# Patient Record
Sex: Female | Born: 1970 | Race: White | Hispanic: No | Marital: Married | Smoking: Former smoker
Health system: Southern US, Community
[De-identification: ages and names within clinical notes are randomized; demographics above are authoritative.]

## PROBLEM LIST (undated history)

## (undated) DIAGNOSIS — Z9889 Other specified postprocedural states: Secondary | ICD-10-CM

## (undated) DIAGNOSIS — J45909 Unspecified asthma, uncomplicated: Secondary | ICD-10-CM

## (undated) DIAGNOSIS — F419 Anxiety disorder, unspecified: Secondary | ICD-10-CM

## (undated) DIAGNOSIS — D0512 Intraductal carcinoma in situ of left breast: Secondary | ICD-10-CM

## (undated) DIAGNOSIS — Z923 Personal history of irradiation: Secondary | ICD-10-CM

## (undated) DIAGNOSIS — Z973 Presence of spectacles and contact lenses: Secondary | ICD-10-CM

## (undated) DIAGNOSIS — R112 Nausea with vomiting, unspecified: Secondary | ICD-10-CM

## (undated) DIAGNOSIS — G2581 Restless legs syndrome: Secondary | ICD-10-CM

## (undated) DIAGNOSIS — F411 Generalized anxiety disorder: Secondary | ICD-10-CM

## (undated) DIAGNOSIS — G43909 Migraine, unspecified, not intractable, without status migrainosus: Secondary | ICD-10-CM

## (undated) DIAGNOSIS — IMO0002 Reserved for concepts with insufficient information to code with codable children: Secondary | ICD-10-CM

## (undated) DIAGNOSIS — R55 Syncope and collapse: Secondary | ICD-10-CM

## (undated) DIAGNOSIS — M2550 Pain in unspecified joint: Secondary | ICD-10-CM

## (undated) DIAGNOSIS — T7840XA Allergy, unspecified, initial encounter: Secondary | ICD-10-CM

## (undated) HISTORY — DX: Restless legs syndrome: G25.81

## (undated) HISTORY — DX: Allergy, unspecified, initial encounter: T78.40XA

## (undated) HISTORY — PX: BREAST EXCISIONAL BIOPSY: SUR124

## (undated) HISTORY — PX: KNEE ARTHROSCOPY: SUR90

## (undated) HISTORY — PX: WISDOM TOOTH EXTRACTION: SHX21

## (undated) HISTORY — PX: BREAST LUMPECTOMY: SHX2

## (undated) HISTORY — PX: MM BREAST STEREO BIOPSY LEFT (ARMC HX): HXRAD1824

## (undated) HISTORY — DX: Anxiety disorder, unspecified: F41.9

## (undated) HISTORY — PX: KNEE SURGERY: SHX244

## (undated) HISTORY — DX: Reserved for concepts with insufficient information to code with codable children: IMO0002

---

## 1988-07-09 HISTORY — PX: ANTERIOR CRUCIATE LIGAMENT REPAIR: SHX115

## 1997-12-14 ENCOUNTER — Other Ambulatory Visit: Admission: RE | Admit: 1997-12-14 | Discharge: 1997-12-14 | Payer: Self-pay | Admitting: Obstetrics and Gynecology

## 1998-08-30 ENCOUNTER — Other Ambulatory Visit: Admission: RE | Admit: 1998-08-30 | Discharge: 1998-08-30 | Payer: Self-pay | Admitting: Obstetrics and Gynecology

## 1999-09-01 ENCOUNTER — Other Ambulatory Visit: Admission: RE | Admit: 1999-09-01 | Discharge: 1999-09-01 | Payer: Self-pay | Admitting: Gynecology

## 2000-09-12 ENCOUNTER — Other Ambulatory Visit: Admission: RE | Admit: 2000-09-12 | Discharge: 2000-09-12 | Payer: Self-pay | Admitting: Gynecology

## 2001-09-12 ENCOUNTER — Other Ambulatory Visit: Admission: RE | Admit: 2001-09-12 | Discharge: 2001-09-12 | Payer: Self-pay | Admitting: Gynecology

## 2002-02-02 ENCOUNTER — Other Ambulatory Visit: Admission: RE | Admit: 2002-02-02 | Discharge: 2002-02-23 | Payer: Self-pay | Admitting: Obstetrics and Gynecology

## 2002-03-26 ENCOUNTER — Encounter: Payer: Self-pay | Admitting: Emergency Medicine

## 2002-03-26 ENCOUNTER — Emergency Department (HOSPITAL_COMMUNITY): Admission: AC | Admit: 2002-03-26 | Discharge: 2002-03-27 | Payer: Self-pay

## 2002-06-24 ENCOUNTER — Ambulatory Visit (HOSPITAL_COMMUNITY): Admission: RE | Admit: 2002-06-24 | Discharge: 2002-06-24 | Payer: Self-pay | Admitting: Gynecology

## 2002-08-13 ENCOUNTER — Encounter (INDEPENDENT_AMBULATORY_CARE_PROVIDER_SITE_OTHER): Payer: Self-pay

## 2002-08-13 ENCOUNTER — Inpatient Hospital Stay (HOSPITAL_COMMUNITY): Admission: AD | Admit: 2002-08-13 | Discharge: 2002-08-15 | Payer: Self-pay | Admitting: Gynecology

## 2002-09-24 ENCOUNTER — Other Ambulatory Visit: Admission: RE | Admit: 2002-09-24 | Discharge: 2002-09-24 | Payer: Self-pay | Admitting: *Deleted

## 2003-10-08 ENCOUNTER — Other Ambulatory Visit: Admission: RE | Admit: 2003-10-08 | Discharge: 2003-10-08 | Payer: Self-pay | Admitting: Gynecology

## 2004-10-09 ENCOUNTER — Other Ambulatory Visit: Admission: RE | Admit: 2004-10-09 | Discharge: 2004-10-09 | Payer: Self-pay | Admitting: Gynecology

## 2005-08-14 ENCOUNTER — Other Ambulatory Visit: Admission: RE | Admit: 2005-08-14 | Discharge: 2005-08-14 | Payer: Self-pay | Admitting: Gynecology

## 2006-03-04 ENCOUNTER — Encounter (INDEPENDENT_AMBULATORY_CARE_PROVIDER_SITE_OTHER): Payer: Self-pay | Admitting: Specialist

## 2006-03-04 ENCOUNTER — Inpatient Hospital Stay (HOSPITAL_COMMUNITY): Admission: AD | Admit: 2006-03-04 | Discharge: 2006-03-06 | Payer: Self-pay | Admitting: Gynecology

## 2006-04-15 ENCOUNTER — Other Ambulatory Visit: Admission: RE | Admit: 2006-04-15 | Discharge: 2006-04-15 | Payer: Self-pay | Admitting: Gynecology

## 2007-04-21 ENCOUNTER — Other Ambulatory Visit: Admission: RE | Admit: 2007-04-21 | Discharge: 2007-04-21 | Payer: Self-pay | Admitting: Gynecology

## 2008-04-26 ENCOUNTER — Ambulatory Visit: Payer: Self-pay | Admitting: Women's Health

## 2008-04-26 ENCOUNTER — Other Ambulatory Visit: Admission: RE | Admit: 2008-04-26 | Discharge: 2008-04-26 | Payer: Self-pay | Admitting: Gynecology

## 2008-04-26 ENCOUNTER — Encounter: Payer: Self-pay | Admitting: Women's Health

## 2009-05-27 ENCOUNTER — Other Ambulatory Visit: Admission: RE | Admit: 2009-05-27 | Discharge: 2009-05-27 | Payer: Self-pay | Admitting: Gynecology

## 2009-05-27 ENCOUNTER — Ambulatory Visit: Payer: Self-pay | Admitting: Women's Health

## 2009-11-16 ENCOUNTER — Encounter: Admission: RE | Admit: 2009-11-16 | Discharge: 2009-11-16 | Payer: Self-pay | Admitting: Gynecology

## 2010-06-16 ENCOUNTER — Ambulatory Visit: Payer: Self-pay | Admitting: Women's Health

## 2010-06-16 ENCOUNTER — Other Ambulatory Visit
Admission: RE | Admit: 2010-06-16 | Discharge: 2010-06-16 | Payer: Self-pay | Source: Home / Self Care | Admitting: Gynecology

## 2010-10-23 ENCOUNTER — Other Ambulatory Visit: Payer: Self-pay | Admitting: Obstetrics and Gynecology

## 2010-10-23 DIAGNOSIS — Z1231 Encounter for screening mammogram for malignant neoplasm of breast: Secondary | ICD-10-CM

## 2010-10-26 ENCOUNTER — Other Ambulatory Visit: Payer: Self-pay | Admitting: Gynecology

## 2010-10-26 DIAGNOSIS — Z139 Encounter for screening, unspecified: Secondary | ICD-10-CM

## 2010-11-21 ENCOUNTER — Ambulatory Visit (HOSPITAL_COMMUNITY)
Admission: RE | Admit: 2010-11-21 | Discharge: 2010-11-21 | Disposition: A | Discharge status: Home / Self Care | Payer: BC Managed Care – PPO | Source: Ambulatory Visit | Attending: Gynecology | Admitting: Gynecology

## 2010-11-21 DIAGNOSIS — Z1231 Encounter for screening mammogram for malignant neoplasm of breast: Secondary | ICD-10-CM | POA: Insufficient documentation

## 2010-11-21 DIAGNOSIS — Z139 Encounter for screening, unspecified: Secondary | ICD-10-CM

## 2010-11-24 NOTE — H&P (Signed)
Pamela Martinez, Pamela Martinez              ACCOUNT NO.:  [id number]   MEDICAL RECORD NO.:  [id number]          PATIENT TYPE:  INP   LOCATION:  9164                          FACILITY:  [facility]   PHYSICIAN:  Ivor Costa. Farrel Gobble, M.D. DATE OF BIRTH:  06/12/1971   DATE OF ADMISSION:  03/04/2006  DATE OF DISCHARGE:                                HISTORY & PHYSICAL   CHIEF COMPLAINT:  Labor.   HISTORY OF PRESENT ILLNESS:  The patient is a 40 year old G2, P8 with  estimated date of confinement of March 07, 2006. Estimated gestational age  of 39-4/7 weeks, who presented to the office complaining of contractions for  the past hour and a half. The patient states that they are about every 2  minutes. Questionable leakage of fluid but no gush. Good fetal movement. No  vaginal bleeding. On speculum examination, a bulging bad was seen, however,  no pooling. She was Nitrazine negative but was slightly firm positive. She  was noted to be 2 to 3, 100%, minus 1, where her prior examination had been  1, 70, and minus 3. Based on that, the patient was admitted to labor and  delivery for management of active labor. Her pregnancy is complicated by  advanced maternal age. The patient elected not to have an amniocentesis. She  was also noted to be GBS positive. She is O positive, antibiotic negative,  RPR non-reactive, Rubella immune, hepatitis B surface antigen non-reactive.  HIV non-reactive. GBS positive. Refer to the [place].   PHYSICAL EXAMINATION:  GENERAL:  She is uncomfortable, gravida, in moderate  distress.  HEART:  Regular rate.  LUNGS:  Clear.  ABDOMEN:  Gravid with moderate contractions. Fetal heart tones were  auscultated.  PELVIC:  Vaginal examination, 2 to 3, 100%, minus 1, with a bag of water.  EXTREMITIES:  Negative.   ASSESSMENT:  Labor.   RECOMMENDATIONS:  The patient was directly admitted to labor and delivery  with orders for epidural p.r.n. Nubain p.r.n. and GBS prophylaxis as well as  routine orders were given.      Ivor Costa. Farrel Gobble, M.D.  Electronically Signed     THL/MEDQ  D:  03/04/2006  T:  03/04/2006  Job:  [id number]

## 2010-11-24 NOTE — H&P (Signed)
   NAME:  Pamela Martinez, Pamela Martinez                        ACCOUNT NO.:  [id number]   MEDICAL RECORD NO.:  [id number]                   PATIENT TYPE:   LOCATION:                                       FACILITY:   PHYSICIAN:  Katy Fitch, M.D.               DATE OF BIRTH:  [birth year]   DATE OF ADMISSION:  DATE OF DISCHARGE:                                HISTORY & PHYSICAL   CHIEF COMPLAINT:  Ruptured membranes.   HISTORY OF PRESENT ILLNESS:  The patient is a 40 year old G1 P0 at 37 weeks  and four days who presents to the office for a routine appointment  complaining of a gush of fluid.  The patient denies any vaginal bleeding,  fevers, nausea, vomiting, chills, change in bowel or bladder habits.  The  patient has good fetal movement.  Upon examination the patient has gross  amniotic fluid coming from the cervix which appeared to be closed and thick,  and good fetal heart tones were noted.   PAST MEDICAL HISTORY:  None.   PAST SURGICAL HISTORY:  None.   MEDICATIONS:  Prenatal vitamins.   ALLERGIES:  No known drug allergies.   FAMILY HISTORY:  Without mental retardation or epithelial cancers.   PHYSICAL EXAMINATION:  VITAL SIGNS:  Blood pressure 122/70.  HEENT:  Throat clear.  LUNGS:  Clear to auscultation bilaterally.  HEART:  Regular rate and rhythm.  ABDOMEN:  Gravid, nontender.  Estimated fetal weight 7 pounds 2 ounces.  PELVIC:  Cervix closed, thick, and high.  Positive GSB.   ASSESSMENT AND PLAN:  A 40 year old G1 P0 at 45 and four with ruptured  membranes.  Will send her over to the hospital for observation.  The patient  will be started on penicillin for GBS and then started on Pitocin if she  does not go into labor within 12-24 hours.                                               Katy Fitch, M.D.    DC/MEDQ  D:  08/13/2002  T:  08/13/2002  Job:  [id number]

## 2010-11-24 NOTE — Discharge Summary (Signed)
   NAMERYLYNNE, Pamela Martinez                        ACCOUNT NO.:  [id number]   MEDICAL RECORD NO.:  [id number]                   PATIENT TYPE:  OUT   LOCATION:  VASC                                 FACILITY:  [facility]   PHYSICIAN:  Ivor Costa. Farrel Gobble, M.D.              DATE OF BIRTH:  30-Dec-1970   DATE OF ADMISSION:  08/13/2002  DATE OF DISCHARGE:  08/15/2002                                 DISCHARGE SUMMARY   DISCHARGE DIAGNOSES:  1. Intrauterine pregnancy at 37 weeks, delivered.  Status post spontaneous     vaginal delivery.  2. Positive Group B Strep.   HISTORY OF PRESENT ILLNESS:  The patient is a 40 year old female, gravida 1,  para 0, with an EDC of 08/30/02.  Prenatal course had been complicated by the  patient being 32 at the time of delivery, but the father of the baby being  27.  The patient deferred amniocentesis.  Positive Group B Strep.  The  patient was seen in the office on the day of admission and diagnosed with  rupture of membranes at 37-4/7 weeks, and was sent to [facility].   HOSPITAL COURSE:  The patient was admitted on 08/13/02, for rupture of  membranes.  Put on IV penicillin for Group B Strep prophylaxis.  On 08/13/02,  at 2039, underwent delivery of a female with Agpars of 9 and 9, weight of 7  pounds 4 ounces.  There were no complications.  Postpartum, the patient  remained afebrile, in stable condition.  She was discharged to home on  08/15/02, with ______ postpartum booklet.   LABORATORY DATA:  The patient is O positive, rubella immune, hemoglobin on  08/14/02, was 7.9.   DISPOSITION:  The patient is discharged to home.  Return in six weeks.  If  any problems prior to that time, to see me in the office.   DISCHARGE MEDICATIONS:  Percocet p.r.n. pain.      Susa Loffler, P.A.                    Ivor Costa. Farrel Gobble, M.D.    TSG/MEDQ  D:  09/11/2002  T:  09/12/2002  Job:  [id number]

## 2011-01-18 ENCOUNTER — Ambulatory Visit (INDEPENDENT_AMBULATORY_CARE_PROVIDER_SITE_OTHER): Payer: BC Managed Care – PPO | Admitting: Gynecology

## 2011-01-18 DIAGNOSIS — R5381 Other malaise: Secondary | ICD-10-CM

## 2011-01-18 DIAGNOSIS — L659 Nonscarring hair loss, unspecified: Secondary | ICD-10-CM

## 2011-01-18 DIAGNOSIS — N951 Menopausal and female climacteric states: Secondary | ICD-10-CM

## 2011-01-18 DIAGNOSIS — N925 Other specified irregular menstruation: Secondary | ICD-10-CM

## 2011-01-18 DIAGNOSIS — N949 Unspecified condition associated with female genital organs and menstrual cycle: Secondary | ICD-10-CM

## 2011-01-23 ENCOUNTER — Encounter: Payer: Self-pay | Admitting: Family Medicine

## 2011-02-01 ENCOUNTER — Encounter: Payer: Self-pay | Admitting: Gynecology

## 2011-02-01 ENCOUNTER — Ambulatory Visit (INDEPENDENT_AMBULATORY_CARE_PROVIDER_SITE_OTHER): Payer: BC Managed Care – PPO | Admitting: Gynecology

## 2011-02-01 VITALS — BP 110/64

## 2011-02-01 DIAGNOSIS — Z30431 Encounter for routine checking of intrauterine contraceptive device: Secondary | ICD-10-CM

## 2011-02-01 MED ORDER — LEVONORGESTREL 20 MCG/24HR IU IUD: INTRAUTERINE_SYSTEM | Freq: Once | INTRAUTERINE | Status: AC

## 2011-02-01 NOTE — Progress Notes (Signed)
Patient presents for IUD removal and replacement as she is at 5 years with her Mirena. I reviewed her labs from her last visit which showed normal hormonal studies of TSH, FSH, prolactin and a normal comprehensive metabolic panel. She has read through the Mirena booklet signed the consent form which is scanned into EPIC.  I reviewed the removal and insertion process with her and the risks to include infection both short-term and long-term, uterine perforation or migration of the IUD requiring surgery to remove and the risk of failure with pregnancy all discussed understood and accepted.   Exam:  External BUS vagina: Normal Cervix: Normal IUD string visualized Uterus: Anteverted normal size shape contour midline mobile nontender Adnexa: Without masses or tenderness  Procedure: Cervix was visualized with a speculum and the Mirena IUD string was grasped with a [place] forceps and removed shown to the patient and discarded. The cervix was cleansed with Betadine solution grasped with a single tooth tenaculum, the uterus was sounded and a new Mirena was placed according to manufacturer's recommendation. The strings were trimmed.  Patient did have a mild vasovagal response after placement which resolved after lying down for 5 minutes. Patient was instructed to follow up with [place] in one month for postinsertional check and then in December when she is due for her annual.

## 2011-03-06 ENCOUNTER — Encounter: Payer: Self-pay | Admitting: Gynecology

## 2011-03-06 ENCOUNTER — Ambulatory Visit (INDEPENDENT_AMBULATORY_CARE_PROVIDER_SITE_OTHER): Payer: BC Managed Care – PPO | Admitting: Gynecology

## 2011-03-06 DIAGNOSIS — Z30431 Encounter for routine checking of intrauterine contraceptive device: Secondary | ICD-10-CM

## 2011-03-06 NOTE — Progress Notes (Signed)
Patient presents for postinsertional IUD check. She is doing well without complaints.  Exam Pelvic external BUS vagina normal, cervix normal IUD string visualized, uterus anteverted normal size midline mobile nontender, adnexa without masses or tenderness  Assessment and plan: Post IUD insertion check doing well no complaints we'll followup in December which is due for her annual.

## 2011-06-18 ENCOUNTER — Ambulatory Visit (INDEPENDENT_AMBULATORY_CARE_PROVIDER_SITE_OTHER): Payer: BC Managed Care – PPO | Admitting: Women's Health

## 2011-06-18 ENCOUNTER — Encounter: Payer: Self-pay | Admitting: Women's Health

## 2011-06-18 ENCOUNTER — Other Ambulatory Visit (HOSPITAL_COMMUNITY)
Admission: RE | Admit: 2011-06-18 | Discharge: 2011-06-18 | Disposition: A | Discharge status: Home / Self Care | Payer: BC Managed Care – PPO | Source: Ambulatory Visit | Attending: Women's Health | Admitting: Women's Health

## 2011-06-18 VITALS — BP 120/78 | Ht 64.5 in | Wt 164.0 lb

## 2011-06-18 DIAGNOSIS — N951 Menopausal and female climacteric states: Secondary | ICD-10-CM

## 2011-06-18 DIAGNOSIS — Z01419 Encounter for gynecological examination (general) (routine) without abnormal findings: Secondary | ICD-10-CM

## 2011-06-18 DIAGNOSIS — R87810 Cervical high risk human papillomavirus (HPV) DNA test positive: Secondary | ICD-10-CM | POA: Insufficient documentation

## 2011-06-18 MED ORDER — ESTRADIOL 0.025 MG/24HR TD PTWK: 1.0000 | MEDICATED_PATCH | TRANSDERMAL | Status: DC

## 2011-06-18 NOTE — Progress Notes (Signed)
Pamela Martinez 1970/08/27 [id number]    History:    The patient presents for annual exam.  Daughter Maralyn Sago 5, [name] 8, both doing well. Working for [employer]   Past medical history, past surgical history, family history and social history were all reviewed and documented in the EPIC chart.   ROS:  A  ROS was performed and pertinent positives and negatives are included in the history.  Exam:  Filed Vitals:   06/18/11 0951  BP: 120/78    General appearance:  Normal Head/Neck:  Normal, without cervical or supraclavicular adenopathy. Thyroid:  Symmetrical, normal in size, without palpable masses or nodularity. Respiratory  Effort:  Normal  Auscultation:  Clear without wheezing or rhonchi Cardiovascular  Auscultation:  Regular rate, without rubs, murmurs or gallops  Edema/varicosities:  Not grossly evident Abdominal  Soft,nontender, without masses, guarding or rebound.  Liver/spleen:  No organomegaly noted  Hernia:  None appreciated  Skin  Inspection:  Grossly normal  Palpation:  Grossly normal Neurologic/psychiatric  Orientation:  Normal with appropriate conversation.  Mood/affect:  Normal  Genitourinary    Breasts: Examined lying and sitting.     Right: Without masses, retractions, discharge or axillary adenopathy.     Left: Without masses, retractions, discharge or axillary adenopathy.   Inguinal/mons:  Normal without inguinal adenopathy  External genitalia:  Normal  BUS/Urethra/Skene's glands:  Normal  Bladder:  Normal  Vagina:  Normal  Cervix:  Normal/IUD strings visible  Uterus:   normal in size, shape and contour.  Midline and mobile  Adnexa/parametria:     Rt: Without masses or tenderness.   Lt: Without masses or tenderness.  Anus and perineum: Normal  Digital rectal exam: Normal sphincter tone without palpated masses or tenderness  Assessment/Plan:  40 y.o. MWF G2 P2 for annual exam. States has had problems with increased hot flushes, mood swings and  generally not feeling as well as she used to. Had a normal TSH prolactin and FSH this past summer. Has irregular periods lasting 5 days/Mirena IUD placed July of 2012. History of ascus in 99 normal Paps after. Normal mammogram.  Mirena IUD/2012 Perimenopausal symptoms  Plan: No noted health risks for estrogen. Will try Climara 0.025 patch weekly, instructed to call if no relief of symptoms. Prescription, slight risk for blood clots and strokes, proper use reviewed. SBEs, annual mammogram, increase exercise and decrease calories for weight loss. Has gained about 10 pounds in the past year. Calcium rich diet, vitamin D 1000 daily encouraged. CBC, UA and Pap.    Harrington Challenger [facility], 1:39 PM 06/18/2011

## 2011-08-09 ENCOUNTER — Ambulatory Visit (INDEPENDENT_AMBULATORY_CARE_PROVIDER_SITE_OTHER): Payer: BC Managed Care – PPO | Admitting: Gynecology

## 2011-08-09 ENCOUNTER — Encounter: Payer: Self-pay | Admitting: Gynecology

## 2011-08-09 VITALS — BP 112/70

## 2011-08-09 DIAGNOSIS — R87610 Atypical squamous cells of undetermined significance on cytologic smear of cervix (ASC-US): Secondary | ICD-10-CM

## 2011-08-09 DIAGNOSIS — R87810 Cervical high risk human papillomavirus (HPV) DNA test positive: Secondary | ICD-10-CM

## 2011-08-09 NOTE — Progress Notes (Signed)
Patient ID: Pamela Martinez, female   DOB: 19-Apr-1971, 41 y.o.   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 She does have a history of ASCUS in 1999 and reports an "abnormal" Pap smear before starting in our practice although has never had any treatment.  Exam with Selena Batten chaperone present Pelvic external BUS vagina normal. Cervix grossly normal with IUD string visualized. Uterus normal size midline mobile nontender. Adnexa without masses or tenderness.  Colposcopy adequate after acetic acid cleanse clear transformation zone visualized with no abnormalities. ECC performed.  Physical Exam  Genitourinary:      Assessment and plan: ASCUS Pap smear with positive high-risk HPV screen. Colposcopy adequate normal. ECC performed. Patient will call for results. If negative then planned six-month repeat Pap smear. The issues of dysplasia, high grade low-grade, progression regression, association with high-risk HPV all discussed. She knows the importance of follow up.

## 2011-08-09 NOTE — Patient Instructions (Signed)
Office will call with biopsy results.  If negative, follow up for Pap smear in 6 months.  If otherwise will discuss on the phone.

## 2011-12-06 ENCOUNTER — Other Ambulatory Visit: Payer: Self-pay | Admitting: Women's Health

## 2011-12-06 DIAGNOSIS — Z139 Encounter for screening, unspecified: Secondary | ICD-10-CM

## 2011-12-10 ENCOUNTER — Ambulatory Visit (HOSPITAL_COMMUNITY)
Admission: RE | Admit: 2011-12-10 | Discharge: 2011-12-10 | Disposition: A | Discharge status: Home / Self Care | Payer: BC Managed Care – PPO | Source: Ambulatory Visit | Attending: Women's Health | Admitting: Women's Health

## 2011-12-10 DIAGNOSIS — Z1231 Encounter for screening mammogram for malignant neoplasm of breast: Secondary | ICD-10-CM | POA: Insufficient documentation

## 2011-12-10 DIAGNOSIS — Z139 Encounter for screening, unspecified: Secondary | ICD-10-CM

## 2012-03-17 ENCOUNTER — Encounter: Payer: Self-pay | Admitting: Women's Health

## 2012-03-17 ENCOUNTER — Ambulatory Visit (INDEPENDENT_AMBULATORY_CARE_PROVIDER_SITE_OTHER): Payer: BC Managed Care – PPO | Admitting: Women's Health

## 2012-03-17 DIAGNOSIS — N870 Mild cervical dysplasia: Secondary | ICD-10-CM

## 2012-03-17 DIAGNOSIS — F419 Anxiety disorder, unspecified: Secondary | ICD-10-CM

## 2012-03-17 DIAGNOSIS — N951 Menopausal and female climacteric states: Secondary | ICD-10-CM

## 2012-03-17 DIAGNOSIS — F411 Generalized anxiety disorder: Secondary | ICD-10-CM

## 2012-03-17 MED ORDER — ESTRADIOL 0.05 MG/24HR TD PTWK: 1.0000 | MEDICATED_PATCH | TRANSDERMAL | Status: DC

## 2012-03-17 MED ORDER — ALPRAZOLAM 0.25 MG PO TABS: 0.2500 mg | ORAL_TABLET | Freq: Every evening | ORAL | Status: AC | PRN

## 2012-03-17 NOTE — Progress Notes (Signed)
Patient ID: EDLA PARA, female   DOB: Nov 29, 1970, 41 y.o.   MRN: [id number] Presents for a repeat Pap. History of ascus with positive HR HPV 06/2011, negative colposcopy and ECC 1/ 2012 . Also having problems with increased hot flushes, using Climara patch 0.025 weekly, had excellent relief from hot flushes for several months and is now having poor sleep related to increased hot flushes and restlessness. Has lost approximately 15 pounds with diet and exercise. Weight today 155. Mirena IUD placed 01/2011.  Exam: External genitalia within normal limits, speculum exam cervix pink, IUD strings visible, repeat Pap taken. Bimanual no CMT or adnexal fullness or tenderness.  Perimenopausal hot flushes Mild dysplasia  Plan: Will try Climara patch 0.05 weekly, prescription, proper use, instructed to call if no relief. Xanax 0.25 at bedtime when necessary for restlessness. Continue healthy diet and exercise. Triage based on Pap results, if normal resume annual.

## 2012-03-18 ENCOUNTER — Other Ambulatory Visit (HOSPITAL_COMMUNITY)
Admission: RE | Admit: 2012-03-18 | Discharge: 2012-03-18 | Disposition: A | Discharge status: Home / Self Care | Payer: BC Managed Care – PPO | Source: Ambulatory Visit | Attending: Obstetrics and Gynecology | Admitting: Obstetrics and Gynecology

## 2012-03-18 DIAGNOSIS — Z01419 Encounter for gynecological examination (general) (routine) without abnormal findings: Secondary | ICD-10-CM | POA: Insufficient documentation

## 2012-03-18 NOTE — Addendum Note (Signed)
Addended by: Dayna Barker on: 03/18/2012 09:09 AM   Modules accepted: Orders

## 2012-04-03 ENCOUNTER — Telehealth: Payer: Self-pay | Admitting: *Deleted

## 2012-04-03 NOTE — Telephone Encounter (Signed)
Pt informed of pap results on 03/18/12.

## 2012-12-17 ENCOUNTER — Telehealth: Payer: Self-pay | Admitting: Family Medicine

## 2012-12-17 MED ORDER — LEVOCETIRIZINE DIHYDROCHLORIDE 5 MG PO TABS: 5.0000 mg | ORAL_TABLET | Freq: Every evening | ORAL | Status: DC

## 2012-12-17 NOTE — Telephone Encounter (Signed)
Rx Refilled  

## 2012-12-25 ENCOUNTER — Other Ambulatory Visit: Payer: Self-pay | Admitting: Women's Health

## 2012-12-25 ENCOUNTER — Encounter: Payer: Self-pay | Admitting: Women's Health

## 2012-12-25 DIAGNOSIS — Z139 Encounter for screening, unspecified: Secondary | ICD-10-CM

## 2012-12-26 ENCOUNTER — Ambulatory Visit (HOSPITAL_COMMUNITY)
Admission: RE | Admit: 2012-12-26 | Discharge: 2012-12-26 | Disposition: A | Discharge status: Home / Self Care | Payer: BC Managed Care – PPO | Source: Ambulatory Visit | Attending: Women's Health | Admitting: Women's Health

## 2012-12-26 DIAGNOSIS — Z139 Encounter for screening, unspecified: Secondary | ICD-10-CM

## 2012-12-26 DIAGNOSIS — Z1231 Encounter for screening mammogram for malignant neoplasm of breast: Secondary | ICD-10-CM | POA: Insufficient documentation

## 2012-12-30 ENCOUNTER — Other Ambulatory Visit: Payer: Self-pay | Admitting: Women's Health

## 2012-12-30 DIAGNOSIS — R928 Other abnormal and inconclusive findings on diagnostic imaging of breast: Secondary | ICD-10-CM

## 2013-01-07 ENCOUNTER — Ambulatory Visit (HOSPITAL_COMMUNITY)
Admission: RE | Admit: 2013-01-07 | Discharge: 2013-01-07 | Disposition: A | Discharge status: Home / Self Care | Payer: BC Managed Care – PPO | Source: Ambulatory Visit | Attending: Women's Health | Admitting: Women's Health

## 2013-01-07 DIAGNOSIS — R928 Other abnormal and inconclusive findings on diagnostic imaging of breast: Secondary | ICD-10-CM

## 2013-01-16 ENCOUNTER — Other Ambulatory Visit (HOSPITAL_COMMUNITY)
Admission: RE | Admit: 2013-01-16 | Discharge: 2013-01-16 | Disposition: A | Discharge status: Home / Self Care | Payer: BC Managed Care – PPO | Source: Ambulatory Visit | Attending: Gynecology | Admitting: Gynecology

## 2013-01-16 ENCOUNTER — Encounter: Payer: Self-pay | Admitting: Women's Health

## 2013-01-16 ENCOUNTER — Ambulatory Visit (INDEPENDENT_AMBULATORY_CARE_PROVIDER_SITE_OTHER): Payer: BC Managed Care – PPO | Admitting: Women's Health

## 2013-01-16 VITALS — BP 104/64 | Ht 65.0 in | Wt 160.0 lb

## 2013-01-16 DIAGNOSIS — N870 Mild cervical dysplasia: Secondary | ICD-10-CM

## 2013-01-16 DIAGNOSIS — Z13220 Encounter for screening for lipoid disorders: Secondary | ICD-10-CM

## 2013-01-16 DIAGNOSIS — Z01419 Encounter for gynecological examination (general) (routine) without abnormal findings: Secondary | ICD-10-CM | POA: Insufficient documentation

## 2013-01-16 DIAGNOSIS — Z833 Family history of diabetes mellitus: Secondary | ICD-10-CM

## 2013-01-16 LAB — URINALYSIS W MICROSCOPIC + REFLEX CULTURE
Bacteria, UA: NONE SEEN
Bilirubin Urine: NEGATIVE
Casts: NONE SEEN
Crystals: NONE SEEN
Glucose, UA: NEGATIVE mg/dL
Hgb urine dipstick: NEGATIVE
Ketones, ur: NEGATIVE mg/dL
Nitrite: NEGATIVE
Specific Gravity, Urine: 1.011 (ref 1.005–1.030)
pH: 7.5 (ref 5.0–8.0)

## 2013-01-16 LAB — CBC WITH DIFFERENTIAL/PLATELET
Basophils Absolute: 0.1 10*3/uL (ref 0.0–0.1)
HCT: 40 % (ref 36.0–46.0)
Lymphocytes Relative: 31 % (ref 12–46)
Neutro Abs: 3.6 10*3/uL (ref 1.7–7.7)
Platelets: 234 10*3/uL (ref 150–400)
RDW: 13.5 % (ref 11.5–15.5)
WBC: 5.9 10*3/uL (ref 4.0–10.5)

## 2013-01-16 LAB — LIPID PANEL
Cholesterol: 139 mg/dL (ref 0–200)
Total CHOL/HDL Ratio: 3.4 Ratio

## 2013-01-16 NOTE — Patient Instructions (Signed)

## 2013-01-16 NOTE — Addendum Note (Signed)
Addended by: WILKINSON, KARI S on: 01/16/2013 11:56 AM   Modules accepted: Orders  

## 2013-01-16 NOTE — Progress Notes (Signed)
Pamela Martinez Mar 17, 1971 [id number]    History:    The patient presents for annual exam.  Mirena IUD placed 05/2011 monthly spotting or 3-4 day cycle. Ascus 1999, ascus with a negative biopsy 2013. With normal Pap 03/2012. Mammogram normal after diagnostic 7/2/20014 breasts noted to be dense.   Past medical history, past surgical history, family history and social history were all reviewed and documented in the EPIC chart. Works for [employer]. Pamela Martinez 7, Pamela Martinez 4 both doing well. History of 3 left knee surgery, 1 right knee surgery.  ROS:  A  ROS was performed and pertinent positives and negatives are included in the history.  Exam:  Filed Vitals:   01/16/13 0803  BP: 104/64    General appearance:  Normal Head/Neck:  Normal, without cervical or supraclavicular adenopathy. Thyroid:  Symmetrical, normal in size, without palpable masses or nodularity. Respiratory  Effort:  Normal  Auscultation:  Clear without wheezing or rhonchi Cardiovascular  Auscultation:  Regular rate, without rubs, murmurs or gallops  Edema/varicosities:  Not grossly evident Abdominal  Soft,nontender, without masses, guarding or rebound.  Liver/spleen:  No organomegaly noted  Hernia:  None appreciated  Skin  Inspection:  Grossly normal  Palpation:  Grossly normal Neurologic/psychiatric  Orientation:  Normal with appropriate conversation.  Mood/affect:  Normal  Genitourinary    Breasts: Examined lying and sitting.     Right: Without masses, retractions, discharge or axillary adenopathy.     Left: Without masses, retractions, discharge or axillary adenopathy.   Inguinal/mons:  Normal without inguinal adenopathy  External genitalia:  Normal  BUS/Urethra/Skene's glands:  Normal  Bladder:  Normal  Vagina:  Normal  Cervix:  Normal IUD strings visible  Uterus:  normal in size, shape and contour.  Midline and mobile  Adnexa/parametria:     Rt: Without masses or tenderness.   Lt: Without masses or  tenderness.  Anus and perineum: Normal  Digital rectal exam: Normal sphincter tone without palpated masses or tenderness  Assessment/Plan:  42 y.o. M. WF G2 P2  for annual exam with no complaints.  Ascus with HR HPV 06/2011 with benign biopsy 07/2011 Mirena IUD placed 01/2011  Plan: SBE's, continue annual mammogram, 3D tomography reviewed and encouraged due to density of breast. Continue regular exercise, healthy diet. Vitamin D 1000 daily encouraged. CBC, glucose, lipid panel, UA, Pap.     Harrington Challenger [facility], 9:07 AM 01/16/2013

## 2013-05-10 ENCOUNTER — Ambulatory Visit (INDEPENDENT_AMBULATORY_CARE_PROVIDER_SITE_OTHER): Payer: BC Managed Care – PPO | Admitting: Emergency Medicine

## 2013-05-10 VITALS — BP 128/82 | HR 62 | Temp 98.0°F | Resp 17 | Ht 65.5 in | Wt 166.0 lb

## 2013-05-10 DIAGNOSIS — J018 Other acute sinusitis: Secondary | ICD-10-CM

## 2013-05-10 MED ORDER — AMOXICILLIN-POT CLAVULANATE 875-125 MG PO TABS: 1.0000 | ORAL_TABLET | Freq: Two times a day (BID) | ORAL | Status: DC

## 2013-05-10 MED ORDER — PSEUDOEPHEDRINE-GUAIFENESIN ER 60-600 MG PO TB12: 1.0000 | ORAL_TABLET | Freq: Two times a day (BID) | ORAL | Status: DC

## 2013-05-10 NOTE — Progress Notes (Signed)
[facility] [address], [place] [address] [phone]  Date:  05/10/2013   Name:  Pamela Martinez   DOB:  Jul 24, 1970   MRN:  [id number]  PCP:  Leo Grosser, MD    Chief Complaint: Cough and Sore Throat   History of Present Illness:  Pamela Martinez is a 42 y.o. very pleasant female patient who presents with the following:  Has a sore throat, post nasal drainage and a productive cough.  No wheezing or shortness of breath.  No nausea or vomiting.  No rash or stool change.  No headache.  Ill 5 days.  No improvement with over the counter medications or other home remedies. Denies other complaint or health concern today.   Patient Active Problem List   Diagnosis Date Noted  . 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 Allergy   . ASCUS with positive high risk HPV 07/2011    colposcopy with ECC    Past Surgical History  Procedure Laterality Date  . Knee surgery      X 4 = 1X RIGHT 3X LEFT/3 ARTHROSCOPIES AND 1 ACL  . Knee surgery    . Mirena  02/03/2011    History  Substance Use Topics  . Smoking status: Former Smoker    Types: Cigarettes    Quit date: 07/09/1988  . Smokeless tobacco: Never Used  . Alcohol Use: No    Family History  Problem Relation Age of Onset  . Cancer Father     THROAT  . Hyperlipidemia Father   . Cancer Paternal Aunt     PRIMARY PERITONEAL CANCER  . Hypertension Paternal Grandfather   . Cancer Paternal Grandfather     COLON  . Hyperlipidemia Brother   . Hypertension Brother     Allergies  Allergen Reactions  . Cholestatin     Medication list has been reviewed and updated.  Current Outpatient Prescriptions on File Prior to Visit  Medication Sig Dispense Refill  . levocetirizine (XYZAL) 5 MG tablet Take 1 tablet (5 mg total) by mouth every evening.  30 tablet  11  . mometasone (NASONEX) 50 MCG/ACT nasal spray Place 2 sprays into the nose daily.        .  [DISCONTINUED] levonorgestrel (MIRENA) 20 MCG/24HR IUD 1 each by Intrauterine route once. INSERTED 05-01-06.        Current Facility-Administered Medications on File Prior to Visit  Medication Dose Route Frequency Provider Last Rate Last Dose  . levonorgestrel (MIRENA) 20 MCG/24HR IUD   Intrauterine Once Dara Lords, MD        Review of Systems:  As per HPI, otherwise negative.    Physical Examination: Filed Vitals:   05/10/13 1121  BP: 128/82  Pulse: 62  Temp: 98 F (36.7 C)  Resp: 17   Filed Vitals:   05/10/13 1121  Height: 5' 5.5" (1.664 m)  Weight: 166 lb (75.297 kg)   Body mass index is 27.19 kg/(m^2). Ideal Body Weight: Weight in (lb) to have BMI = 25: 152.2  GEN: WDWN, NAD, Non-toxic, A & O x 3 HEENT: Atraumatic, Normocephalic. Neck supple. No masses, No LAD. Ears and Nose: No external deformity. CV: RRR, No M/G/R. No JVD. No thrill. No extra heart sounds. PULM: CTA B, no wheezes, crackles, rhonchi. No retractions. No resp. distress. No accessory muscle use. ABD: S, NT, ND, +BS. No rebound. No HSM. EXTR: No c/c/e NEURO Normal gait.  PSYCH: Normally interactive. Conversant. Not depressed or anxious appearing.  Calm demeanor.    Assessment and Plan: Sinusitis augmentin mucinex d  Signed,  Phillips Odor, MD

## 2013-05-10 NOTE — Patient Instructions (Signed)
brBronchitis Bronchitis is the body's way of reacting to injury and/or infection (inflammation) of the bronchi. Bronchi are the air tubes that extend from the windpipe into the lungs. If the inflammation becomes severe, it may cause shortness of breath. CAUSES  Inflammation may be caused by:  A virus.  Germs (bacteria).  Dust.  Allergens.  Pollutants and many other irritants. The cells lining the bronchial tree are covered with tiny hairs (cilia). These constantly beat upward, away from the lungs, toward the mouth. This keeps the lungs free of pollutants. When these cells become too irritated and are unable to do their job, mucus begins to develop. This causes the characteristic cough of bronchitis. The cough clears the lungs when the cilia are unable to do their job. Without either of these protective mechanisms, the mucus would settle in the lungs. Then you would develop pneumonia. Smoking is a common cause of bronchitis and can contribute to pneumonia. Stopping this habit is the single most important thing you can do to help yourself. TREATMENT   Your caregiver may prescribe an antibiotic if the cough is caused by bacteria. Also, medicines that open up your airways make it easier to breathe. Your caregiver may also recommend or prescribe an expectorant. It will loosen the mucus to be coughed up. Only take over-the-counter or prescription medicines for pain, discomfort, or fever as directed by your caregiver.  Removing whatever causes the problem (smoking, for example) is critical to preventing the problem from getting worse.  Cough suppressants may be prescribed for relief of cough symptoms.  Inhaled medicines may be prescribed to help with symptoms now and to help prevent problems from returning.  For those with recurrent (chronic) bronchitis, there may be a need for steroid medicines. SEEK IMMEDIATE MEDICAL CARE IF:   During treatment, you develop more pus-like mucus (purulent  sputum).  You have a fever.  Your baby is older than 3 months with a rectal temperature of 102 F (38.9 C) or higher.  Your baby is 61 months old or younger with a rectal temperature of 100.4 F (38 C) or higher.  You become progressively more ill.  You have increased difficulty breathing, wheezing, or shortness of breath. It is necessary to seek immediate medical care if you are elderly or sick from any other disease. MAKE SURE YOU:   Understand these instructions.  Will watch your condition.  Will get help right away if you are not doing well or get worse. Document Released: 06/25/2005 Document Revised: 09/17/2011 Document Reviewed: 05/04/2008 [facility] Patient Information 2014 [place], [place]. Sinusitis Sinusitis is redness, soreness, and swelling (inflammation) of the paranasal sinuses. Paranasal sinuses are air pockets within the bones of your face (beneath the eyes, the middle of the forehead, or above the eyes). In healthy paranasal sinuses, mucus is able to drain out, and air is able to circulate through them by way of your nose. However, when your paranasal sinuses are inflamed, mucus and air can become trapped. This can allow bacteria and other germs to grow and cause infection. Sinusitis can develop quickly and last only a short time (acute) or continue over a long period (chronic). Sinusitis that lasts for more than 12 weeks is considered chronic.  CAUSES  Causes of sinusitis include:  Allergies.  Structural abnormalities, such as displacement of the cartilage that separates your nostrils (deviated septum), which can decrease the air flow through your nose and sinuses and affect sinus drainage.  Functional abnormalities, such as when the small hairs (cilia)  that line your sinuses and help remove mucus do not work properly or are not present. SYMPTOMS  Symptoms of acute and chronic sinusitis are the same. The primary symptoms are pain and pressure around the affected  sinuses. Other symptoms include:  Upper toothache.  Earache.  Headache.  Bad breath.  Decreased sense of smell and taste.  A cough, which worsens when you are lying flat.  Fatigue.  Fever.  Thick drainage from your nose, which often is green and may contain pus (purulent).  Swelling and warmth over the affected sinuses. DIAGNOSIS  Your caregiver will perform a physical exam. During the exam, your caregiver may:  Look in your nose for signs of abnormal growths in your nostrils (nasal polyps).  Tap over the affected sinus to check for signs of infection.  View the inside of your sinuses (endoscopy) with a special imaging device with a light attached (endoscope), which is inserted into your sinuses. If your caregiver suspects that you have chronic sinusitis, one or more of the following tests may be recommended:  Allergy tests.  Nasal culture A sample of mucus is taken from your nose and sent to a lab and screened for bacteria.  Nasal cytology A sample of mucus is taken from your nose and examined by your caregiver to determine if your sinusitis is related to an allergy. TREATMENT  Most cases of acute sinusitis are related to a viral infection and will resolve on their own within 10 days. Sometimes medicines are prescribed to help relieve symptoms (pain medicine, decongestants, nasal steroid sprays, or saline sprays).  However, for sinusitis related to a bacterial infection, your caregiver will prescribe antibiotic medicines. These are medicines that will help kill the bacteria causing the infection.  Rarely, sinusitis is caused by a fungal infection. In theses cases, your caregiver will prescribe antifungal medicine. For some cases of chronic sinusitis, surgery is needed. Generally, these are cases in which sinusitis recurs more than 3 times per year, despite other treatments. HOME CARE INSTRUCTIONS   Drink plenty of water. Water helps thin the mucus so your sinuses can drain  more easily.  Use a humidifier.  Inhale steam 3 to 4 times a day (for example, sit in the bathroom with the shower running).  Apply a warm, moist washcloth to your face 3 to 4 times a day, or as directed by your caregiver.  Use saline nasal sprays to help moisten and clean your sinuses.  Take over-the-counter or prescription medicines for pain, discomfort, or fever only as directed by your caregiver. SEEK IMMEDIATE MEDICAL CARE IF:  You have increasing pain or severe headaches.  You have nausea, vomiting, or drowsiness.  You have swelling around your face.  You have vision problems.  You have a stiff neck.  You have difficulty breathing. MAKE SURE YOU:   Understand these instructions.  Will watch your condition.  Will get help right away if you are not doing well or get worse. Document Released: 06/25/2005 Document Revised: 09/17/2011 Document Reviewed: 07/10/2011 [employer] Patient Information 2014 [employer].  

## 2013-05-14 ENCOUNTER — Other Ambulatory Visit: Payer: Self-pay

## 2013-08-16 ENCOUNTER — Ambulatory Visit (INDEPENDENT_AMBULATORY_CARE_PROVIDER_SITE_OTHER): Payer: BC Managed Care – PPO | Admitting: Family Medicine

## 2013-08-16 VITALS — BP 110/66 | HR 69 | Temp 97.8°F | Ht 64.75 in | Wt 162.2 lb

## 2013-08-16 DIAGNOSIS — N390 Urinary tract infection, site not specified: Secondary | ICD-10-CM

## 2013-08-16 DIAGNOSIS — R300 Dysuria: Secondary | ICD-10-CM

## 2013-08-16 LAB — POCT UA - MICROSCOPIC ONLY
Amorphous: POSITIVE
Casts, Ur, LPF, POC: NEGATIVE
Crystals, Ur, HPF, POC: NEGATIVE
Mucus, UA: NEGATIVE
Yeast, UA: NEGATIVE

## 2013-08-16 LAB — POCT URINALYSIS DIPSTICK
Bilirubin, UA: NEGATIVE
Glucose, UA: NEGATIVE
Ketones, UA: NEGATIVE
Nitrite, UA: NEGATIVE
Protein, UA: NEGATIVE
Spec Grav, UA: 1.02
Urobilinogen, UA: 0.2
pH, UA: 7

## 2013-08-16 MED ORDER — FLUCONAZOLE 150 MG PO TABS: 150.0000 mg | ORAL_TABLET | Freq: Once | ORAL | Status: DC

## 2013-08-16 MED ORDER — CIPROFLOXACIN HCL 500 MG PO TABS: 500.0000 mg | ORAL_TABLET | Freq: Two times a day (BID) | ORAL | Status: DC

## 2013-08-16 NOTE — Patient Instructions (Signed)
Urinary Tract Infection  Urinary tract infections (UTIs) can develop anywhere along your urinary tract. Your urinary tract is your body's drainage system for removing wastes and extra water. Your urinary tract includes two kidneys, two ureters, a bladder, and a urethra. Your kidneys are a pair of bean-shaped organs. Each kidney is about the size of your fist. They are located below your ribs, one on each side of your spine.  CAUSES  Infections are caused by microbes, which are microscopic organisms, including fungi, viruses, and bacteria. These organisms are so small that they can only be seen through a microscope. Bacteria are the microbes that most commonly cause UTIs.  SYMPTOMS   Symptoms of UTIs may vary by age and gender of the patient and by the location of the infection. Symptoms in young women typically include a frequent and intense urge to urinate and a painful, burning feeling in the bladder or urethra during urination. Older women and men are more likely to be tired, shaky, and weak and have muscle aches and abdominal pain. A fever may mean the infection is in your kidneys. Other symptoms of a kidney infection include pain in your back or sides below the ribs, nausea, and vomiting.  DIAGNOSIS  To diagnose a UTI, your caregiver will ask you about your symptoms. Your caregiver also will ask to provide a urine sample. The urine sample will be tested for bacteria and white blood cells. White blood cells are made by your body to help fight infection.  TREATMENT   Typically, UTIs can be treated with medication. Because most UTIs are caused by a bacterial infection, they usually can be treated with the use of antibiotics. The choice of antibiotic and length of treatment depend on your symptoms and the type of bacteria causing your infection.  HOME CARE INSTRUCTIONS   If you were prescribed antibiotics, take them exactly as your caregiver instructs you. Finish the medication even if you feel better after you  have only taken some of the medication.   Drink enough water and fluids to keep your urine clear or pale yellow.   Avoid caffeine, tea, and carbonated beverages. They tend to irritate your bladder.   Empty your bladder often. Avoid holding urine for long periods of time.   Empty your bladder before and after sexual intercourse.   After a bowel movement, women should cleanse from front to back. Use each tissue only once.  SEEK MEDICAL CARE IF:    You have back pain.   You develop a fever.   Your symptoms do not begin to resolve within 3 days.  SEEK IMMEDIATE MEDICAL CARE IF:    You have severe back pain or lower abdominal pain.   You develop chills.   You have nausea or vomiting.   You have continued burning or discomfort with urination.  MAKE SURE YOU:    Understand these instructions.   Will watch your condition.   Will get help right away if you are not doing well or get worse.  Document Released: 04/04/2005 Document Revised: 12/25/2011 Document Reviewed: 08/03/2011  [employer] Patient Information 2014 [employer].

## 2013-08-16 NOTE — Progress Notes (Signed)
Chief Complaint:  Chief Complaint  Patient presents with  . Dysuria    HPI: Pamela Martinez is a 43 y.o. female who is here for  UTI sxs, Has burning sensation when she urinates, like a "hot fire picker". Started this morning. She has had UTI before , last one was in Sept. Does not know her triggers.  She denies any n/v abd pain/back pain. Has not tried anything for this. Azo does not help. Pushing water.   Past Medical History  Diagnosis Date  . Allergy   . 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 Knee surgery      X 4 = 1X RIGHT 3X LEFT/3 ARTHROSCOPIES AND 1 ACL  . Knee surgery    . Mirena  02/03/2011   History   Social History  . Marital Status: Married    Spouse Name: N/A    Number of Children: N/A  . Years of Education: N/A   Social History Main Topics  . Smoking status: Former Smoker    Types: Cigarettes    Quit date: 07/09/1988  . Smokeless tobacco: Never Used  . Alcohol Use: No  . Drug Use: No  . Sexual Activity: Yes    Birth Control/ Protection: IUD   Other Topics Concern  . None   Social History Narrative  . None   Family History  Problem Relation Age of Onset  . Cancer Father     THROAT  . Hyperlipidemia Father   . Cancer Paternal Aunt     PRIMARY PERITONEAL CANCER  . Hypertension Paternal Grandfather   . Cancer Paternal Grandfather     COLON  . Hyperlipidemia Brother   . Hypertension Brother    Allergies  Allergen Reactions  . Cholestatin    Prior to Admission medications   Medication Sig Start Date End Date Taking? Authorizing Provider  fluticasone (FLONASE) 50 MCG/ACT nasal spray Place 2 sprays into both nostrils daily.   Yes Historical Provider, MD  levocetirizine (XYZAL) 5 MG tablet Take 1 tablet (5 mg total) by mouth every evening. 12/17/12  Yes Susy Frizzle, MD     ROS: The patient denies fevers, chills, night sweats, unintentional weight  loss, chest pain, palpitations, wheezing, dyspnea on exertion, nausea, vomiting, abdominal pain, hematuria, melena, numbness, weakness, or tingling.  All other systems have been reviewed and were otherwise negative with the exception of those mentioned in the HPI and as above.    PHYSICAL EXAM: Filed Vitals:   08/16/13 1035  BP: 110/66  Pulse: 69  Temp: 97.8 F (36.6 C)   Filed Vitals:   08/16/13 1035  Height: 5' 4.75" (1.645 m)  Weight: 162 lb 4 oz (73.596 kg)   Body mass index is 27.2 kg/(m^2).  General: Alert, no acute distress HEENT:  Normocephalic, atraumatic, oropharynx patent. EOMI, PERRLA Cardiovascular:  Regular rate and rhythm, no rubs murmurs or gallops.  No Carotid bruits, radial pulse intact. No pedal edema.  Respiratory: Clear to auscultation bilaterally.  No wheezes, rales, or rhonchi.  No cyanosis, no use of accessory musculature GI: No organomegaly, abdomen is soft and non-tender, positive bowel sounds.  No masses. Skin: No rashes. Neurologic: Facial musculature symmetric. Psychiatric: Patient is appropriate throughout our interaction. Lymphatic: No cervical lymphadenopathy Musculoskeletal: Gait intact. No CVA tenderness   LABS: Results for orders placed in visit on 08/16/13  POCT UA - MICROSCOPIC  ONLY      Result Value Range   WBC, Ur, HPF, POC 6-18     RBC, urine, microscopic 1-6     Bacteria, U Microscopic 1+     Mucus, UA neg     Epithelial cells, urine per micros 0-3     Crystals, Ur, HPF, POC neg     Casts, Ur, LPF, POC neg     Yeast, UA neg     Amorphous pos    POCT URINALYSIS DIPSTICK      Result Value Range   Color, UA yellow     Clarity, UA clear     Glucose, UA neg     Bilirubin, UA neg     Ketones, UA neg     Spec Grav, UA 1.020     Blood, UA trace-intact     pH, UA 7.0     Protein, UA neg     Urobilinogen, UA 0.2     Nitrite, UA neg     Leukocytes, UA small (1+)       EKG/XRAY:   Primary read interpreted by Dr. Marin Comment at  [facility].   ASSESSMENT/PLAN: Encounter Diagnoses  Name Primary?  . Dysuria Yes  . UTI (urinary tract infection)    Cipro 500 mg BID x 10 Days Dilfucan 150 mg for yeast infection prn Urine cx prn F/u prn  Gross sideeffects, risk and benefits, and alternatives of medications d/w patient. Patient is aware that all medications have potential sideeffects and we are unable to predict every sideeffect or drug-drug interaction that may occur.  LE, Grant, DO 08/16/2013 1:22 PM

## 2013-08-18 LAB — URINE CULTURE: Colony Count: 60000

## 2013-09-23 ENCOUNTER — Encounter: Payer: Self-pay | Admitting: General Practice

## 2013-09-23 ENCOUNTER — Ambulatory Visit (INDEPENDENT_AMBULATORY_CARE_PROVIDER_SITE_OTHER): Payer: BC Managed Care – PPO | Admitting: General Practice

## 2013-09-23 VITALS — BP 119/78 | HR 83 | Temp 97.8°F | Wt 162.2 lb

## 2013-09-23 DIAGNOSIS — L299 Pruritus, unspecified: Secondary | ICD-10-CM

## 2013-09-23 DIAGNOSIS — K648 Other hemorrhoids: Secondary | ICD-10-CM

## 2013-09-23 DIAGNOSIS — B3731 Acute candidiasis of vulva and vagina: Secondary | ICD-10-CM

## 2013-09-23 DIAGNOSIS — B373 Candidiasis of vulva and vagina: Secondary | ICD-10-CM

## 2013-09-23 DIAGNOSIS — K625 Hemorrhage of anus and rectum: Secondary | ICD-10-CM

## 2013-09-23 LAB — POCT CBC
GRANULOCYTE PERCENT: 69.4 % (ref 37–80)
HCT, POC: 38.1 % (ref 37.7–47.9)
Hemoglobin: 12.3 g/dL (ref 12.2–16.2)
LYMPH, POC: 1.5 (ref 0.6–3.4)
MCH, POC: 29.8 pg (ref 27–31.2)
MCHC: 32.2 g/dL (ref 31.8–35.4)
MCV: 92.5 fL (ref 80–97)
MPV: 7.9 fL (ref 0–99.8)
PLATELET COUNT, POC: 237 10*3/uL (ref 142–424)
POC GRANULOCYTE: 4.2 (ref 2–6.9)
POC LYMPH %: 23.9 % (ref 10–50)
RBC: 4.1 M/uL (ref 4.04–5.48)
RDW, POC: 13.3 %
WBC: 6.1 10*3/uL (ref 4.6–10.2)

## 2013-09-23 LAB — POCT WET PREP WITH KOH
CLUE CELLS WET PREP PER HPF POC: NEGATIVE
KOH Prep POC: POSITIVE
TRICHOMONAS UA: NEGATIVE

## 2013-09-23 MED ORDER — HYDROCORTISONE ACETATE 25 MG RE SUPP: 25.0000 mg | Freq: Two times a day (BID) | RECTAL | Status: DC

## 2013-09-23 MED ORDER — CLOTRIMAZOLE 1 % VA CREA: 1.0000 | TOPICAL_CREAM | Freq: Every day | VAGINAL | Status: AC

## 2013-09-23 NOTE — Patient Instructions (Addendum)
Hemorrhoids Hemorrhoids are swollen veins around the rectum or anus. There are two types of hemorrhoids:   Internal hemorrhoids. These occur in the veins just inside the rectum. They may poke through to the outside and become irritated and painful.  External hemorrhoids. These occur in the veins outside the anus and can be felt as a painful swelling or hard lump near the anus. CAUSES  Pregnancy.   Obesity.   Constipation or diarrhea.   Straining to have a bowel movement.   Sitting for long periods on the toilet.  Heavy lifting or other activity that caused you to strain.  Anal intercourse. SYMPTOMS   Pain.   Anal itching or irritation.   Rectal bleeding.   Fecal leakage.   Anal swelling.   One or more lumps around the anus.  DIAGNOSIS  Your caregiver may be able to diagnose hemorrhoids by visual examination. Other examinations or tests that may be performed include:   Examination of the rectal area with a gloved hand (digital rectal exam).   Examination of anal canal using a small tube (scope).   A blood test if you have lost a significant amount of blood.  A test to look inside the colon (sigmoidoscopy or colonoscopy). TREATMENT Most hemorrhoids can be treated at home. However, if symptoms do not seem to be getting better or if you have a lot of rectal bleeding, your caregiver may perform a procedure to help make the hemorrhoids get smaller or remove them completely. Possible treatments include:   Placing a rubber band at the base of the hemorrhoid to cut off the circulation (rubber band ligation).   Injecting a chemical to shrink the hemorrhoid (sclerotherapy).   Using a tool to burn the hemorrhoid (infrared light therapy).   Surgically removing the hemorrhoid (hemorrhoidectomy).   Stapling the hemorrhoid to block blood flow to the tissue (hemorrhoid stapling).  HOME CARE INSTRUCTIONS   Eat foods with fiber, such as whole grains, beans,  nuts, fruits, and vegetables. Ask your doctor about taking products with added fiber in them (fibersupplements).  Increase fluid intake. Drink enough water and fluids to keep your urine clear or pale yellow.   Exercise regularly.   Go to the bathroom when you have the urge to have a bowel movement. Do not wait.   Avoid straining to have bowel movements.   Keep the anal area dry and clean. Use wet toilet paper or moist towelettes after a bowel movement.   Medicated creams and suppositories may be used or applied as directed.   Only take over-the-counter or prescription medicines as directed by your caregiver.   Take warm sitz baths for 15 20 minutes, 3 4 times a day to ease pain and discomfort.   Place ice packs on the hemorrhoids if they are tender and swollen. Using ice packs between sitz baths may be helpful.   Put ice in a plastic bag.   Place a towel between your skin and the bag.   Leave the ice on for 15 20 minutes, 3 4 times a day.   Do not use a donut-shaped pillow or sit on the toilet for long periods. This increases blood pooling and pain.  SEEK MEDICAL CARE IF:  You have increasing pain and swelling that is not controlled by treatment or medicine.  You have uncontrolled bleeding.  You have difficulty or you are unable to have a bowel movement.  You have pain or inflammation outside the area of the hemorrhoids. MAKE SURE YOU:    Understand these instructions.  Will watch your condition.  Will get help right away if you are not doing well or get worse. Document Released: 06/22/2000 Document Revised: 06/11/2012 Document Reviewed: 04/29/2012 [facility] Patient Information 2014 [place].  Candidal Vulvovaginitis Candidal vulvovaginitis is an infection of the vagina and vulva. The vulva is the skin around the opening of the vagina. This may cause itching and discomfort in and around the vagina.  HOME CARE  Only take medicine as told by your  doctor.  Do not have sex (intercourse) until the infection is healed or as told by your doctor.  Practice safe sex.  Tell your sex partner about your infection.  Do not douche or use tampons.  Wear cotton underwear. Do not wear tight pants or panty hose.  Eat yogurt. This may help treat and prevent yeast infections. GET HELP RIGHT AWAY IF:   You have a fever.  Your problems get worse during treatment or do not get better in 3 days.  You have discomfort, irritation, or itching in your vagina or vulva area.  You have pain after sex.  You start to get belly (abdominal) pain. MAKE SURE YOU:  Understand these instructions.  Will watch your condition.  Will get help right away if you are not doing well or get worse. Document Released: 09/21/2008 Document Revised: 09/17/2011 Document Reviewed: 09/21/2008 [facility] Patient Information 2014 [place], [place].

## 2013-09-23 NOTE — Progress Notes (Signed)
   Subjective:    Patient ID: Pamela Martinez, female    DOB: 1971/06/07, 43 y.o.   MRN: [id number]  HPI Patient presents today with complaint or rectal pain and bleeding. Onset Sunday and gradually worsening. History of internal hemorrhoids.  Reports having vaginal itching, since Monday. Reports taking a dose of diflucan and denies improvement.    Review of Systems  Constitutional: Negative for fever and chills.  Respiratory: Negative for chest tightness and shortness of breath.   Cardiovascular: Negative for chest pain and palpitations.  Gastrointestinal: Positive for rectal pain. Negative for nausea, vomiting, abdominal pain, diarrhea, constipation and blood in stool.  Neurological: Negative for dizziness, weakness and headaches.       Objective:   Physical Exam  Constitutional: She is oriented to person, place, and time. She appears well-developed and well-nourished.  Cardiovascular: Normal rate, regular rhythm and normal heart sounds.   Pulmonary/Chest: Effort normal and breath sounds normal. No respiratory distress. She exhibits no tenderness.  Abdominal: Soft. Bowel sounds are normal. She exhibits no distension. There is no tenderness.  Genitourinary: Rectal exam shows internal hemorrhoid. Guaiac negative stool.  Neurological: She is alert and oriented to person, place, and time.  Skin: Skin is warm and dry.  Psychiatric: She has a normal mood and affect.     Results for orders placed in visit on 09/23/13  POCT CBC      Result Value Ref Range   WBC 6.1  4.6 - 10.2 K/uL   Lymph, poc 1.5  0.6 - 3.4   POC LYMPH PERCENT 23.9  10 - 50 %L   POC Granulocyte 4.2  2 - 6.9   Granulocyte percent 69.4  37 - 80 %G   RBC 4.1  4.04 - 5.48 M/uL   Hemoglobin 12.3  12.2 - 16.2 g/dL   HCT, POC 38.1  37.7 - 47.9 %   MCV 92.5  80 - 97 fL   MCH, POC 29.8  27 - 31.2 pg   MCHC 32.2  31.8 - 35.4 g/dL   RDW, POC 13.3     Platelet Count, POC 237.0  142 - 424 K/uL   MPV 7.9  0 - 99.8 fL    POCT WET PREP WITH KOH      Result Value Ref Range   Trichomonas, UA Negative     Clue Cells Wet Prep HPF POC neg     Epithelial Wet Prep HPF POC occ     Yeast Wet Prep HPF POC mod     Bacteria Wet Prep HPF POC occ     RBC Wet Prep HPF POC 1-3     WBC Wet Prep HPF POC 5-10     KOH Prep POC Positive          Assessment & Plan:  1. Rectal bleeding  - POCT CBC  2. Itching  - POCT Wet Prep with KOH  3. Vulvovaginal candidiasis  - clotrimazole (GYNE-LOTRIMIN) 1 % vaginal cream; Place 1 Applicatorful vaginally at bedtime.  Dispense: 45 g; Refill: 1  4. Internal hemorrhoid  - hydrocortisone (ANUSOL-HC) 25 MG suppository; Place 1 suppository (25 mg total) rectally 2 (two) times daily.  Dispense: 12 suppository; Refill: 0 -increase fluids -RTO if symptoms worsen or unresolved -Patient verbalized understanding Erby Pian, FNP-C

## 2013-09-24 ENCOUNTER — Encounter: Payer: Self-pay | Admitting: Nurse Practitioner

## 2013-09-24 ENCOUNTER — Ambulatory Visit (INDEPENDENT_AMBULATORY_CARE_PROVIDER_SITE_OTHER): Payer: BC Managed Care – PPO | Admitting: Nurse Practitioner

## 2013-09-24 VITALS — BP 126/81 | HR 97 | Temp 97.0°F | Ht 64.75 in | Wt 164.0 lb

## 2013-09-24 DIAGNOSIS — IMO0002 Reserved for concepts with insufficient information to code with codable children: Secondary | ICD-10-CM

## 2013-09-24 DIAGNOSIS — N898 Other specified noninflammatory disorders of vagina: Secondary | ICD-10-CM

## 2013-09-24 DIAGNOSIS — S2092XA Blister (nonthermal) of unspecified parts of thorax, initial encounter: Secondary | ICD-10-CM

## 2013-09-24 LAB — POCT WET PREP WITH KOH
CLUE CELLS WET PREP PER HPF POC: NEGATIVE
Trichomonas, UA: NEGATIVE
YEAST WET PREP PER HPF POC: NEGATIVE

## 2013-09-24 MED ORDER — VALACYCLOVIR HCL 1 G PO TABS: 1000.0000 mg | ORAL_TABLET | Freq: Two times a day (BID) | ORAL | Status: DC

## 2013-09-24 NOTE — Progress Notes (Signed)
   Subjective:    Patient ID: Pamela Martinez, female    DOB: 1971-01-05, 43 y.o.   MRN: [id number]  HPI  Patient comes in today c/o bumps in vaginal area- Saw [facility] yesterday and was diagnosed with internal hemorrhoids and yeast infection- Was given a cream for yeast and since she used cream she has developed pumps. Burn and hurt. Vaginal discharge today. Took a diflucan several years ago and had a reacion to it and wonders if that is what is going on.    Review of Systems  Constitutional: Negative.   HENT: Negative.   Respiratory: Negative.   Cardiovascular: Negative.   Genitourinary: Negative.   Musculoskeletal: Negative.   Psychiatric/Behavioral: Negative.   All other systems reviewed and are negative.       Objective:   Physical Exam  Constitutional: She is oriented to person, place, and time. She appears well-developed and well-nourished.  Cardiovascular: Normal rate, regular rhythm and normal heart sounds.   Pulmonary/Chest: Effort normal and breath sounds normal.  Genitourinary:  Tiny vesicular lesion covering perineal area- sore to touch Milky white vaginal discharge trickling from vaginal orifice  Neurological: She is alert and oriented to person, place, and time.   BP 126/81  Pulse 97  Temp(Src) 97 F (36.1 C) (Oral)  Ht 5' 4.75" (1.645 m)  Wt 164 lb (74.39 kg)  BMI 27.49 kg/m2        Assessment & Plan:   1. Vaginal discharge   2. Blister of perineum without infection    Meds ordered this encounter  Medications  . valACYclovir (VALTREX) 1000 MG tablet    Sig: Take 1 tablet (1,000 mg total) by mouth 2 (two) times daily.    Dispense:  20 tablet    Refill:  0    Order Specific Question:  Supervising Provider    Answer:  Chipper Herb [[id number]]   STD panel and G/C pending Will call with result Safe sex encouraged  Mary-Margaret Hassell Done, FNP

## 2013-09-24 NOTE — Patient Instructions (Addendum)
Herpes Simplex Herpes simplex is generally classified as Type 1 or Type 2. Type 1 is generally the type that is responsible for cold sores. Type 2 is generally associated with sexually transmitted diseases. We now know that most of the thoughts on these viruses are inaccurate. We find that HSV1 is also present genitally and HSV2 can be present orally, but this will vary in different locations of the world. Herpes simplex is usually detected by doing a culture. Blood tests are also available for this virus; however, the accuracy is often not as good.  PREPARATION FOR TEST No preparation or fasting is necessary. NORMAL FINDINGS  No virus present  No HSV antigens or antibodies present Ranges for normal findings may vary among different laboratories and hospitals. You should always check with your doctor after having lab work or other tests done to discuss the meaning of your test results and whether your values are considered within normal limits. MEANING OF TEST  Your caregiver will go over the test results with you and discuss the importance and meaning of your results, as well as treatment options and the need for additional tests if necessary. OBTAINING THE TEST RESULTS  It is your responsibility to obtain your test results. Ask the lab or department performing the test when and how you will get your results. Document Released: 07/28/2004 Document Revised: 09/17/2011 Document Reviewed: 06/05/2008 [employer] Patient Information 2014 [employer].  

## 2013-09-25 ENCOUNTER — Telehealth: Payer: Self-pay | Admitting: Nurse Practitioner

## 2013-09-25 LAB — STD SCREENING PANEL/HIGH RISK
HAV 1 IGG,TYPE SPECIFIC AB: <0.91 index (ref 0.00–0.90)
HIV 1/HIV 2 AB: NONREACTIVE
Hep A IgM: NEGATIVE
Hep B C IgM: NEGATIVE
Hep C Virus Ab: <0.1 s/co ratio (ref 0.0–0.9)
Hepatitis B Surface Ag: NEGATIVE
SYPHILIS RPR SCR: NONREACTIVE

## 2013-09-25 NOTE — Telephone Encounter (Signed)
Pharmacy changed in epic and patient aware lab work has not come back yet

## 2013-09-28 ENCOUNTER — Telehealth: Payer: Self-pay | Admitting: Nurse Practitioner

## 2013-09-28 NOTE — Telephone Encounter (Signed)
Please advise 

## 2013-09-28 NOTE — Telephone Encounter (Signed)
Haven't got all of them back yet

## 2013-09-28 NOTE — Telephone Encounter (Signed)
Discussed current results with patient

## 2013-09-28 NOTE — Telephone Encounter (Signed)
Patient is very upset and stressed will you go over what you have back please she is worry about this

## 2013-09-29 LAB — GC/CHLAMYDIA PROBE AMP
CHLAMYDIA, DNA PROBE: NEGATIVE
Neisseria gonorrhoeae by PCR: NEGATIVE

## 2013-10-01 ENCOUNTER — Ambulatory Visit (INDEPENDENT_AMBULATORY_CARE_PROVIDER_SITE_OTHER): Payer: BC Managed Care – PPO | Admitting: Family Medicine

## 2013-10-01 VITALS — BP 110/88 | HR 88 | Temp 98.0°F | Resp 18 | Ht 64.5 in | Wt 161.6 lb

## 2013-10-01 DIAGNOSIS — K5900 Constipation, unspecified: Secondary | ICD-10-CM

## 2013-10-01 DIAGNOSIS — N390 Urinary tract infection, site not specified: Secondary | ICD-10-CM

## 2013-10-01 DIAGNOSIS — R3911 Hesitancy of micturition: Secondary | ICD-10-CM

## 2013-10-01 DIAGNOSIS — N898 Other specified noninflammatory disorders of vagina: Secondary | ICD-10-CM

## 2013-10-01 DIAGNOSIS — R319 Hematuria, unspecified: Secondary | ICD-10-CM

## 2013-10-01 LAB — POCT URINALYSIS DIPSTICK
Glucose, UA: NEGATIVE
KETONES UA: NEGATIVE
Nitrite, UA: NEGATIVE
PH UA: 7
SPEC GRAV UA: 1.02
Urobilinogen, UA: 1

## 2013-10-01 LAB — POCT WET PREP WITH KOH
KOH PREP POC: NEGATIVE
TRICHOMONAS UA: NEGATIVE
Yeast Wet Prep HPF POC: NEGATIVE

## 2013-10-01 LAB — POCT UA - MICROSCOPIC ONLY
Casts, Ur, LPF, POC: NEGATIVE
Crystals, Ur, HPF, POC: NEGATIVE
MUCUS UA: NEGATIVE
YEAST UA: NEGATIVE

## 2013-10-01 MED ORDER — CIPROFLOXACIN HCL 500 MG PO TABS: 500.0000 mg | ORAL_TABLET | Freq: Two times a day (BID) | ORAL | Status: DC

## 2013-10-01 MED ORDER — FLUCONAZOLE 150 MG PO TABS: 150.0000 mg | ORAL_TABLET | Freq: Once | ORAL | Status: DC

## 2013-10-01 MED ORDER — PHENAZOPYRIDINE HCL 100 MG PO TABS: 100.0000 mg | ORAL_TABLET | Freq: Three times a day (TID) | ORAL | Status: DC | PRN

## 2013-10-01 NOTE — Patient Instructions (Addendum)
1.  Follow-up with primary care provider or here for repeat urine in two weeks.    Urinary Tract Infection Urinary tract infections (UTIs) can develop anywhere along your urinary tract. Your urinary tract is your body's drainage system for removing wastes and extra water. Your urinary tract includes two kidneys, two ureters, a bladder, and a urethra. Your kidneys are a pair of bean-shaped organs. Each kidney is about the size of your fist. They are located below your ribs, one on each side of your spine. CAUSES Infections are caused by microbes, which are microscopic organisms, including fungi, viruses, and bacteria. These organisms are so small that they can only be seen through a microscope. Bacteria are the microbes that most commonly cause UTIs. SYMPTOMS  Symptoms of UTIs may vary by age and gender of the patient and by the location of the infection. Symptoms in young women typically include a frequent and intense urge to urinate and a painful, burning feeling in the bladder or urethra during urination. Older women and men are more likely to be tired, shaky, and weak and have muscle aches and abdominal pain. A fever may mean the infection is in your kidneys. Other symptoms of a kidney infection include pain in your back or sides below the ribs, nausea, and vomiting. DIAGNOSIS To diagnose a UTI, your caregiver will ask you about your symptoms. Your caregiver also will ask to provide a urine sample. The urine sample will be tested for bacteria and white blood cells. White blood cells are made by your body to help fight infection. TREATMENT  Typically, UTIs can be treated with medication. Because most UTIs are caused by a bacterial infection, they usually can be treated with the use of antibiotics. The choice of antibiotic and length of treatment depend on your symptoms and the type of bacteria causing your infection. HOME CARE INSTRUCTIONS  If you were prescribed antibiotics, take them exactly as  your caregiver instructs you. Finish the medication even if you feel better after you have only taken some of the medication.  Drink enough water and fluids to keep your urine clear or pale yellow.  Avoid caffeine, tea, and carbonated beverages. They tend to irritate your bladder.  Empty your bladder often. Avoid holding urine for long periods of time.  Empty your bladder before and after sexual intercourse.  After a bowel movement, women should cleanse from front to back. Use each tissue only once. SEEK MEDICAL CARE IF:   You have back pain.  You develop a fever.  Your symptoms do not begin to resolve within 3 days. SEEK IMMEDIATE MEDICAL CARE IF:   You have severe back pain or lower abdominal pain.  You develop chills.  You have nausea or vomiting.  You have continued burning or discomfort with urination. MAKE SURE YOU:   Understand these instructions.  Will watch your condition.  Will get help right away if you are not doing well or get worse. Document Released: 04/04/2005 Document Revised: 12/25/2011 Document Reviewed: 08/03/2011 [facility] Patient Information 2014 [employer].

## 2013-10-01 NOTE — Progress Notes (Addendum)
Subjective:    Patient ID: Pamela Martinez, female    DOB: [birth year], 43 y.o.   MRN: [id number] This chart was scribed for Reginia Forts, MD by Vernell Barrier, Medical Scribe. This patient's care was started at 9:24 PM.  10/01/2013  Dysuria, Hematuria and Urinary Frequency  Dysuria  Associated symptoms include frequency and hematuria. Pertinent negatives include no chills, nausea or vomiting.  Hematuria Irritative symptoms include frequency. Associated symptoms include dysuria. Pertinent negatives include no abdominal pain, chills, fever, nausea or vomiting.  Urinary Frequency  Associated symptoms include frequency and hematuria. Pertinent negatives include no chills, nausea or vomiting.   HPI Comments: Pamela Martinez is a 43 y.o. female who presents to the [facility] complaining of dysuria, hematuria, and frequency, onset 2.5 hours ago. States 4.5 hours ago she felt the urge to pee but stream was small. 3.5 hours later, pt had to use the bathroom again and felt some mild burning along with bleeding. Had to go again 2.5 hours ago and this time dysuria was severe. Feels like she has had increasing trouble emptying her bladder over the last week. States stream used to be steady in flow, now only trickles out. Went to doctor last week and was treated for internal hemorrhoids and a yeast infection. Pt believed she had hemorrhoids 7 days ago due to increased urgency to have a BM but nothing would come out. States constipation went away but pain was still present along with blood on the tissue when she wiped. States she treated yeast infection and went to work the next day. 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 Pt was treated for STDs even though she was sure she did not have any. STD tests came back negative. States she did have sores around the vaginal area and was suspected of herpes but test came back negative.  Sores have since resolved. Pt has an IUD. Denies vaginal itching, vaginal discharge, vaginal burning, back pain, fever, chills, sweats, nausea ,vomiting, or abdominal pain.  Review of Systems  Constitutional: Negative for fever, chills and diaphoresis.  Gastrointestinal: Negative for nausea, vomiting and abdominal pain.  Genitourinary: Positive for dysuria, frequency and hematuria. Negative for vaginal discharge.  Musculoskeletal: Negative for back pain.   Past Medical History  Diagnosis Date  . Allergy   . 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 Cholestatin    Current Outpatient Prescriptions  Medication Sig Dispense Refill  . fluticasone (FLONASE) 50 MCG/ACT nasal spray Place 2 sprays into both nostrils daily.      Marland Kitchen levocetirizine (XYZAL) 5 MG tablet Take 1 tablet (5 mg total) by mouth every evening.  30 tablet  11  . valACYclovir (VALTREX) 1000 MG tablet Take 1 tablet (1,000 mg total) by mouth 2 (two) times daily.  20 tablet  0  . ciprofloxacin (CIPRO) 500 MG tablet Take 1 tablet (500 mg total) by mouth 2 (two) times daily.  20 tablet  0  . hydrocortisone (ANUSOL-HC) 25 MG suppository Place 1 suppository (25 mg total) rectally 2 (two) times daily.  12 suppository  0  . phenazopyridine (PYRIDIUM) 100 MG tablet Take 1-2 tablets (100-200 mg total) by mouth 3 (three) times daily as needed for pain.  12 tablet  0  . [DISCONTINUED] levonorgestrel (MIRENA) 20 MCG/24HR IUD 1 each by Intrauterine route once.  INSERTED 05-01-06.        Current Facility-Administered Medications  Medication Dose Route Frequency Provider Last Rate Last Dose  . levonorgestrel (MIRENA) 20 MCG/24HR IUD   Intrauterine Once Anastasio Auerbach, MD           Objective:    BP 110/88  Pulse 88  Temp(Src) 98 F (36.7 C) (Oral)  Resp 18  Ht 5' 4.5" (1.638 m)  Wt 161 lb 9.6 oz (73.301 kg)  BMI 27.32 kg/m2  SpO2 100% Physical Exam  Constitutional: She  is oriented to person, place, and time. She appears well-developed and well-nourished. No distress.  HENT:  Head: Normocephalic and atraumatic.  Eyes: EOM are normal.  Neck: Neck supple.  Cardiovascular: Normal rate, regular rhythm and normal heart sounds.   No murmur heard. Pulmonary/Chest: Effort normal and breath sounds normal. No respiratory distress. She has no wheezes. She has no rales.  Abdominal: Soft. Bowel sounds are normal. She exhibits no distension and no mass. There is no tenderness. There is no rebound and no guarding.  Genitourinary: Rectum normal and uterus normal. There is no rash, tenderness or lesion on the right labia. There is no rash, tenderness or lesion on the left labia. Cervix exhibits discharge. Cervix exhibits no motion tenderness. Right adnexum displays no mass, no tenderness and no fullness. Left adnexum displays no mass, no tenderness and no fullness. Vaginal discharge found.  No palpable bladder. Scant discharge from cervix that was clear with some blood. No CVA tenderness. No adnexal tenderness. Rectal normal.  Musculoskeletal: Normal range of motion.  Lymphadenopathy:    She has no cervical adenopathy.  Neurological: She is alert and oriented to person, place, and time.  Skin: Skin is warm and dry.  Psychiatric: She has a normal mood and affect. Her behavior is normal.  Vitals reviewed.  Results for orders placed in visit on 10/01/13  POCT URINALYSIS DIPSTICK      Result Value Ref Range   Color, UA red     Clarity, UA cloudy     Glucose, UA neg     Bilirubin, UA small     Ketones, UA neg     Spec Grav, UA 1.020     Blood, UA large     pH, UA 7.0     Protein, UA 3+     Urobilinogen, UA 1.0     Nitrite, UA neg     Leukocytes, UA large (3+)    POCT UA - MICROSCOPIC ONLY      Result Value Ref Range   WBC, Ur, HPF, POC 25-35     RBC, urine, microscopic TNTC     Bacteria, U Microscopic trace     Mucus, UA neg     Epithelial cells, urine per micros  1-3     Crystals, Ur, HPF, POC neg     Casts, Ur, LPF, POC neg     Yeast, UA neg    POCT WET PREP WITH KOH      Result Value Ref Range   Trichomonas, UA Negative     Clue Cells Wet Prep HPF POC 0-2     Epithelial Wet Prep HPF POC 1-3     Yeast Wet Prep HPF POC neg     Bacteria Wet Prep HPF POC 1+     RBC Wet Prep HPF POC 0-2     WBC Wet Prep HPF POC 10-15     KOH Prep POC Negative  Assessment & Plan:  Hematuria - Plan: POCT urinalysis dipstick, POCT UA - Microscopic Only, Urine culture, GC/Chlamydia Probe Amp  Vaginal discharge - Plan: POCT Wet Prep with KOH, GC/Chlamydia Probe Amp  UTI (urinary tract infection)  Urinary hesitancy  Unspecified constipation   1. Urinary hesitancy with hematuria: New. Send urine culture; treat with Cipro and Pyridium.  RTC fever, flank pain, vomiting. Recommend returning in two weeks to confirm that blood has resolved.  2.  Vaginal discharge: Persistent/new.  Obtain GC/Chlam.   No renal colic symptoms to suggest acute nephrolithiasis.  3.  Constipation:  New.  Recommend Miralax or Colace daily; increase fiber intake and water intake; recommend regular exercise.   Meds ordered this encounter  Medications  . ciprofloxacin (CIPRO) 500 MG tablet    Sig: Take 1 tablet (500 mg total) by mouth 2 (two) times daily.    Dispense:  20 tablet    Refill:  0  . phenazopyridine (PYRIDIUM) 100 MG tablet    Sig: Take 1-2 tablets (100-200 mg total) by mouth 3 (three) times daily as needed for pain.    Dispense:  12 tablet    Refill:  0    No Follow-up on file.  Reginia Forts, M.D.  [facility] [address] [place], [place]  [address] [phone] phone [phone] fax

## 2013-10-03 LAB — GC/CHLAMYDIA PROBE AMP
CT Probe RNA: NEGATIVE
GC PROBE AMP APTIMA: NEGATIVE

## 2013-10-03 LAB — URINE CULTURE
COLONY COUNT: NO GROWTH
Organism ID, Bacteria: NO GROWTH

## 2013-10-08 ENCOUNTER — Encounter: Payer: Self-pay | Admitting: Family Medicine

## 2013-10-13 ENCOUNTER — Telehealth: Payer: Self-pay | Admitting: *Deleted

## 2013-10-13 NOTE — Telephone Encounter (Signed)
Pt called triage requesting appointment with provider, pt said she had a lot of medical issues. I called patient back and left on her voicemail if problem to call appointment desk.

## 2013-10-28 ENCOUNTER — Ambulatory Visit (INDEPENDENT_AMBULATORY_CARE_PROVIDER_SITE_OTHER): Payer: BC Managed Care – PPO | Admitting: Women's Health

## 2013-10-28 ENCOUNTER — Encounter: Payer: Self-pay | Admitting: Women's Health

## 2013-10-28 DIAGNOSIS — N898 Other specified noninflammatory disorders of vagina: Secondary | ICD-10-CM

## 2013-10-28 DIAGNOSIS — N899 Noninflammatory disorder of vagina, unspecified: Secondary | ICD-10-CM

## 2013-10-28 DIAGNOSIS — R319 Hematuria, unspecified: Secondary | ICD-10-CM

## 2013-10-28 LAB — WET PREP FOR TRICH, YEAST, CLUE
CLUE CELLS WET PREP: NONE SEEN
TRICH WET PREP: NONE SEEN
WBC WET PREP: NONE SEEN
Yeast Wet Prep HPF POC: NONE SEEN

## 2013-10-28 LAB — URINALYSIS W MICROSCOPIC + REFLEX CULTURE
Bilirubin Urine: NEGATIVE
GLUCOSE, UA: NEGATIVE mg/dL
HGB URINE DIPSTICK: NEGATIVE
KETONES UR: NEGATIVE mg/dL
Leukocytes, UA: NEGATIVE
NITRITE: NEGATIVE
Protein, ur: NEGATIVE mg/dL
SPECIFIC GRAVITY, URINE: 1.02 (ref 1.005–1.030)
Urobilinogen, UA: 0.2 mg/dL (ref 0.0–1.0)
pH: 6.5 (ref 5.0–8.0)

## 2013-10-28 NOTE — Progress Notes (Signed)
Patient ID: AMANDEEP HOGSTON, female   DOB: Oct 17, 1970, 43 y.o.   MRN: [id number] Presents for follow up of hematuria. Large amount of blood in urine with pain, burning and increased frequency 10/01/2013, treated at urgent care for UTI with Cipro 500 mg BID x 10 days, urine culture was negative. Urinary symptoms and hematuria have now resolved. Also having recent vaginal discharge, STD screen negative at urgent care, treated for yeast infection with vaginal gel and Diflucan. Not sexually active, recently separated/husband not unfaithful. Monthly cycle.  Exam: Appears well, slightly worried over hematuria with negative culture. External genitalia within normal limits, speculum exam no visible blood,  scant clear discharge, no erythema or odor noted. Wet prep negative. Bimanual no CMT or adnexal fullness or tenderness. UA: WNL  Resolution of UTI  Plan: UTI prevention discussed. Reviewed urine culture negative most likely related to immediate culture testing with symptoms.  Instructed to call if symptoms recur . Encouraged  counseling.

## 2014-01-07 ENCOUNTER — Other Ambulatory Visit: Payer: Self-pay | Admitting: Family Medicine

## 2014-01-15 ENCOUNTER — Other Ambulatory Visit: Payer: Self-pay

## 2014-01-15 DIAGNOSIS — Z1231 Encounter for screening mammogram for malignant neoplasm of breast: Secondary | ICD-10-CM

## 2014-01-19 ENCOUNTER — Encounter: Payer: BC Managed Care – PPO | Admitting: Women's Health

## 2014-01-26 ENCOUNTER — Encounter: Payer: Self-pay | Admitting: Women's Health

## 2014-01-26 ENCOUNTER — Ambulatory Visit (INDEPENDENT_AMBULATORY_CARE_PROVIDER_SITE_OTHER): Payer: BC Managed Care – PPO | Admitting: Women's Health

## 2014-01-26 ENCOUNTER — Other Ambulatory Visit (HOSPITAL_COMMUNITY)
Admission: RE | Admit: 2014-01-26 | Discharge: 2014-01-26 | Disposition: A | Discharge status: Home / Self Care | Payer: BC Managed Care – PPO | Source: Ambulatory Visit | Attending: Women's Health | Admitting: Women's Health

## 2014-01-26 VITALS — BP 117/75 | Ht 65.0 in | Wt 169.2 lb

## 2014-01-26 DIAGNOSIS — F411 Generalized anxiety disorder: Secondary | ICD-10-CM

## 2014-01-26 DIAGNOSIS — R87810 Cervical high risk human papillomavirus (HPV) DNA test positive: Secondary | ICD-10-CM | POA: Insufficient documentation

## 2014-01-26 DIAGNOSIS — Z01419 Encounter for gynecological examination (general) (routine) without abnormal findings: Secondary | ICD-10-CM

## 2014-01-26 DIAGNOSIS — Z124 Encounter for screening for malignant neoplasm of cervix: Secondary | ICD-10-CM | POA: Insufficient documentation

## 2014-01-26 DIAGNOSIS — Z1151 Encounter for screening for human papillomavirus (HPV): Secondary | ICD-10-CM | POA: Insufficient documentation

## 2014-01-26 LAB — CBC WITH DIFFERENTIAL/PLATELET
BASOS ABS: 0.1 10*3/uL (ref 0.0–0.1)
BASOS PCT: 1 % (ref 0–1)
Eosinophils Absolute: 0.1 10*3/uL (ref 0.0–0.7)
Eosinophils Relative: 1 % (ref 0–5)
HCT: 40.9 % (ref 36.0–46.0)
HEMOGLOBIN: 14.1 g/dL (ref 12.0–15.0)
Lymphocytes Relative: 38 % (ref 12–46)
Lymphs Abs: 2.2 10*3/uL (ref 0.7–4.0)
MCH: 30.3 pg (ref 26.0–34.0)
MCHC: 34.5 g/dL (ref 30.0–36.0)
MCV: 88 fL (ref 78.0–100.0)
Monocytes Absolute: 0.3 10*3/uL (ref 0.1–1.0)
Monocytes Relative: 6 % (ref 3–12)
NEUTROS ABS: 3.1 10*3/uL (ref 1.7–7.7)
Neutrophils Relative %: 54 % (ref 43–77)
Platelets: 247 10*3/uL (ref 150–400)
RBC: 4.65 MIL/uL (ref 3.87–5.11)
RDW: 14.1 % (ref 11.5–15.5)
WBC: 5.8 10*3/uL (ref 4.0–10.5)

## 2014-01-26 LAB — GLUCOSE, RANDOM: Glucose, Bld: 78 mg/dL (ref 70–99)

## 2014-01-26 MED ORDER — ALPRAZOLAM 0.25 MG PO TABS: 0.2500 mg | ORAL_TABLET | Freq: Every evening | ORAL | Status: DC | PRN

## 2014-01-26 NOTE — Progress Notes (Signed)
Pamela Martinez 45-Jan-1972 [id number]    History:    Presents for annual exam.  Rare bleeding with Mirena IUD placed 05/2011. 2014 Mammogram normal after diagnostic mammogram, breast tissue dense. 2013 positive HR HPV with negative biopsy. Separated from husband, new partner with negative cultures. 2014 lipid screen excellent.  Past medical history, past surgical history, family history and social history were all reviewed and documented in the EPIC chart. 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 Truman Hayward 8, Clarise Cruz 7 both doing well.  ROS:  A  12 point ROS was performed and pertinent positives and negatives are included.  Exam:  Filed Vitals:   01/26/14 1109  BP: 117/75    General appearance:  Normal Thyroid:  Symmetrical, normal in size, without palpable masses or nodularity. Respiratory  Auscultation:  Clear without wheezing or rhonchi Cardiovascular  Auscultation:  Regular rate, without rubs, murmurs or gallops  Edema/varicosities:  Not grossly evident Abdominal  Soft,nontender, without masses, guarding or rebound.  Liver/spleen:  No organomegaly noted  Hernia:  None appreciated  Skin  Inspection:  Grossly normal   Breasts: Examined lying and sitting.     Right: Without masses, retractions, discharge or axillary adenopathy.     Left: Without masses, retractions, discharge or axillary adenopathy. Gentitourinary   Inguinal/mons:  Normal without inguinal adenopathy  External genitalia:  Normal  BUS/Urethra/Skene's glands:  Normal  Vagina:  Normal  Cervix:  Normal IUD strings  Uterus:   normal in size, shape and contour.  Midline and mobile  Adnexa/parametria:     Rt: Without masses or tenderness.   Lt: Without masses or tenderness.  Anus and perineum: Normal  Digital rectal exam: Normal sphincter tone without palpated masses or tenderness  Assessment/Plan:  43 y.o. SeparatedWF G2P2 for annual exam with no complaints.  Rare bleeding Mirena IUD 05/2011 Positive HR HPV 06/2011/negative  biopsy 07/2011  Plan: SBE's, continue annual mammogram 3D tomography reviewed and encouraged history of dense breast. Increase regular exercise, decrease calories for weight loss/ maintenance. Counseling as needed for impending divorce. CBC, glucose, UA, Pap with HR HPV typing, new screening guidelines reviewed.  Note: This dictation was prepared with Dragon/digital dictation.  Any transcriptional errors that result are unintentional. Huel Cote [facility], 1:06 PM 01/26/2014

## 2014-01-26 NOTE — Patient Instructions (Signed)

## 2014-01-27 LAB — URINALYSIS W MICROSCOPIC + REFLEX CULTURE
Bacteria, UA: NONE SEEN
Bilirubin Urine: NEGATIVE
Casts: NONE SEEN
Crystals: NONE SEEN
Glucose, UA: NEGATIVE mg/dL
HGB URINE DIPSTICK: NEGATIVE
Ketones, ur: NEGATIVE mg/dL
LEUKOCYTES UA: NEGATIVE
NITRITE: NEGATIVE
PROTEIN: NEGATIVE mg/dL
Specific Gravity, Urine: 1.01 (ref 1.005–1.030)
Squamous Epithelial / LPF: NONE SEEN
UROBILINOGEN UA: 0.2 mg/dL (ref 0.0–1.0)
pH: 5.5 (ref 5.0–8.0)

## 2014-01-27 LAB — CYTOLOGY - PAP

## 2014-02-03 ENCOUNTER — Ambulatory Visit
Admission: RE | Admit: 2014-02-03 | Discharge: 2014-02-03 | Disposition: A | Discharge status: Home / Self Care | Payer: BC Managed Care – PPO | Source: Ambulatory Visit

## 2014-02-03 DIAGNOSIS — Z1231 Encounter for screening mammogram for malignant neoplasm of breast: Secondary | ICD-10-CM

## 2014-02-04 ENCOUNTER — Other Ambulatory Visit: Payer: Self-pay | Admitting: Women's Health

## 2014-02-04 DIAGNOSIS — R928 Other abnormal and inconclusive findings on diagnostic imaging of breast: Secondary | ICD-10-CM

## 2014-02-15 ENCOUNTER — Ambulatory Visit
Admission: RE | Admit: 2014-02-15 | Discharge: 2014-02-15 | Disposition: A | Discharge status: Home / Self Care | Payer: BC Managed Care – PPO | Source: Ambulatory Visit | Attending: Women's Health | Admitting: Women's Health

## 2014-02-15 DIAGNOSIS — R928 Other abnormal and inconclusive findings on diagnostic imaging of breast: Secondary | ICD-10-CM

## 2014-03-02 ENCOUNTER — Encounter: Payer: Self-pay | Admitting: Gynecology

## 2014-03-02 ENCOUNTER — Ambulatory Visit (INDEPENDENT_AMBULATORY_CARE_PROVIDER_SITE_OTHER): Payer: BC Managed Care – PPO | Admitting: Gynecology

## 2014-03-02 DIAGNOSIS — IMO0002 Reserved for concepts with insufficient information to code with codable children: Secondary | ICD-10-CM

## 2014-03-02 DIAGNOSIS — R6889 Other general symptoms and signs: Secondary | ICD-10-CM

## 2014-03-02 NOTE — Progress Notes (Signed)
Patient ID: Pamela Martinez, female   DOB: [birth year], 43 y.o.   MRN: [id number] [name] [birth year] [id number]        43 y.o.  G2P2 presents for colposcopy with history of recent Pap smear showing ASCUS with positive high-risk HPV. The patient had similar episode 07/2011 with a normal colposcopy and negative ECC. Her intervening Pap smears without HPV screening were normal 2013 and 2014.  Past medical history,surgical history, problem list, medications, allergies, family history and social history were all reviewed and documented in the EPIC chart.  Directed ROS with pertinent positives and negatives documented in the history of present illness/assessment and plan.  Exam: Kim assistant General appearance:  Normal External BUS vagina normal. Cervix grossly normal. IUD string visualized. Uterus normal size midline mobile nontender. Adnexa without masses or tenderness.  Colposcopy after acetic acid cleanse adequate showing faint acetowhite change at 10 to 11:00 transformation zone. Otherwise normal. Biopsy at 11:00 taken. ECC performed. Physical Exam  Genitourinary:       Assessment/Plan:  43 y.o. 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 Colposcopy with faint acetowhite change. Biopsy and ECC taken. Patient will followup for the results. I again had a long discussion about dysplasia, high grade/low grade, progression/regression and the HPV association. If normal or low grade then plan followup Pap smear in one year. If otherwise then we'll triage results.   Note: This document was prepared with digital dictation and possible smart phrase technology. Any transcriptional errors that result from this process are unintentional.   Pamela Auerbach MD, 12:05 PM 03/02/2014

## 2014-03-02 NOTE — Patient Instructions (Signed)
Office will call you with the biopsy results 

## 2014-03-03 ENCOUNTER — Encounter: Payer: Self-pay | Admitting: Gynecology

## 2014-04-14 ENCOUNTER — Emergency Department (HOSPITAL_COMMUNITY): Payer: BC Managed Care – PPO

## 2014-04-14 ENCOUNTER — Encounter (HOSPITAL_COMMUNITY): Payer: Self-pay | Admitting: Emergency Medicine

## 2014-04-14 ENCOUNTER — Emergency Department (HOSPITAL_COMMUNITY)
Admission: EM | Admit: 2014-04-14 | Discharge: 2014-04-14 | Disposition: A | Discharge status: Home / Self Care | Payer: BC Managed Care – PPO | Attending: Emergency Medicine | Admitting: Emergency Medicine

## 2014-04-14 DIAGNOSIS — Z79899 Other long term (current) drug therapy: Secondary | ICD-10-CM | POA: Insufficient documentation

## 2014-04-14 DIAGNOSIS — Z87891 Personal history of nicotine dependence: Secondary | ICD-10-CM | POA: Diagnosis not present

## 2014-04-14 DIAGNOSIS — Z7952 Long term (current) use of systemic steroids: Secondary | ICD-10-CM | POA: Insufficient documentation

## 2014-04-14 DIAGNOSIS — R079 Chest pain, unspecified: Secondary | ICD-10-CM | POA: Insufficient documentation

## 2014-04-14 DIAGNOSIS — R0602 Shortness of breath: Secondary | ICD-10-CM | POA: Insufficient documentation

## 2014-04-14 DIAGNOSIS — Z7951 Long term (current) use of inhaled steroids: Secondary | ICD-10-CM | POA: Insufficient documentation

## 2014-04-14 LAB — CBC WITH DIFFERENTIAL/PLATELET
Basophils Absolute: 0.1 10*3/uL (ref 0.0–0.1)
Basophils Relative: 1 % (ref 0–1)
EOS ABS: 0.1 10*3/uL (ref 0.0–0.7)
EOS PCT: 1 % (ref 0–5)
HCT: 41.8 % (ref 36.0–46.0)
Hemoglobin: 14.4 g/dL (ref 12.0–15.0)
LYMPHS ABS: 2 10*3/uL (ref 0.7–4.0)
Lymphocytes Relative: 26 % (ref 12–46)
MCH: 32 pg (ref 26.0–34.0)
MCHC: 34.4 g/dL (ref 30.0–36.0)
MCV: 92.9 fL (ref 78.0–100.0)
MONO ABS: 0.7 10*3/uL (ref 0.1–1.0)
MONOS PCT: 9 % (ref 3–12)
Neutro Abs: 4.9 10*3/uL (ref 1.7–7.7)
Neutrophils Relative %: 63 % (ref 43–77)
Platelets: 227 10*3/uL (ref 150–400)
RBC: 4.5 MIL/uL (ref 3.87–5.11)
RDW: 13 % (ref 11.5–15.5)
WBC: 7.8 10*3/uL (ref 4.0–10.5)

## 2014-04-14 LAB — BASIC METABOLIC PANEL
Anion gap: 11 (ref 5–15)
BUN: 17 mg/dL (ref 6–23)
CALCIUM: 9 mg/dL (ref 8.4–10.5)
CO2: 22 mEq/L (ref 19–32)
CREATININE: 0.84 mg/dL (ref 0.50–1.10)
Chloride: 106 mEq/L (ref 96–112)
GFR calc Af Amer: >90 mL/min (ref >90)
GFR, EST NON AFRICAN AMERICAN: 83 mL/min — AB (ref >90)
GLUCOSE: 92 mg/dL (ref 70–99)
Potassium: 3.8 mEq/L (ref 3.7–5.3)
Sodium: 139 mEq/L (ref 137–147)

## 2014-04-14 LAB — TROPONIN I: Troponin I: <0.3 ng/mL (ref <0.30)

## 2014-04-14 MED ORDER — PREDNISONE 50 MG PO TABS: ORAL_TABLET | ORAL | Status: DC

## 2014-04-14 MED ORDER — AZITHROMYCIN 250 MG PO TABS: ORAL_TABLET | ORAL | Status: DC

## 2014-04-14 NOTE — ED Notes (Signed)
C/o chest pain that began today prior to arrival. C/o squeezing in chest that stopped in center of chest associated with sob as well. Denies any pain at this time. Took 1 81mg  ASA at 1134

## 2014-04-14 NOTE — ED Notes (Signed)
Dr Lacinda Axon In room to see pt

## 2014-04-14 NOTE — ED Provider Notes (Signed)
CSN: [id number]     Arrival date & time 04/14/14  1207 History  This chart was scribed for Pamela Christen, MD by Molli Posey, ED Scribe. This patient was seen in room APA11/APA11 and the patient's care was started 12:46 PM.    Chief Complaint  Patient presents with  . Chest Pain     The history is provided by the patient. No language interpreter was used.   HPI Comments: Pamela Martinez is a 43 y.o. female who presents to the Emergency Department complaining of an episode of chest pain that started at 11am this morning and lasted for 10 minutes. She describes the pain as a generalized tightnening around her chest, and a severe, sharp central pain as well. She states she had associated shortness of breath during the episode. Per nursing, patient took 1 81mg  ASA at 11:34AM. She states she feels normal currently and states she is symptom free. She denies nausea and diaphoresis as symptoms. No cardiac risk factors.  Patient is completely symptom free at this time.   Past Medical History  Diagnosis Date  . Allergy   . 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 Knee surgery      X 4 = 1X RIGHT 3X LEFT/3 ARTHROSCOPIES AND 1 ACL  . Knee surgery    . Mirena  02/03/2011   Family History  Problem Relation Age of Onset  . Cancer Father     THROAT  . Hyperlipidemia Father   . Cancer Paternal Aunt     PRIMARY PERITONEAL CANCER  . Hypertension Paternal Grandfather   . Cancer Paternal Grandfather     COLON  . Hyperlipidemia Brother   . Hypertension Brother    History  Substance Use Topics  . Smoking status: Former Smoker    Types: Cigarettes    Quit date: 07/09/1988  . Smokeless tobacco: Never Used  . Alcohol Use: Yes   OB History   Grav Para Term Preterm Abortions TAB SAB Ect Mult Living   2 2        2      Review of Systems   Constitutional: Negative for diaphoresis.  Respiratory: Positive for chest tightness and shortness of breath.   Cardiovascular: Positive for chest pain.  Gastrointestinal: Negative for nausea.  All other systems reviewed and are negative.    Allergies  Diflucan  Home Medications   Prior to Admission medications   Medication Sig Start Date End Date Taking? Authorizing Provider  ALPRAZolam (XANAX) 0.25 MG tablet Take 1 tablet (0.25 mg total) by mouth at bedtime as needed for anxiety. 01/26/14   Huel Cote, NP  azithromycin (ZITHROMAX Z-PAK) 250 MG tablet 2 po day one, then 1 daily x 4 days 04/14/14   Pamela Christen, MD  fluticasone [facility]) 50 MCG/ACT nasal spray Place 2 sprays into both nostrils daily.    Historical Provider, MD  hydrocortisone (ANUSOL-HC) 25 MG suppository Place 1 suppository (25 mg total) rectally 2 (two) times daily. 09/23/13   Erby Pian, FNP  levocetirizine (XYZAL) 5 MG tablet Take 1 tablet (5 mg total) by mouth every evening. 12/17/12   Susy Frizzle, MD  predniSONE (DELTASONE) 50 MG tablet One daily for 7 days 04/14/14   Pamela Christen, MD  Topiramate (TOPAMAX PO) Take 2 tablets by mouth 2 (two) times  daily.     Historical Provider, MD   BP 105/79  Pulse 72  Temp(Src) 98 F (36.7 C) (Oral)  Resp 17  SpO2 100%  LMP 04/04/2014 Physical Exam  Nursing note and vitals reviewed. Constitutional: She is oriented to person, place, and time. She appears well-developed and well-nourished.  HENT:  Head: Normocephalic and atraumatic.  Eyes: Conjunctivae and EOM are normal. Pupils are equal, round, and reactive to light.  Neck: Normal range of motion. Neck supple.  Cardiovascular: Normal rate, regular rhythm and normal heart sounds.   Pulmonary/Chest: Effort normal and breath sounds normal.  Abdominal: Soft. Bowel sounds are normal.  Musculoskeletal: Normal range of motion.  Neurological: She is alert and oriented to person, place, and time.  Skin: Skin is warm  and dry.  Psychiatric: She has a normal mood and affect. Her behavior is normal.    ED Course  Procedures  DIAGNOSTIC STUDIES: Oxygen Saturation is 100% on RA, normal by my interpretation.    COORDINATION OF CARE: 12:53 PM Discussed treatment plan with pt at bedside and pt agreed to plan. Discussed risk factor profile.    Labs Review Labs Reviewed  BASIC METABOLIC PANEL - Abnormal; Notable for the following:    GFR calc non Af Amer 83 (*)    All other components within normal limits  CBC WITH DIFFERENTIAL  TROPONIN I    Imaging Review No results found.   EKG Interpretation   Date/Time:  Wednesday April 14 2014 12:24:04 EDT Ventricular Rate:  57 PR Interval:  146 QRS Duration: 94 QT Interval:  443 QTC Calculation: 431 R Axis:   66 Text Interpretation:  Sinus rhythm Confirmed by Jamiah Homeyer  MD, Tameika Heckmann ([id number]) on  04/14/2014 12:26:57 PM      MDM   Final diagnoses:  Chest pain   Patient is extremely low risk for acute coronary syndrome or pulmonary embolism. Screening tests including labs, troponin, EKG, chest x-ray and all negative. Patient has no upper respiratory symptoms.   I personally performed the services described in this documentation, which was scribed in my presence. The recorded information has been reviewed and is accurate.      Pamela Christen, MD 04/17/14 [phone]

## 2014-04-14 NOTE — Discharge Instructions (Signed)
Chest xray showed some mild lung inflammation.  Rx for antibiotic and prednisone.  Follow up your primary care dr.  If symptoms persist, may need to see a cardiologist

## 2014-04-23 ENCOUNTER — Other Ambulatory Visit: Payer: Self-pay

## 2014-05-10 ENCOUNTER — Encounter (HOSPITAL_COMMUNITY): Payer: Self-pay | Admitting: Emergency Medicine

## 2015-01-25 ENCOUNTER — Other Ambulatory Visit: Payer: Self-pay | Admitting: Women's Health

## 2015-01-25 NOTE — Telephone Encounter (Signed)
Rx called in .  Sent note to Linus Salmons to try and get her scheduled for CE.

## 2015-01-25 NOTE — Telephone Encounter (Signed)
Annual exam is due after 01/27/15.  I will ask appt desk to call and schedule her.

## 2015-02-15 ENCOUNTER — Other Ambulatory Visit: Payer: Self-pay

## 2015-02-15 DIAGNOSIS — Z1231 Encounter for screening mammogram for malignant neoplasm of breast: Secondary | ICD-10-CM

## 2015-03-25 ENCOUNTER — Ambulatory Visit
Admission: RE | Admit: 2015-03-25 | Discharge: 2015-03-25 | Disposition: A | Discharge status: Home / Self Care | Payer: BC Managed Care – PPO | Source: Ambulatory Visit

## 2015-03-25 DIAGNOSIS — Z1231 Encounter for screening mammogram for malignant neoplasm of breast: Secondary | ICD-10-CM

## 2015-03-29 ENCOUNTER — Other Ambulatory Visit: Payer: Self-pay | Admitting: Women's Health

## 2015-03-29 DIAGNOSIS — R928 Other abnormal and inconclusive findings on diagnostic imaging of breast: Secondary | ICD-10-CM

## 2015-04-07 ENCOUNTER — Ambulatory Visit
Admission: RE | Admit: 2015-04-07 | Discharge: 2015-04-07 | Disposition: A | Discharge status: Home / Self Care | Payer: BC Managed Care – PPO | Source: Ambulatory Visit | Attending: Women's Health | Admitting: Women's Health

## 2015-04-07 ENCOUNTER — Encounter: Payer: Self-pay | Admitting: Women's Health

## 2015-04-07 DIAGNOSIS — R928 Other abnormal and inconclusive findings on diagnostic imaging of breast: Secondary | ICD-10-CM

## 2015-08-12 ENCOUNTER — Other Ambulatory Visit (HOSPITAL_COMMUNITY)
Admission: RE | Admit: 2015-08-12 | Discharge: 2015-08-12 | Disposition: A | Discharge status: Home / Self Care | Payer: BC Managed Care – PPO | Source: Ambulatory Visit | Attending: Women's Health | Admitting: Women's Health

## 2015-08-12 ENCOUNTER — Ambulatory Visit (INDEPENDENT_AMBULATORY_CARE_PROVIDER_SITE_OTHER): Payer: BC Managed Care – PPO | Admitting: Women's Health

## 2015-08-12 ENCOUNTER — Encounter: Payer: Self-pay | Admitting: Women's Health

## 2015-08-12 VITALS — BP 124/80 | Ht 65.0 in | Wt 173.0 lb

## 2015-08-12 DIAGNOSIS — Z01419 Encounter for gynecological examination (general) (routine) without abnormal findings: Secondary | ICD-10-CM

## 2015-08-12 DIAGNOSIS — Z1151 Encounter for screening for human papillomavirus (HPV): Secondary | ICD-10-CM | POA: Diagnosis present

## 2015-08-12 NOTE — Progress Notes (Signed)
TIERSA MILANI [birth year] [id number]    History:    Presents for annual exam.  02/2011 rare bleeding Mirena IUD. 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 New partner with negative STD screen. Normal mammogram history after diagnostic 9/ 2016.  Past medical history, past surgical history, family history and social history were all reviewed and documented in the EPIC chart. Works in the court system. Son 62, daughter 8 both doing well.  ROS:  A ROS was performed and pertinent positives and negatives are included.  Exam:  Filed Vitals:   08/12/15 1440  BP: 124/80    General appearance:  Normal Thyroid:  Symmetrical, normal in size, without palpable masses or nodularity. Respiratory  Auscultation:  Clear without wheezing or rhonchi Cardiovascular  Auscultation:  Regular rate, without rubs, murmurs or gallops  Edema/varicosities:  Not grossly evident Abdominal  Soft,nontender, without masses, guarding or rebound.  Liver/spleen:  No organomegaly noted  Hernia:  None appreciated  Skin  Inspection:  Grossly normal   Breasts: Examined lying and sitting.     Right: Without masses, retractions, discharge or axillary adenopathy.     Left: Without masses, retractions, discharge or axillary adenopathy. Gentitourinary   Inguinal/mons:  Normal without inguinal adenopathy  External genitalia:  Normal  BUS/Urethra/Skene's glands:  Normal  Vagina:  Normal  Cervix:  Normal IUD  strings visible  Uterus:   normal in size, shape and contour.  Midline and mobile  Adnexa/parametria:     Rt: Without masses or tenderness.   Lt: Without masses or tenderness.  Anus and perineum: Normal  Digital rectal exam: Normal sphincter tone without palpated masses or tenderness  Assessment/Plan:  45 y.o. DWF G2 P2  for annual exam with no complaints.  02/2011 Mirena IUD rare bleeding Positive HR HPV with negative biopsy Labs-primary care-normal  Plan: SBE's, continue annual  3-D screening mammogram history of dense breasts. Instructed to schedule Mirena IUD to be removed and replaced July or August 2017 with Dr. Phineas Real. Reviewed slight risk of infection, perforation or hemorrhage. Reviewed importance of regular exercise, decrease calories/carbs, vitamin D 1000 daily encouraged. UA, Pap with HR HPV typing.Marland Kitchen    Huel Cote WHNP, 4:02 PM 08/12/2015

## 2015-08-13 LAB — URINALYSIS W MICROSCOPIC + REFLEX CULTURE
BILIRUBIN URINE: NEGATIVE
Bacteria, UA: NONE SEEN [HPF]
Casts: NONE SEEN [LPF]
Crystals: NONE SEEN [HPF]
GLUCOSE, UA: NEGATIVE
Hgb urine dipstick: NEGATIVE
Ketones, ur: NEGATIVE
LEUKOCYTES UA: NEGATIVE
NITRITE: NEGATIVE
PH: 6 (ref 5.0–8.0)
PROTEIN: NEGATIVE
RBC / HPF: NONE SEEN RBC/HPF (ref <=2)
Specific Gravity, Urine: 1.01 (ref 1.001–1.035)
Squamous Epithelial / LPF: NONE SEEN [HPF] (ref <=5)
WBC UA: NONE SEEN WBC/HPF (ref <=5)
Yeast: NONE SEEN [HPF]

## 2015-08-16 LAB — CYTOLOGY - PAP

## 2015-08-17 ENCOUNTER — Telehealth: Payer: Self-pay | Admitting: Gynecology

## 2015-08-17 NOTE — Telephone Encounter (Signed)
02/08/178-Pt was advised today that her [facility] ins will cover the Mirena IUD for contraception at 100%, no copay, including the removal of existing IUD. She is due in July or August for this and her plan runs calendar year so these benefits will still be the same then. Per [id number]

## 2016-02-10 ENCOUNTER — Encounter: Payer: Self-pay | Admitting: Gynecology

## 2016-02-10 ENCOUNTER — Ambulatory Visit (INDEPENDENT_AMBULATORY_CARE_PROVIDER_SITE_OTHER): Payer: BC Managed Care – PPO | Admitting: Gynecology

## 2016-02-10 VITALS — BP 120/76

## 2016-02-10 DIAGNOSIS — Z30433 Encounter for removal and reinsertion of intrauterine contraceptive device: Secondary | ICD-10-CM | POA: Diagnosis not present

## 2016-02-10 HISTORY — PX: INTRAUTERINE DEVICE INSERTION: SHX323

## 2016-02-10 NOTE — Patient Instructions (Signed)
Intrauterine Device Insertion Most often, an intrauterine device (IUD) is inserted into the uterus to prevent pregnancy. There are 2 types of IUDs available:  Copper IUD--This type of IUD creates an environment that is not favorable to sperm survival. The mechanism of action of the copper IUD is not known for certain. It can stay in place for 10 years.  Hormone IUD--This type of IUD contains the hormone progestin (synthetic progesterone). The progestin thickens the cervical mucus and prevents sperm from entering the uterus, and it also thins the uterine lining. There is no evidence that the hormone IUD prevents implantation. One hormone IUD can stay in place for up to 5 years, and a different hormone IUD can stay in place for up to 3 years. An IUD is the most cost-effective birth control if left in place for the full duration. It may be removed at any time. LET YOUR HEALTH CARE PROVIDER KNOW ABOUT:  Any allergies you have.  All medicines you are taking, including vitamins, herbs, eye drops, creams, and over-the-counter medicines.  Previous problems you or members of your family have had with the use of anesthetics.  Any blood disorders you have.  Previous surgeries you have had.  Possibility of pregnancy.  Medical conditions you have. RISKS AND COMPLICATIONS  Generally, intrauterine device insertion is a safe procedure. However, as with any procedure, complications can occur. Possible complications include:  Accidental puncture (perforation) of the uterus.  Accidental placement of the IUD either in the muscle layer of the uterus (myometrium) or outside the uterus. If this happens, the IUD can be found essentially floating around the bowels and must be taken out surgically.  The IUD may fall out of the uterus (expulsion). This is more common in women who have recently had a child.   Pregnancy in the fallopian tube (ectopic).  Pelvic inflammatory disease (PID), which is infection of  the uterus and fallopian tubes. The risk of PID is slightly increased in the first 20 days after the IUD is placed, but the overall risk is still very low. BEFORE THE PROCEDURE  Schedule the IUD insertion for when you will have your menstrual period or right after, to make sure you are not pregnant. Placement of the IUD is better tolerated shortly after a menstrual cycle.  You may need to take tests or be examined to make sure you are not pregnant.  You may be required to take a pregnancy test.  You may be required to get checked for sexually transmitted infections (STIs) prior to placement. Placing an IUD in someone who has an infection can make the infection worse.  You may be given a pain reliever to take 1 or 2 hours before the procedure.  An exam will be performed to determine the size and position of your uterus.  Ask your health care provider about changing or stopping your regular medicines. PROCEDURE   A tool (speculum) is placed in the vagina. This allows your health care provider to see the lower part of the uterus (cervix).  The cervix is prepped with a medicine that lowers the risk of infection.  You may be given a medicine to numb each side of the cervix (intracervical or paracervical block). This is used to block and control any discomfort with insertion.  A tool (uterine sound) is inserted into the uterus to determine the length of the uterine cavity and the direction the uterus may be tilted.  A slim instrument (IUD inserter) is inserted through the cervical   canal and into your uterus.  The IUD is placed in the uterine cavity and the insertion device is removed.  The nylon string that is attached to the IUD and used for eventual IUD removal is trimmed. It is trimmed so that it lays high in the vagina, just outside the cervix. AFTER THE PROCEDURE  You may have bleeding after the procedure. This is normal. It varies from light spotting for a few days to menstrual-like  bleeding.  You may have mild cramping.   This information is not intended to replace advice given to you by your health care provider. Make sure you discuss any questions you have with your health care provider.   Document Released: 02/21/2011 Document Revised: 04/15/2013 Document Reviewed: 12/14/2012 Elsevier Interactive Patient Education 2016 [employer].  

## 2016-02-10 NOTE — Progress Notes (Signed)
    Pamela Martinez [birth year] [id number]        45 y.o.  G2P2  presents for Mirena IUD removal and replacement. She has read through the booklet, has no contraindications and signed the consent form. She currently is on a normal menses.  I reviewed the removal and insertional process with her as well as the risks to include infection, either immediate or long-term, uterine perforation or migration requiring surgery to remove, other complications such as pain and possibility of failure with subsequent pregnancy.   Exam with Caryn Bee assistant Vitals:   02/10/16 0838  BP: 120/76    Pelvic: External BUS vagina normal. Cervix normal With IUD string visualized. Light menses flow. Uterus anteverted normal size shape contour midline mobile nontender. Adnexa without masses or tenderness.  Procedure: The cervix was visualized and the old IUD string was grasped with the [facility] forcep, the IUD removed, shown to the patient and discarded. The cervix was then cleansed with Betadine, anterior lip grasped with a single-tooth tenaculum, the uterus was sounded and a Mirena IUD was placed according to manufacturer's recommendations without difficulty. The strings were trimmed. The patient tolerated well and will follow up in one month for a postinsertional check.  Lot number:  [id number]    Anastasio Auerbach MD, 8:54 AM 02/10/2016

## 2016-02-17 ENCOUNTER — Other Ambulatory Visit: Payer: Self-pay | Admitting: Women's Health

## 2016-02-20 NOTE — Telephone Encounter (Signed)
Called into pharmacy

## 2016-02-20 NOTE — Telephone Encounter (Signed)
[facility] for refill,

## 2016-02-23 DIAGNOSIS — Z975 Presence of (intrauterine) contraceptive device: Secondary | ICD-10-CM | POA: Insufficient documentation

## 2016-03-13 ENCOUNTER — Ambulatory Visit: Payer: BC Managed Care – PPO | Admitting: Gynecology

## 2016-03-14 ENCOUNTER — Ambulatory Visit (INDEPENDENT_AMBULATORY_CARE_PROVIDER_SITE_OTHER): Payer: BC Managed Care – PPO | Admitting: Gynecology

## 2016-03-14 VITALS — BP 118/76

## 2016-03-14 DIAGNOSIS — Z30431 Encounter for routine checking of intrauterine contraceptive device: Secondary | ICD-10-CM

## 2016-03-14 DIAGNOSIS — N938 Other specified abnormal uterine and vaginal bleeding: Secondary | ICD-10-CM | POA: Diagnosis not present

## 2016-03-14 MED ORDER — MEGESTROL ACETATE 20 MG PO TABS
20.0000 mg | ORAL_TABLET | Freq: Every day | ORAL | 0 refills | Status: DC
Start: 2016-03-14 — End: 2016-08-24

## 2016-03-14 NOTE — Patient Instructions (Signed)
Take the Megace progesterone pill daily for the next 10 days. Call if irregular bleeding continues. Otherwise follow up when you're due for your annual exam in February

## 2016-03-14 NOTE — Progress Notes (Signed)
    Pamela Martinez [birth year] [id number]        45 y.o.  G2P2 presents for follow up IUD check. Did well after initial placement 02/10/2016. Started spotting on and off starting 03/02/2016 until now. No cramping or discomfort. No history of same before with her prior IUDs. Was having light menses the last 6 months of her previous IUD.  Past medical history,surgical history, problem list, medications, allergies, family history and social history were all reviewed and documented in the EPIC chart.  Directed ROS with pertinent positives and negatives documented in the history of present illness/assessment and plan.  Exam: Caryn Bee assistant Vitals:   03/14/16 1529  BP: 118/76   General appearance:  Normal Abdomen soft nontender without masses guarding rebound Pelvic external BUS vagina normal with no evidence of bleeding. Cervix normal. IUD string visualized in appropriate length. Uterus normal size midline mobile nontender. Adnexa without masses or tenderness.  Assessment/Plan:  45 y.o. G2P2 with some dysfunctional bleeding first month into new IUD. Options reviewed to include expectant management versus hormonal suppression. Patient would prefer hormonal suppression and will go with Megace 20 mg daily 10 days. Follow up if bleeding persists, worsens or recurs. Follow up in February 2018 when due for annual exam.    Anastasio Auerbach MD, 3:40 PM 03/14/2016

## 2016-05-16 ENCOUNTER — Other Ambulatory Visit: Payer: Self-pay | Admitting: Women's Health

## 2016-05-16 DIAGNOSIS — Z1231 Encounter for screening mammogram for malignant neoplasm of breast: Secondary | ICD-10-CM

## 2016-06-15 ENCOUNTER — Ambulatory Visit: Payer: BC Managed Care – PPO

## 2016-06-20 ENCOUNTER — Ambulatory Visit
Admission: RE | Admit: 2016-06-20 | Discharge: 2016-06-20 | Disposition: A | Discharge status: Home / Self Care | Payer: BC Managed Care – PPO | Source: Ambulatory Visit | Attending: Women's Health | Admitting: Women's Health

## 2016-06-20 DIAGNOSIS — Z1231 Encounter for screening mammogram for malignant neoplasm of breast: Secondary | ICD-10-CM

## 2016-06-22 ENCOUNTER — Other Ambulatory Visit: Payer: Self-pay | Admitting: Women's Health

## 2016-06-22 DIAGNOSIS — R928 Other abnormal and inconclusive findings on diagnostic imaging of breast: Secondary | ICD-10-CM

## 2016-06-26 ENCOUNTER — Encounter: Payer: Self-pay | Admitting: Women's Health

## 2016-06-26 ENCOUNTER — Ambulatory Visit
Admission: RE | Admit: 2016-06-26 | Discharge: 2016-06-26 | Disposition: A | Discharge status: Home / Self Care | Payer: BC Managed Care – PPO | Source: Ambulatory Visit | Attending: Women's Health | Admitting: Women's Health

## 2016-06-26 DIAGNOSIS — R928 Other abnormal and inconclusive findings on diagnostic imaging of breast: Secondary | ICD-10-CM

## 2016-06-27 ENCOUNTER — Encounter: Payer: Self-pay | Admitting: Women's Health

## 2016-08-17 ENCOUNTER — Encounter: Payer: BC Managed Care – PPO | Admitting: Women's Health

## 2016-08-24 ENCOUNTER — Encounter: Payer: Self-pay | Admitting: Women's Health

## 2016-08-24 ENCOUNTER — Ambulatory Visit (INDEPENDENT_AMBULATORY_CARE_PROVIDER_SITE_OTHER): Payer: BC Managed Care – PPO | Admitting: Women's Health

## 2016-08-24 VITALS — BP 132/80 | Ht 64.5 in | Wt 169.0 lb

## 2016-08-24 DIAGNOSIS — Z01419 Encounter for gynecological examination (general) (routine) without abnormal findings: Secondary | ICD-10-CM

## 2016-08-24 DIAGNOSIS — F411 Generalized anxiety disorder: Secondary | ICD-10-CM | POA: Diagnosis not present

## 2016-08-24 DIAGNOSIS — Z13220 Encounter for screening for lipoid disorders: Secondary | ICD-10-CM

## 2016-08-24 LAB — COMPREHENSIVE METABOLIC PANEL
ALBUMIN: 4.2 g/dL (ref 3.6–5.1)
ALT: 26 U/L (ref 6–29)
AST: 19 U/L (ref 10–35)
Alkaline Phosphatase: 63 U/L (ref 33–115)
BUN: 15 mg/dL (ref 7–25)
CHLORIDE: 107 mmol/L (ref 98–110)
CO2: 20 mmol/L (ref 20–31)
CREATININE: 0.87 mg/dL (ref 0.50–1.10)
Calcium: 9.3 mg/dL (ref 8.6–10.2)
GLUCOSE: 79 mg/dL (ref 65–99)
Potassium: 3.5 mmol/L (ref 3.5–5.3)
SODIUM: 139 mmol/L (ref 135–146)
Total Bilirubin: 0.7 mg/dL (ref 0.2–1.2)
Total Protein: 6.6 g/dL (ref 6.1–8.1)

## 2016-08-24 LAB — CBC WITH DIFFERENTIAL/PLATELET
BASOS ABS: 61 {cells}/uL (ref 0–200)
Basophils Relative: 1 %
Eosinophils Absolute: 183 cells/uL (ref 15–500)
Eosinophils Relative: 3 %
HCT: 44.2 % (ref 35.0–45.0)
HEMOGLOBIN: 15 g/dL (ref 11.7–15.5)
LYMPHS PCT: 35 %
Lymphs Abs: 2135 cells/uL (ref 850–3900)
MCH: 32.6 pg (ref 27.0–33.0)
MCHC: 33.9 g/dL (ref 32.0–36.0)
MCV: 96.1 fL (ref 80.0–100.0)
MONOS PCT: 7 %
MPV: 10.6 fL (ref 7.5–12.5)
Monocytes Absolute: 427 cells/uL (ref 200–950)
NEUTROS PCT: 54 %
Neutro Abs: 3294 cells/uL (ref 1500–7800)
Platelets: 258 10*3/uL (ref 140–400)
RBC: 4.6 MIL/uL (ref 3.80–5.10)
RDW: 12.9 % (ref 11.0–15.0)
WBC: 6.1 10*3/uL (ref 3.8–10.8)

## 2016-08-24 LAB — LIPID PANEL
Cholesterol: 172 mg/dL (ref <200)
HDL: 48 mg/dL — AB (ref >50)
LDL CALC: 104 mg/dL — AB (ref <100)
Total CHOL/HDL Ratio: 3.6 Ratio (ref <5.0)
Triglycerides: 102 mg/dL (ref <150)
VLDL: 20 mg/dL (ref <30)

## 2016-08-24 MED ORDER — ALPRAZOLAM 0.5 MG PO TABS: 0.5000 mg | ORAL_TABLET | Freq: Every evening | ORAL | 1 refills | Status: DC | PRN

## 2016-08-24 NOTE — Patient Instructions (Signed)
Colonoscopy [phone]  Dr Carlean Purl  [facility]  Health Maintenance, Female Introduction Adopting a healthy lifestyle and getting preventive care can go a long way to promote health and wellness. Talk with your health care provider about what schedule of regular examinations is right for you. This is a good chance for you to check in with your provider about disease prevention and staying healthy. In between checkups, there are plenty of things you can do on your own. Experts have done a lot of research about which lifestyle changes and preventive measures are most likely to keep you healthy. Ask your health care provider for more information. Weight and diet Eat a healthy diet  Be sure to include plenty of vegetables, fruits, low-fat dairy products, and lean protein.  Do not eat a lot of foods high in solid fats, added sugars, or salt.  Get regular exercise. This is one of the most important things you can do for your health.  Most adults should exercise for at least 150 minutes each week. The exercise should increase your heart rate and make you sweat (moderate-intensity exercise).  Most adults should also do strengthening exercises at least twice a week. This is in addition to the moderate-intensity exercise. Maintain a healthy weight  Body mass index (BMI) is a measurement that can be used to identify possible weight problems. It estimates body fat based on height and weight. Your health care provider can help determine your BMI and help you achieve or maintain a healthy weight.  For females 48 years of age and older:  A BMI below 18.5 is considered underweight.  A BMI of 18.5 to 24.9 is normal.  A BMI of 25 to 29.9 is considered overweight.  A BMI of 30 and above is considered obese. Watch levels of cholesterol and blood lipids  You should start having your blood tested for lipids and cholesterol at 48 years of age, then have this test every 5 years.  You may need to have your  cholesterol levels checked more often if:  Your lipid or cholesterol levels are high.  You are older than 48 years of age.  You are at high risk for heart disease. Cancer screening Lung Cancer  Lung cancer screening is recommended for adults 6-30 years old who are at high risk for lung cancer because of a history of smoking.  A yearly low-dose CT scan of the lungs is recommended for people who:  Currently smoke.  Have quit within the past 15 years.  Have at least a 30-pack-year history of smoking. A pack year is smoking an average of one pack of cigarettes a day for 1 year.  Yearly screening should continue until it has been 15 years since you quit.  Yearly screening should stop if you develop a health problem that would prevent you from having lung cancer treatment. Breast Cancer  Practice breast self-awareness. This means understanding how your breasts normally appear and feel.  It also means doing regular breast self-exams. Let your health care provider know about any changes, no matter how small.  If you are in your 20s or 30s, you should have a clinical breast exam (CBE) by a health care provider every 1-3 years as part of a regular health exam.  If you are 68 or older, have a CBE every year. Also consider having a breast X-ray (mammogram) every year.  If you have a family history of breast cancer, talk to your health care provider about genetic screening.  screening.  If you are at high risk for breast cancer, talk to your health care provider about having an MRI and a mammogram every year.  Breast cancer gene (BRCA) assessment is recommended for women who have family members with BRCA-related cancers. BRCA-related cancers include:  Breast.  Ovarian.  Tubal.  Peritoneal cancers.  Results of the assessment will determine the need for genetic counseling and BRCA1 and BRCA2 testing. Cervical Cancer  Your health care provider may recommend that you be screened regularly for  cancer of the pelvic organs (ovaries, uterus, and vagina). This screening involves a pelvic examination, including checking for microscopic changes to the surface of your cervix (Pap test). You may be encouraged to have this screening done every 3 years, beginning at age 21.  For women ages 30-65, health care providers may recommend pelvic exams and Pap testing every 3 years, or they may recommend the Pap and pelvic exam, combined with testing for human papilloma virus (HPV), every 5 years. Some types of HPV increase your risk of cervical cancer. Testing for HPV may also be done on women of any age with unclear Pap test results.  Other health care providers may not recommend any screening for nonpregnant women who are considered low risk for pelvic cancer and who do not have symptoms. Ask your health care provider if a screening pelvic exam is right for you.  If you have had past treatment for cervical cancer or a condition that could lead to cancer, you need Pap tests and screening for cancer for at least 20 years after your treatment. If Pap tests have been discontinued, your risk factors (such as having a new sexual partner) need to be reassessed to determine if screening should resume. Some women have medical problems that increase the chance of getting cervical cancer. In these cases, your health care provider may recommend more frequent screening and Pap tests. Colorectal Cancer  This type of cancer can be detected and often prevented.  Routine colorectal cancer screening usually begins at 48 years of age and continues through 48 years of age.  Your health care provider may recommend screening at an earlier age if you have risk factors for colon cancer.  Your health care provider may also recommend using home test kits to check for hidden blood in the stool.  A small camera at the end of a tube can be used to examine your colon directly (sigmoidoscopy or colonoscopy). This is done to check for  the earliest forms of colorectal cancer.  Routine screening usually begins at age 50.  Direct examination of the colon should be repeated every 5-10 years through 48 years of age. However, you may need to be screened more often if early forms of precancerous polyps or small growths are found. Skin Cancer  Check your skin from head to toe regularly.  Tell your health care provider about any new moles or changes in moles, especially if there is a change in a mole's shape or color.  Also tell your health care provider if you have a mole that is larger than the size of a pencil eraser.  Always use sunscreen. Apply sunscreen liberally and repeatedly throughout the day.  Protect yourself by wearing long sleeves, pants, a wide-brimmed hat, and sunglasses whenever you are outside. Heart disease, diabetes, and high blood pressure  High blood pressure causes heart disease and increases the risk of stroke. High blood pressure is more likely to develop in:  People who have blood pressure   in the high end of the normal range (130-139/85-89 mm Hg).  People who are overweight or obese.  People who are African American.  If you are 18-39 years of age, have your blood pressure checked every 3-5 years. If you are 40 years of age or older, have your blood pressure checked every year. You should have your blood pressure measured twice-once when you are at a hospital or clinic, and once when you are not at a hospital or clinic. Record the average of the two measurements. To check your blood pressure when you are not at a hospital or clinic, you can use:  An automated blood pressure machine at a pharmacy.  A home blood pressure monitor.  If you are between 55 years and 79 years old, ask your health care provider if you should take aspirin to prevent strokes.  Have regular diabetes screenings. This involves taking a blood sample to check your fasting blood sugar level.  If you are at a normal weight and  have a low risk for diabetes, have this test once every three years after 48 years of age.  If you are overweight and have a high risk for diabetes, consider being tested at a younger age or more often. Preventing infection Hepatitis B  If you have a higher risk for hepatitis B, you should be screened for this virus. You are considered at high risk for hepatitis B if:  You were born in a country where hepatitis B is common. Ask your health care provider which countries are considered high risk.  Your parents were born in a high-risk country, and you have not been immunized against hepatitis B (hepatitis B vaccine).  You have HIV or AIDS.  You use needles to inject street drugs.  You live with someone who has hepatitis B.  You have had sex with someone who has hepatitis B.  You get hemodialysis treatment.  You take certain medicines for conditions, including cancer, organ transplantation, and autoimmune conditions. Hepatitis C  Blood testing is recommended for:  Everyone born from 1945 through 1965.  Anyone with known risk factors for hepatitis C. Sexually transmitted infections (STIs)  You should be screened for sexually transmitted infections (STIs) including gonorrhea and chlamydia if:  You are sexually active and are younger than 48 years of age.  You are older than 48 years of age and your health care provider tells you that you are at risk for this type of infection.  Your sexual activity has changed since you were last screened and you are at an increased risk for chlamydia or gonorrhea. Ask your health care provider if you are at risk.  If you do not have HIV, but are at risk, it may be recommended that you take a prescription medicine daily to prevent HIV infection. This is called pre-exposure prophylaxis (PrEP). You are considered at risk if:  You are sexually active and do not regularly use condoms or know the HIV status of your partner(s).  You take drugs by  injection.  You are sexually active with a partner who has HIV. Talk with your health care provider about whether you are at high risk of being infected with HIV. If you choose to begin PrEP, you should first be tested for HIV. You should then be tested every 3 months for as long as you are taking PrEP. Pregnancy  If you are premenopausal and you may become pregnant, ask your health care provider about preconception counseling.  If you   pregnant, take 400 to 800 micrograms (mcg) of folic acid every day.  If you want to prevent pregnancy, talk to your health care provider about birth control (contraception). Osteoporosis and menopause  Osteoporosis is a disease in which the bones lose minerals and strength with aging. This can result in serious bone fractures. Your risk for osteoporosis can be identified using a bone density scan.  If you are 65 years of age or older, or if you are at risk for osteoporosis and fractures, ask your health care provider if you should be screened.  Ask your health care provider whether you should take a calcium or vitamin D supplement to lower your risk for osteoporosis.  Menopause may have certain physical symptoms and risks.  Hormone replacement therapy may reduce some of these symptoms and risks. Talk to your health care provider about whether hormone replacement therapy is right for you. Follow these instructions at home:  Schedule regular health, dental, and eye exams.  Stay current with your immunizations.  Do not use any tobacco products including cigarettes, chewing tobacco, or electronic cigarettes.  If you are pregnant, do not drink alcohol.  If you are breastfeeding, limit how much and how often you drink alcohol.  Limit alcohol intake to no more than 1 drink per day for nonpregnant women. One drink equals 12 ounces of beer, 5 ounces of wine, or 1 ounces of hard liquor.  Do not use street drugs.  Do not share needles.  Ask  your health care provider for help if you need support or information about quitting drugs.  Tell your health care provider if you often feel depressed.  Tell your health care provider if you have ever been abused or do not feel safe at home. This information is not intended to replace advice given to you by your health care provider. Make sure you discuss any questions you have with your health care provider. Document Released: 01/08/2011 Document Revised: 12/01/2015 Document Reviewed: 03/29/2015  2017 Elsevier  

## 2016-08-24 NOTE — Progress Notes (Signed)
[place] June 26, 1971 [id number]    History:    Presents for annual exam. 8 /2017 Mirena IUD Amenorrheic   Denies abd pain, pain during sex, nipple discharge, or vaginal dryness. [place] 15 positive HR HPV with negative biopsy, Pap 2017 normal with negative HR HPV. Normal mammogram history. Restless leg causing poor sleep from discomfort relieved by Motrin and/or Xanax.  Past medical history, past surgical history, family history and social history were all reviewed and documented in the EPIC chart. Married since September. Stressed with family tension with in laws. Children are now 29 and 9 both doing well, stepdaughter living at home. Works for court system.  ROS:  A ROS was performed and pertinent positives and negatives are included.  Exam:  Vitals:   08/24/16 1439  BP: 132/80  Weight: 169 lb (76.7 kg)  Height: 5' 4.5" (1.638 m)   Body mass index is 28.56 kg/m.   General appearance:  Normal Thyroid:  Symmetrical, normal in size, without palpable masses or nodularity. Respiratory  Auscultation:  Clear without wheezing or rhonchi Cardiovascular  Auscultation:  Regular rate, without rubs, murmurs or gallops  Edema/varicosities:  Not grossly evident Abdominal  Soft,nontender, without masses, guarding or rebound.  Liver/spleen:  No organomegaly noted  Hernia:  None appreciated  Skin  Inspection:  Grossly normal   Breasts: Examined lying and sitting.     Right: Without masses, retractions, discharge or axillary adenopathy. Dense breast tissue.     Left: Without masses, retractions, discharge or axillary adenopathy. Gentitourinary   Inguinal/mons:  Normal without inguinal adenopathy  External genitalia:  Normal  BUS/Urethra/Skene's glands:  Normal  Vagina:  Normal  Cervix:  Normal; IUD string visualized  Uterus:  normal in size, shape and contour.  Midline and mobile  Adnexa/parametria:     Rt: Without masses or tenderness.   Lt: Without masses or  tenderness.  Anus and perineum: Normal  Digital rectal exam: Normal sphincter tone without palpated masses or tenderness  Assessment/Plan:  46 y.o. MWF G2P2  for annual exam.    02/2016/Mirena IUD- rare bleeding 2015 positive high risk HPV negative colposcopy Restless leg causing poor sleep him discomfort  Plan- Encouraged healthy eating and exercise. Congratulated on 4 lb wt loss and getting married. SBEs. Continue 3D mammograms. Xanax 0.5 prescription, proper use, addictive properties reviewed instructed to use sparingly. Follow-up with primary care, Motrin as needed. CBC, CMP, lipid panel, Pap Instructed to call if she has any questions or concerns.    Huel Cote [facility], 4:08 PM 08/24/2016

## 2016-08-27 LAB — PAP IG W/ RFLX HPV ASCU

## 2016-08-30 ENCOUNTER — Encounter: Payer: Self-pay | Admitting: Women's Health

## 2016-08-30 LAB — HUMAN PAPILLOMAVIRUS, HIGH RISK: HPV DNA High Risk: NOT DETECTED

## 2017-01-16 ENCOUNTER — Other Ambulatory Visit: Payer: Self-pay | Admitting: Women's Health

## 2017-01-16 DIAGNOSIS — F411 Generalized anxiety disorder: Secondary | ICD-10-CM

## 2017-01-17 NOTE — Telephone Encounter (Signed)
Nancy's patient. Annual exam with [place] was 08/24/16. "Restless leg causing poor sleep from discomfort relieved by Motrin and/or Xanax."

## 2017-01-17 NOTE — Telephone Encounter (Signed)
Called into pharmacy

## 2017-04-01 ENCOUNTER — Telehealth: Payer: Self-pay | Admitting: *Deleted

## 2017-04-01 NOTE — Telephone Encounter (Signed)
Estill Bamberg called from [place] aid stating pt was trying to refill Xanax 0.5 mg, but Dr.Fernandez DEA showed inactive. I called and explained her has retired and Dr.Fontaine is back up MD. Estill Bamberg aware of this and will fill Rx.

## 2017-06-03 ENCOUNTER — Other Ambulatory Visit: Payer: Self-pay | Admitting: Gynecology

## 2017-06-03 DIAGNOSIS — F411 Generalized anxiety disorder: Secondary | ICD-10-CM

## 2017-06-04 NOTE — Telephone Encounter (Signed)
Okay for refill, review best not to take daily, reviewed addictive properties.

## 2017-06-04 NOTE — Telephone Encounter (Signed)
Annual scheduled on 09/02/17

## 2017-06-05 NOTE — Telephone Encounter (Signed)
Rx called in 

## 2017-08-01 ENCOUNTER — Other Ambulatory Visit: Payer: Self-pay | Admitting: *Deleted

## 2017-08-01 DIAGNOSIS — F411 Generalized anxiety disorder: Secondary | ICD-10-CM

## 2017-08-01 MED ORDER — ALPRAZOLAM 0.5 MG PO TABS: ORAL_TABLET | ORAL | 1 refills | Status: DC

## 2017-08-01 NOTE — Telephone Encounter (Signed)
[place] for refill but inform best not to take daily, addictive

## 2017-08-02 NOTE — Telephone Encounter (Signed)
Rx called in 

## 2017-08-07 ENCOUNTER — Other Ambulatory Visit: Payer: Self-pay | Admitting: Women's Health

## 2017-08-07 DIAGNOSIS — Z1231 Encounter for screening mammogram for malignant neoplasm of breast: Secondary | ICD-10-CM

## 2017-08-23 ENCOUNTER — Ambulatory Visit
Admission: RE | Admit: 2017-08-23 | Discharge: 2017-08-23 | Disposition: A | Discharge status: Home / Self Care | Payer: BC Managed Care – PPO | Source: Ambulatory Visit | Attending: Women's Health | Admitting: Women's Health

## 2017-08-23 DIAGNOSIS — Z1231 Encounter for screening mammogram for malignant neoplasm of breast: Secondary | ICD-10-CM

## 2017-08-27 ENCOUNTER — Encounter: Payer: Self-pay | Admitting: Women's Health

## 2017-09-02 ENCOUNTER — Ambulatory Visit (INDEPENDENT_AMBULATORY_CARE_PROVIDER_SITE_OTHER): Payer: BC Managed Care – PPO | Admitting: Women's Health

## 2017-09-02 ENCOUNTER — Encounter: Payer: Self-pay | Admitting: Women's Health

## 2017-09-02 VITALS — BP 120/80 | Ht 64.0 in | Wt 179.0 lb

## 2017-09-02 DIAGNOSIS — Z13220 Encounter for screening for lipoid disorders: Secondary | ICD-10-CM | POA: Diagnosis not present

## 2017-09-02 DIAGNOSIS — Z01419 Encounter for gynecological examination (general) (routine) without abnormal findings: Secondary | ICD-10-CM | POA: Diagnosis not present

## 2017-09-02 LAB — CBC WITH DIFFERENTIAL/PLATELET
BASOS ABS: 68 {cells}/uL (ref 0–200)
Basophils Relative: 0.8 %
Eosinophils Absolute: 128 cells/uL (ref 15–500)
Eosinophils Relative: 1.5 %
HEMATOCRIT: 43.8 % (ref 35.0–45.0)
Hemoglobin: 15.2 g/dL (ref 11.7–15.5)
LYMPHS ABS: 2355 {cells}/uL (ref 850–3900)
MCH: 32.6 pg (ref 27.0–33.0)
MCHC: 34.7 g/dL (ref 32.0–36.0)
MCV: 94 fL (ref 80.0–100.0)
MPV: 11.8 fL (ref 7.5–12.5)
Monocytes Relative: 7.5 %
NEUTROS PCT: 62.5 %
Neutro Abs: 5313 cells/uL (ref 1500–7800)
Platelets: 212 10*3/uL (ref 140–400)
RBC: 4.66 10*6/uL (ref 3.80–5.10)
RDW: 11.9 % (ref 11.0–15.0)
Total Lymphocyte: 27.7 %
WBC: 8.5 10*3/uL (ref 3.8–10.8)
WBCMIX: 638 {cells}/uL (ref 200–950)

## 2017-09-02 LAB — LIPID PANEL
CHOLESTEROL: 199 mg/dL (ref <200)
HDL: 47 mg/dL — ABNORMAL LOW (ref >50)
LDL CHOLESTEROL (CALC): 130 mg/dL — AB
Non-HDL Cholesterol (Calc): 152 mg/dL (calc) — ABNORMAL HIGH (ref <130)
Total CHOL/HDL Ratio: 4.2 (calc) (ref <5.0)
Triglycerides: 112 mg/dL (ref <150)

## 2017-09-02 LAB — COMPREHENSIVE METABOLIC PANEL
AG Ratio: 2 (calc) (ref 1.0–2.5)
ALBUMIN MSPROF: 4.4 g/dL (ref 3.6–5.1)
ALKALINE PHOSPHATASE (APISO): 65 U/L (ref 33–115)
ALT: 35 U/L — AB (ref 6–29)
AST: 19 U/L (ref 10–35)
BILIRUBIN TOTAL: 0.8 mg/dL (ref 0.2–1.2)
BUN: 16 mg/dL (ref 7–25)
CALCIUM: 9.2 mg/dL (ref 8.6–10.2)
CO2: 24 mmol/L (ref 20–32)
Chloride: 106 mmol/L (ref 98–110)
Creat: 0.81 mg/dL (ref 0.50–1.10)
Globulin: 2.2 g/dL (calc) (ref 1.9–3.7)
Glucose, Bld: 84 mg/dL (ref 65–99)
POTASSIUM: 3.5 mmol/L (ref 3.5–5.3)
Sodium: 137 mmol/L (ref 135–146)
Total Protein: 6.6 g/dL (ref 6.1–8.1)

## 2017-09-02 NOTE — Progress Notes (Signed)
[place] 08/10/1970 [id number]    History:    Presents for annual exam. 02/2016 Mirena IUD amenorrheic. 2015 ascus with negative colposcopy and biopsy, 2018 ASCUS negative HR HPV. Normal Pap history. Primary care manages migraines and asthma.  Past medical history, past surgical history, family history and social history were all reviewed and documented in the EPIC chart. Works in the court system and Theme park manager. Son 49, daughter 52 both doing well and one stepdaughter age 25. Continues to have a stressful relationship with in-laws.  ROS:  A ROS was performed and pertinent positives and negatives are included.  Exam:  Vitals:   09/02/17 1532  BP: 120/80  Weight: 179 lb (81.2 kg)  Height: 5\' 4"  (1.626 m)   Body mass index is 30.73 kg/m.   General appearance:  Normal Thyroid:  Symmetrical, normal in size, without palpable masses or nodularity. Respiratory  Auscultation:  Clear without wheezing or rhonchi Cardiovascular  Auscultation:  Regular rate, without rubs, murmurs or gallops  Edema/varicosities:  Not grossly evident Abdominal  Soft,nontender, without masses, guarding or rebound.  Liver/spleen:  No organomegaly noted  Hernia:  None appreciated  Skin  Inspection:  Grossly normal   Breasts: Examined lying and sitting.     Right: Without masses, retractions, discharge or axillary adenopathy.     Left: Without masses, retractions, discharge or axillary adenopathy. Gentitourinary   Inguinal/mons:  Normal without inguinal adenopathy  External genitalia:  Normal  BUS/Urethra/Skene's glands:  Normal  Vagina:  Normal  Cervix:  Normal IUD strings visible  Uterus:   normal in size, shape and contour.  Midline and mobile  Adnexa/parametria:     Rt: Without masses or tenderness.   Lt: Without masses or tenderness.  Anus and perineum: Normal  Digital rectal exam: Normal sphincter tone without palpated masses or tenderness  Assessment/Plan:  47 y.o. MWF G2 P2 for  annual exam with no complaints.  02/2016 Mirena IUD-amenorrhea Persistent ASCUS  Plan: SBE's, continue annual screening mammogram, calcium rich diet, vitamin D 1000 daily encouraged. Reviewed importance of increasing exercise and decreasing calories/carbs for weight loss, has gained 10 pounds in the past year. Screening colonoscopy at 26 encouraged, [facility] information given. CBC, CMP, lipid panel, Pap.  [place], 4:58 PM 09/02/2017

## 2017-09-02 NOTE — Patient Instructions (Signed)
[facility]  Dr Carlean Purl  [phone]  Health Maintenance, Female Adopting a healthy lifestyle and getting preventive care can go a long way to promote health and wellness. Talk with your health care provider about what schedule of regular examinations is right for you. This is a good chance for you to check in with your provider about disease prevention and staying healthy. In between checkups, there are plenty of things you can do on your own. Experts have done a lot of research about which lifestyle changes and preventive measures are most likely to keep you healthy. Ask your health care provider for more information. Weight and diet Eat a healthy diet  Be sure to include plenty of vegetables, fruits, low-fat dairy products, and lean protein.  Do not eat a lot of foods high in solid fats, added sugars, or salt.  Get regular exercise. This is one of the most important things you can do for your health. ? Most adults should exercise for at least 150 minutes each week. The exercise should increase your heart rate and make you sweat (moderate-intensity exercise). ? Most adults should also do strengthening exercises at least twice a week. This is in addition to the moderate-intensity exercise.  Maintain a healthy weight  Body mass index (BMI) is a measurement that can be used to identify possible weight problems. It estimates body fat based on height and weight. Your health care provider can help determine your BMI and help you achieve or maintain a healthy weight.  For females 61 years of age and older: ? A BMI below 18.5 is considered underweight. ? A BMI of 18.5 to 24.9 is normal. ? A BMI of 25 to 29.9 is considered overweight. ? A BMI of 30 and above is considered obese.  Watch levels of cholesterol and blood lipids  You should start having your blood tested for lipids and cholesterol at 49 years of age, then have this test every 5 years.  You may need to have your cholesterol levels  checked more often if: ? Your lipid or cholesterol levels are high. ? You are older than 49 years of age. ? You are at high risk for heart disease.  Cancer screening Lung Cancer  Lung cancer screening is recommended for adults 50-83 years old who are at high risk for lung cancer because of a history of smoking.  A yearly low-dose CT scan of the lungs is recommended for people who: ? Currently smoke. ? Have quit within the past 15 years. ? Have at least a 30-pack-year history of smoking. A pack year is smoking an average of one pack of cigarettes a day for 1 year.  Yearly screening should continue until it has been 15 years since you quit.  Yearly screening should stop if you develop a health problem that would prevent you from having lung cancer treatment.  Breast Cancer  Practice breast self-awareness. This means understanding how your breasts normally appear and feel.  It also means doing regular breast self-exams. Let your health care provider know about any changes, no matter how small.  If you are in your 20s or 30s, you should have a clinical breast exam (CBE) by a health care provider every 1-3 years as part of a regular health exam.  If you are 69 or older, have a CBE every year. Also consider having a breast X-ray (mammogram) every year.  If you have a family history of breast cancer, talk to your health care provider about genetic  screening.  If you are at high risk for breast cancer, talk to your health care provider about having an MRI and a mammogram every year.  Breast cancer gene (BRCA) assessment is recommended for women who have family members with BRCA-related cancers. BRCA-related cancers include: ? Breast. ? Ovarian. ? Tubal. ? Peritoneal cancers.  Results of the assessment will determine the need for genetic counseling and BRCA1 and BRCA2 testing.  Cervical Cancer Your health care provider may recommend that you be screened regularly for cancer of the  pelvic organs (ovaries, uterus, and vagina). This screening involves a pelvic examination, including checking for microscopic changes to the surface of your cervix (Pap test). You may be encouraged to have this screening done every 3 years, beginning at age 21.  For women ages 30-65, health care providers may recommend pelvic exams and Pap testing every 3 years, or they may recommend the Pap and pelvic exam, combined with testing for human papilloma virus (HPV), every 5 years. Some types of HPV increase your risk of cervical cancer. Testing for HPV may also be done on women of any age with unclear Pap test results.  Other health care providers may not recommend any screening for nonpregnant women who are considered low risk for pelvic cancer and who do not have symptoms. Ask your health care provider if a screening pelvic exam is right for you.  If you have had past treatment for cervical cancer or a condition that could lead to cancer, you need Pap tests and screening for cancer for at least 20 years after your treatment. If Pap tests have been discontinued, your risk factors (such as having a new sexual partner) need to be reassessed to determine if screening should resume. Some women have medical problems that increase the chance of getting cervical cancer. In these cases, your health care provider may recommend more frequent screening and Pap tests.  Colorectal Cancer  This type of cancer can be detected and often prevented.  Routine colorectal cancer screening usually begins at 50 years of age and continues through 49 years of age.  Your health care provider may recommend screening at an earlier age if you have risk factors for colon cancer.  Your health care provider may also recommend using home test kits to check for hidden blood in the stool.  A small camera at the end of a tube can be used to examine your colon directly (sigmoidoscopy or colonoscopy). This is done to check for the  earliest forms of colorectal cancer.  Routine screening usually begins at age 50.  Direct examination of the colon should be repeated every 5-10 years through 49 years of age. However, you may need to be screened more often if early forms of precancerous polyps or small growths are found.  Skin Cancer  Check your skin from head to toe regularly.  Tell your health care provider about any new moles or changes in moles, especially if there is a change in a mole's shape or color.  Also tell your health care provider if you have a mole that is larger than the size of a pencil eraser.  Always use sunscreen. Apply sunscreen liberally and repeatedly throughout the day.  Protect yourself by wearing long sleeves, pants, a wide-brimmed hat, and sunglasses whenever you are outside.  Heart disease, diabetes, and high blood pressure  High blood pressure causes heart disease and increases the risk of stroke. High blood pressure is more likely to develop in: ? People who   have blood pressure in the high end of the normal range (130-139/85-89 mm Hg). ? People who are overweight or obese. ? People who are African American.  If you are 19-29 years of age, have your blood pressure checked every 3-5 years. If you are 30 years of age or older, have your blood pressure checked every year. You should have your blood pressure measured twice-once when you are at a hospital or clinic, and once when you are not at a hospital or clinic. Record the average of the two measurements. To check your blood pressure when you are not at a hospital or clinic, you can use: ? An automated blood pressure machine at a pharmacy. ? A home blood pressure monitor.  If you are between 65 years and 63 years old, ask your health care provider if you should take aspirin to prevent strokes.  Have regular diabetes screenings. This involves taking a blood sample to check your fasting blood sugar level. ? If you are at a normal weight and  have a low risk for diabetes, have this test once every three years after 49 years of age. ? If you are overweight and have a high risk for diabetes, consider being tested at a younger age or more often. Preventing infection Hepatitis B  If you have a higher risk for hepatitis B, you should be screened for this virus. You are considered at high risk for hepatitis B if: ? You were born in a country where hepatitis B is common. Ask your health care provider which countries are considered high risk. ? Your parents were born in a high-risk country, and you have not been immunized against hepatitis B (hepatitis B vaccine). ? You have HIV or AIDS. ? You use needles to inject street drugs. ? You live with someone who has hepatitis B. ? You have had sex with someone who has hepatitis B. ? You get hemodialysis treatment. ? You take certain medicines for conditions, including cancer, organ transplantation, and autoimmune conditions.  Hepatitis C  Blood testing is recommended for: ? Everyone born from [birth year] through 1967. ? Anyone with known risk factors for hepatitis C.  Sexually transmitted infections (STIs)  You should be screened for sexually transmitted infections (STIs) including gonorrhea and chlamydia if: ? You are sexually active and are younger than 49 years of age. ? You are older than 49 years of age and your health care provider tells you that you are at risk for this type of infection. ? Your sexual activity has changed since you were last screened and you are at an increased risk for chlamydia or gonorrhea. Ask your health care provider if you are at risk.  If you do not have HIV, but are at risk, it may be recommended that you take a prescription medicine daily to prevent HIV infection. This is called pre-exposure prophylaxis (PrEP). You are considered at risk if: ? You are sexually active and do not regularly use condoms or know the HIV status of your partner(s). ? You take drugs by  injection. ? You are sexually active with a partner who has HIV.  Talk with your health care provider about whether you are at high risk of being infected with HIV. If you choose to begin PrEP, you should first be tested for HIV. You should then be tested every 3 months for as long as you are taking PrEP. Pregnancy  If you are premenopausal and you may become pregnant, ask your health care provider  about preconception counseling.  If you may become pregnant, take 400 to 800 micrograms (mcg) of folic acid every day.  If you want to prevent pregnancy, talk to your health care provider about birth control (contraception). Osteoporosis and menopause  Osteoporosis is a disease in which the bones lose minerals and strength with aging. This can result in serious bone fractures. Your risk for osteoporosis can be identified using a bone density scan.  If you are 6 years of age or older, or if you are at risk for osteoporosis and fractures, ask your health care provider if you should be screened.  Ask your health care provider whether you should take a calcium or vitamin D supplement to lower your risk for osteoporosis.  Menopause may have certain physical symptoms and risks.  Hormone replacement therapy may reduce some of these symptoms and risks. Talk to your health care provider about whether hormone replacement therapy is right for you. Follow these instructions at home:  Schedule regular health, dental, and eye exams.  Stay current with your immunizations.  Do not use any tobacco products including cigarettes, chewing tobacco, or electronic cigarettes.  If you are pregnant, do not drink alcohol.  If you are breastfeeding, limit how much and how often you drink alcohol.  Limit alcohol intake to no more than 1 drink per day for nonpregnant women. One drink equals 12 ounces of beer, 5 ounces of wine, or 1 ounces of hard liquor.  Do not use street drugs.  Do not share needles.  Ask  your health care provider for help if you need support or information about quitting drugs.  Tell your health care provider if you often feel depressed.  Tell your health care provider if you have ever been abused or do not feel safe at home. This information is not intended to replace advice given to you by your health care provider. Make sure you discuss any questions you have with your health care provider. Document Released: 01/08/2011 Document Revised: 12/01/2015 Document Reviewed: 03/29/2015 Elsevier Interactive Patient Education  [employer].

## 2017-09-05 LAB — PAP IG W/ RFLX HPV ASCU

## 2017-09-05 LAB — HUMAN PAPILLOMAVIRUS, HIGH RISK: HPV DNA HIGH RISK: NOT DETECTED

## 2017-10-08 ENCOUNTER — Other Ambulatory Visit: Payer: Self-pay | Admitting: Women's Health

## 2017-10-08 DIAGNOSIS — F411 Generalized anxiety disorder: Secondary | ICD-10-CM

## 2017-10-08 NOTE — Telephone Encounter (Signed)
Okay for refill?  

## 2017-10-08 NOTE — Telephone Encounter (Signed)
Rx called in 

## 2017-11-01 ENCOUNTER — Ambulatory Visit (HOSPITAL_COMMUNITY)
Admission: EM | Admit: 2017-11-01 | Discharge: 2017-11-01 | Disposition: A | Discharge status: Home / Self Care | Payer: BC Managed Care – PPO | Attending: Family Medicine | Admitting: Family Medicine

## 2017-11-01 ENCOUNTER — Encounter (HOSPITAL_COMMUNITY): Payer: Self-pay | Admitting: Emergency Medicine

## 2017-11-01 DIAGNOSIS — R21 Rash and other nonspecific skin eruption: Secondary | ICD-10-CM | POA: Diagnosis not present

## 2017-11-01 HISTORY — DX: Migraine, unspecified, not intractable, without status migrainosus: G43.909

## 2017-11-01 MED ORDER — METHYLPREDNISOLONE SODIUM SUCC 125 MG IJ SOLR
INTRAMUSCULAR | Status: AC
  Filled 2017-11-01: qty 2

## 2017-11-01 MED ORDER — METHYLPREDNISOLONE SODIUM SUCC 125 MG IJ SOLR
125.0000 mg | Freq: Once | INTRAMUSCULAR | Status: AC
  Administered 2017-11-01: 125 mg via INTRAMUSCULAR

## 2017-11-01 MED ORDER — PREDNISONE 50 MG PO TABS: 50.0000 mg | ORAL_TABLET | Freq: Every day | ORAL | 0 refills | Status: AC

## 2017-11-01 NOTE — ED Provider Notes (Signed)
[place]    CSN: [id number] Arrival date & time: 11/01/17  1007     History   Chief Complaint Chief Complaint  Patient presents with  . Rash    HPI Pamela Martinez is a 47 y.o. female history of allergic rhinitis presenting today for evaluation of a rash.  Patient states that she broke out in a rash Wednesday night and has persisted for the past 1 to 2 days.  Rash began on her upper extremities and is spread to her trunk and lower legs.  Feels like this rash is worse at night.  Rashes associated with itchiness.  Denies involvement of mouth or vaginal mucosa.  Denies any new soaps, lotions, detergents.  Is unsure of any new foods.  Did state that she finished a course of Bactrim on Monday, but is previously not had issues with this antibiotic.  She has been taking Benadryl and using hydrocortisone cream without relief.  HPI  Past Medical History:  Diagnosis Date  . Allergy   . 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 Migraine     Patient Active Problem List   Diagnosis Date Noted  . Mild dysplasia of cervix 03/17/2012    Past Surgical History:  Procedure Laterality Date  . INTRAUTERINE DEVICE INSERTION  02/10/2016   Mirena  . KNEE SURGERY     X 4 = 1X RIGHT 3X LEFT/3 ARTHROSCOPIES AND 1 ACL  . KNEE SURGERY      OB History    Gravida  2   Para  2   Term      Preterm      AB      Living  2     SAB      TAB      Ectopic      Multiple      Live Births               Home Medications    Prior to Admission medications   Medication Sig Start Date End Date Taking? Authorizing Provider  ALPRAZolam Duanne Moron) 0.5 MG tablet TAKE 1 TABLET BY MOUTH AT BEDTIME AS NEEDED FOR ANXIETY 10/08/17   Huel Cote, NP  fluticasone (FLONASE) 50 MCG/ACT nasal spray Place 2 sprays into both nostrils daily.    [provider]  MAGNESIUM PO Take  1 tablet by mouth daily.    [provider]  montelukast (SINGULAIR) 10 MG tablet Take 10 mg by mouth every evening. 05/31/15   [provider]  predniSONE (DELTASONE) 50 MG tablet Take 1 tablet (50 mg total) by mouth daily for 5 days. 11/01/17 11/06/17  Wieters, Hallie C, PA-C  Topiramate (TOPAMAX PO) Take by mouth.    [provider]  levonorgestrel (MIRENA) 20 MCG/24HR IUD 1 each by Intrauterine route once. INSERTED 05-01-06.   09/02/17  [provider]    Family History Family History  Problem Relation Age of Onset  . Cancer Father        THROAT  . Hyperlipidemia Father   . Hypertension Paternal Grandfather   . Cancer Paternal Grandfather        COLON  . Hyperlipidemia Brother   . Hypertension Brother   . Cancer Paternal Aunt        PRIMARY PERITONEAL CANCER    Social History Social History   Tobacco Use  .  Smoking status: Former Smoker    Types: Cigarettes    Last attempt to quit: 07/09/1988    Years since quitting: 29.3  . Smokeless tobacco: Never Used  Substance Use Topics  . Alcohol use: Yes  . Drug use: No     Allergies   Diflucan [fluconazole]   Review of Systems Review of Systems  Constitutional: Negative for fatigue and fever.  HENT: Negative for mouth sores.   Eyes: Negative for visual disturbance.  Respiratory: Negative for shortness of breath.   Cardiovascular: Negative for chest pain.  Gastrointestinal: Negative for abdominal pain, nausea and vomiting.  Genitourinary: Negative for genital sores.  Musculoskeletal: Negative for arthralgias and joint swelling.  Skin: Positive for color change and rash. Negative for wound.  Neurological: Negative for dizziness, weakness, light-headedness and headaches.     Physical Exam Triage Vital Signs ED Triage Vitals [11/01/17 1034]  Enc Vitals Group     BP 122/82     Pulse Rate 74     Resp 18     Temp 98.1 F (36.7 C)     Temp src      SpO2 100 %     Weight      Height       Head Circumference      Peak Flow      Pain Score      Pain Loc      Pain Edu?      Excl. in [place]?    No data found.  Updated Vital Signs BP 122/82   Pulse 74   Temp 98.1 F (36.7 C)   Resp 18   SpO2 100%   Visual Acuity Right Eye Distance:   Left Eye Distance:   Bilateral Distance:    Right Eye Near:   Left Eye Near:    Bilateral Near:     Physical Exam  Constitutional: She appears well-developed and well-nourished. No distress.  HENT:  Head: Normocephalic and atraumatic.  No lesions on oral mucosa  Eyes: Conjunctivae are normal.  Neck: Neck supple.  Cardiovascular: Normal rate.  No murmur heard. Pulmonary/Chest: Effort normal. No respiratory distress.  Musculoskeletal: She exhibits no edema.  Neurological: She is alert.  Skin: Skin is warm and dry. Rash noted. There is erythema.  Patient with macular, blanchable erythema diffusely across trunk, appears less concentrated on the upper and lower extremities.  Extending upward on the neck, but is not involving the face.  Psychiatric: She has a normal mood and affect.  Nursing note and vitals reviewed.    UC Treatments / Results  Labs (all labs ordered are listed, but only abnormal results are displayed) Labs Reviewed - No data to display  EKG None Radiology No results found.  Procedures Procedures (including critical care time)  Medications Ordered in UC Medications  methylPREDNISolone sodium succinate (SOLU-MEDROL) 125 mg/2 mL injection 125 mg (has no administration in time range)     Initial Impression / Assessment and Plan / UC Course  I have reviewed the triage vital signs and the nursing notes.  Pertinent labs & imaging results that were available during my care of the patient were reviewed by me and considered in my medical decision making (see chart for details).     Patient likely with rash related to allergic reaction.  Will provide Solu-Medrol in clinic today.  Sent home with daily  prednisone for 5 days beginning tomorrow.  May increase Zyrtec to twice daily, and Benadryl as needed. Discussed strict return precautions.  Patient verbalized understanding and is agreeable with plan.   Final Clinical Impressions(s) / UC Diagnoses   Final diagnoses:  Rash and nonspecific skin eruption    ED Discharge Orders        Ordered    predniSONE (DELTASONE) 50 MG tablet  Daily     11/01/17 1057       Controlled Substance Prescriptions [place] Controlled Substance Registry consulted? Not Applicable   Janith Lima, [place] 11/01/17 1102

## 2017-11-01 NOTE — ED Triage Notes (Signed)
Pt c/o rash on hands and feet, has tried benadryl and creams without relief.

## 2017-11-01 NOTE — Discharge Instructions (Signed)
We gave you a shot of Solu-Medrol today.  Please begin daily prednisone tomorrow.  You may continue to use Benadryl as needed, may increase daily Zyrtec/allergy pill to twice daily.  Please return if symptoms not improving over the weekend or return if symptoms are worsening or changing.

## 2017-12-12 ENCOUNTER — Other Ambulatory Visit: Payer: Self-pay | Admitting: Women's Health

## 2017-12-12 DIAGNOSIS — F411 Generalized anxiety disorder: Secondary | ICD-10-CM

## 2017-12-12 NOTE — Telephone Encounter (Signed)
Called into pharmacy

## 2017-12-12 NOTE — Telephone Encounter (Signed)
Ok for refill? 

## 2018-02-07 ENCOUNTER — Other Ambulatory Visit: Payer: Self-pay | Admitting: Women's Health

## 2018-02-07 DIAGNOSIS — F411 Generalized anxiety disorder: Secondary | ICD-10-CM

## 2018-02-07 NOTE — Telephone Encounter (Signed)
Okay to refill x1 

## 2018-02-07 NOTE — Telephone Encounter (Signed)
You are back up MD) last filled on 12/12/17 with 1 refill.

## 2018-02-07 NOTE — Telephone Encounter (Signed)
Rx called in 

## 2018-04-11 ENCOUNTER — Other Ambulatory Visit: Payer: Self-pay | Admitting: Gynecology

## 2018-04-11 DIAGNOSIS — F411 Generalized anxiety disorder: Secondary | ICD-10-CM

## 2018-04-12 NOTE — Telephone Encounter (Signed)
Ok for refill? 

## 2018-04-14 NOTE — Telephone Encounter (Signed)
Called into pharmacy

## 2018-06-24 ENCOUNTER — Other Ambulatory Visit: Payer: Self-pay | Admitting: Women's Health

## 2018-06-24 DIAGNOSIS — F411 Generalized anxiety disorder: Secondary | ICD-10-CM

## 2018-06-24 NOTE — Telephone Encounter (Signed)
Rx called in 

## 2018-06-24 NOTE — Telephone Encounter (Signed)
Spoke with patient and advised best not to take this medication daily per [place].

## 2018-06-24 NOTE — Telephone Encounter (Signed)
Okay for refill, please call and review best not to take daily.

## 2018-07-29 ENCOUNTER — Other Ambulatory Visit: Payer: Self-pay | Admitting: Women's Health

## 2018-07-29 DIAGNOSIS — Z1231 Encounter for screening mammogram for malignant neoplasm of breast: Secondary | ICD-10-CM

## 2018-08-27 ENCOUNTER — Ambulatory Visit: Payer: BC Managed Care – PPO

## 2018-08-27 ENCOUNTER — Ambulatory Visit
Admission: RE | Admit: 2018-08-27 | Discharge: 2018-08-27 | Disposition: A | Discharge status: Home / Self Care | Payer: BC Managed Care – PPO | Source: Ambulatory Visit | Attending: Women's Health | Admitting: Women's Health

## 2018-08-27 DIAGNOSIS — Z1231 Encounter for screening mammogram for malignant neoplasm of breast: Secondary | ICD-10-CM

## 2018-09-01 ENCOUNTER — Other Ambulatory Visit: Payer: Self-pay | Admitting: Women's Health

## 2018-09-01 DIAGNOSIS — R928 Other abnormal and inconclusive findings on diagnostic imaging of breast: Secondary | ICD-10-CM

## 2018-09-02 ENCOUNTER — Ambulatory Visit
Admission: RE | Admit: 2018-09-02 | Discharge: 2018-09-02 | Disposition: A | Discharge status: Home / Self Care | Payer: BC Managed Care – PPO | Source: Ambulatory Visit | Attending: Women's Health | Admitting: Women's Health

## 2018-09-02 DIAGNOSIS — R928 Other abnormal and inconclusive findings on diagnostic imaging of breast: Secondary | ICD-10-CM

## 2018-09-10 ENCOUNTER — Encounter: Payer: Self-pay | Admitting: Women's Health

## 2018-09-10 ENCOUNTER — Ambulatory Visit: Payer: BC Managed Care – PPO | Admitting: Women's Health

## 2018-09-10 VITALS — BP 120/82 | Ht 64.0 in | Wt 186.0 lb

## 2018-09-10 DIAGNOSIS — Z13220 Encounter for screening for lipoid disorders: Secondary | ICD-10-CM

## 2018-09-10 DIAGNOSIS — Z01419 Encounter for gynecological examination (general) (routine) without abnormal findings: Secondary | ICD-10-CM

## 2018-09-10 DIAGNOSIS — F411 Generalized anxiety disorder: Secondary | ICD-10-CM

## 2018-09-10 LAB — COMPREHENSIVE METABOLIC PANEL
AG Ratio: 2 (calc) (ref 1.0–2.5)
ALT: 23 U/L (ref 6–29)
AST: 15 U/L (ref 10–35)
Albumin: 4.3 g/dL (ref 3.6–5.1)
Alkaline phosphatase (APISO): 61 U/L (ref 37–153)
BUN: 15 mg/dL (ref 7–25)
CO2: 26 mmol/L (ref 20–32)
CREATININE: 0.68 mg/dL (ref 0.50–1.05)
Calcium: 9.4 mg/dL (ref 8.6–10.4)
Chloride: 105 mmol/L (ref 98–110)
Globulin: 2.1 g/dL (calc) (ref 1.9–3.7)
Glucose, Bld: 78 mg/dL (ref 65–99)
Potassium: 3.7 mmol/L (ref 3.5–5.3)
Sodium: 139 mmol/L (ref 135–146)
Total Bilirubin: 0.8 mg/dL (ref 0.2–1.2)
Total Protein: 6.4 g/dL (ref 6.1–8.1)

## 2018-09-10 LAB — CBC WITH DIFFERENTIAL/PLATELET
Absolute Monocytes: 722 cells/uL (ref 200–950)
Basophils Absolute: 86 cells/uL (ref 0–200)
Basophils Relative: 0.9 %
Eosinophils Absolute: 133 cells/uL (ref 15–500)
Eosinophils Relative: 1.4 %
HCT: 41.6 % (ref 35.0–45.0)
Hemoglobin: 14.7 g/dL (ref 11.7–15.5)
Lymphs Abs: 2546 cells/uL (ref 850–3900)
MCH: 33.6 pg — ABNORMAL HIGH (ref 27.0–33.0)
MCHC: 35.3 g/dL (ref 32.0–36.0)
MCV: 95.2 fL (ref 80.0–100.0)
MONOS PCT: 7.6 %
MPV: 11.8 fL (ref 7.5–12.5)
Neutro Abs: 6014 cells/uL (ref 1500–7800)
Neutrophils Relative %: 63.3 %
PLATELETS: 247 10*3/uL (ref 140–400)
RBC: 4.37 10*6/uL (ref 3.80–5.10)
RDW: 12.1 % (ref 11.0–15.0)
TOTAL LYMPHOCYTE: 26.8 %
WBC: 9.5 10*3/uL (ref 3.8–10.8)

## 2018-09-10 LAB — LIPID PANEL
Cholesterol: 174 mg/dL (ref <200)
HDL: 48 mg/dL — ABNORMAL LOW (ref >=50)
LDL CHOLESTEROL (CALC): 103 mg/dL — AB
Non-HDL Cholesterol (Calc): 126 mg/dL (calc) (ref <130)
Total CHOL/HDL Ratio: 3.6 (calc) (ref <5.0)
Triglycerides: 130 mg/dL (ref <150)

## 2018-09-10 MED ORDER — ALPRAZOLAM 0.5 MG PO TABS: 0.5000 mg | ORAL_TABLET | Freq: Every day | ORAL | 1 refills | Status: DC

## 2018-09-10 NOTE — Progress Notes (Signed)
[place] Sep 24, 1970 [id number]    History:    Presents for annual exam.  02/2016 Mirena IUD amenorrhea.  2015, ASCUS with negative colposcopy and biopsy 2018, 2019, ASCUS with negative HR HPV.  Normal mammogram after ultrasound 2020.  Migraines decreasing in frequency.  Past medical history, past surgical history, family history and social history were all reviewed and documented in the EPIC chart.  Remarried several years, son 48, daughter 19 both doing well some difficulty with relationship with their father (age 15)   Daughter 65 doing well.  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  Father currently in long-term care with emphysema for rehab.  ROS:  A ROS was performed and pertinent positives and negatives are included.  Exam:  Vitals:   09/10/18 1404  BP: 120/82  Weight: 186 lb (84.4 kg)  Height: 5\' 4"  (1.626 m)   Body mass index is 31.93 kg/m.   General appearance:  Normal Thyroid:  Symmetrical, normal in size, without palpable masses or nodularity. Respiratory  Auscultation:  Clear without wheezing or rhonchi Cardiovascular  Auscultation:  Regular rate, without rubs, murmurs or gallops  Edema/varicosities:  Not grossly evident Abdominal  Soft,nontender, without masses, guarding or rebound.  Liver/spleen:  No organomegaly noted  Hernia:  None appreciated  Skin  Inspection:  Grossly normal   Breasts: Examined lying and sitting.     Right: Without masses, retractions, discharge or axillary adenopathy.     Left: Without masses, retractions, discharge or axillary adenopathy. Gentitourinary   Inguinal/mons:  Normal without inguinal adenopathy  External genitalia:  Normal  BUS/Urethra/Skene's glands:  Normal  Vagina:  Normal  Cervix:  Normal IUD strings visible  Uterus:  normal in size, shape and contour.  Midline and mobile  Adnexa/parametria:     Rt: Without masses or tenderness.   Lt: Without masses or tenderness.  Anus and  perineum: Normal  Digital rectal exam: Normal sphincter tone without palpated masses or tenderness  Assessment/Plan:  48 y.o. MWF G2 P2 +1 stepdaughter for annual exam with no complaints.  02/2016 Mirena IUD amenorrhea History of persistent ASCUS with negative colposcopy and biopsy negative HR HPV Obesity  Plan: Aware Mirena IUD is good for 5 years.  SBEs, continue annual screening mammogram, calcium rich foods, vitamin D 2000 daily encouraged.  Leisure activities, self-care encouraged.  Situational stress of father in long-term care.  Xanax 0.5 prescription, proper use aware to use sparingly, addictive properties reviewed.  Encouraged increase exercise, decrease calorie/carbs.  CBC, CMP, lipid panel Pap ASCUS with negative HR HPV 2019.      Huel Cote [facility], 2:09 PM 09/10/2018

## 2018-09-10 NOTE — Patient Instructions (Signed)

## 2018-11-10 ENCOUNTER — Other Ambulatory Visit: Payer: Self-pay | Admitting: Women's Health

## 2018-11-10 DIAGNOSIS — F411 Generalized anxiety disorder: Secondary | ICD-10-CM

## 2018-11-11 NOTE — Telephone Encounter (Signed)
Ok for refill? 

## 2018-11-11 NOTE — Telephone Encounter (Signed)
Rx called in to pharmacy. 

## 2018-12-15 ENCOUNTER — Encounter: Payer: Self-pay | Admitting: Women's Health

## 2018-12-15 ENCOUNTER — Other Ambulatory Visit: Payer: Self-pay | Admitting: Women's Health

## 2018-12-15 MED ORDER — MEGESTROL ACETATE 40 MG PO TABS: 40.0000 mg | ORAL_TABLET | Freq: Two times a day (BID) | ORAL | 0 refills | Status: DC

## 2019-01-09 ENCOUNTER — Encounter: Payer: Self-pay | Admitting: Women's Health

## 2019-02-11 ENCOUNTER — Other Ambulatory Visit: Payer: Self-pay | Admitting: Women's Health

## 2019-02-11 ENCOUNTER — Telehealth: Payer: Self-pay

## 2019-02-11 DIAGNOSIS — F411 Generalized anxiety disorder: Secondary | ICD-10-CM

## 2019-02-11 DIAGNOSIS — Z20822 Contact with and (suspected) exposure to covid-19: Secondary | ICD-10-CM

## 2019-02-11 NOTE — Telephone Encounter (Signed)
Rx called in 

## 2019-02-11 NOTE — Telephone Encounter (Signed)
Telephone call, reviewed Xanax is addictive and not  to use daily, tearful, 50 year old daughter is in behavioral health with depression.  Encouraged family counseling  Okay for refill

## 2019-02-11 NOTE — Telephone Encounter (Signed)
labs

## 2019-02-12 LAB — NOVEL CORONAVIRUS, NAA: SARS-CoV-2, NAA: NOT DETECTED

## 2019-04-01 ENCOUNTER — Encounter: Payer: Self-pay | Admitting: Gynecology

## 2019-05-15 ENCOUNTER — Other Ambulatory Visit: Payer: Self-pay | Admitting: Women's Health

## 2019-05-15 DIAGNOSIS — F411 Generalized anxiety disorder: Secondary | ICD-10-CM

## 2019-05-15 NOTE — Telephone Encounter (Signed)
Ok for refill? 

## 2019-05-19 NOTE — Telephone Encounter (Signed)
Rz called in. KW CMA

## 2019-08-04 ENCOUNTER — Other Ambulatory Visit: Payer: Self-pay | Admitting: Women's Health

## 2019-08-04 DIAGNOSIS — F411 Generalized anxiety disorder: Secondary | ICD-10-CM

## 2019-08-04 NOTE — Telephone Encounter (Signed)
Okay for refill, please call and review best not to take daily, addictive.  Try OTC melatonin

## 2019-08-05 NOTE — Telephone Encounter (Signed)
Rx called in to pharmacy. 

## 2019-08-21 ENCOUNTER — Other Ambulatory Visit: Payer: Self-pay | Admitting: Women's Health

## 2019-08-21 DIAGNOSIS — Z1231 Encounter for screening mammogram for malignant neoplasm of breast: Secondary | ICD-10-CM

## 2019-09-02 ENCOUNTER — Ambulatory Visit: Payer: BC Managed Care – PPO

## 2019-09-07 DIAGNOSIS — Z87898 Personal history of other specified conditions: Secondary | ICD-10-CM

## 2019-09-07 HISTORY — DX: Personal history of other specified conditions: Z87.898

## 2019-09-14 ENCOUNTER — Encounter: Payer: BC Managed Care – PPO | Admitting: Women's Health

## 2019-09-16 DIAGNOSIS — R4020 Unspecified coma: Secondary | ICD-10-CM

## 2019-09-16 HISTORY — DX: Unspecified coma: R40.20

## 2019-09-17 ENCOUNTER — Other Ambulatory Visit: Payer: Self-pay

## 2019-09-17 ENCOUNTER — Other Ambulatory Visit (HOSPITAL_COMMUNITY): Payer: Self-pay | Admitting: Internal Medicine

## 2019-09-17 ENCOUNTER — Ambulatory Visit (HOSPITAL_COMMUNITY)
Admission: RE | Admit: 2019-09-17 | Discharge: 2019-09-17 | Disposition: A | Discharge status: Home / Self Care | Payer: BC Managed Care – PPO | Source: Ambulatory Visit | Attending: Internal Medicine | Admitting: Internal Medicine

## 2019-09-17 DIAGNOSIS — I639 Cerebral infarction, unspecified: Secondary | ICD-10-CM | POA: Insufficient documentation

## 2019-09-17 DIAGNOSIS — G4452 New daily persistent headache (NDPH): Secondary | ICD-10-CM | POA: Diagnosis present

## 2019-09-22 ENCOUNTER — Ambulatory Visit: Payer: BC Managed Care – PPO | Admitting: Women's Health

## 2019-09-22 ENCOUNTER — Other Ambulatory Visit: Payer: Self-pay

## 2019-09-22 ENCOUNTER — Encounter: Payer: Self-pay | Admitting: Women's Health

## 2019-09-22 VITALS — BP 122/80 | Ht 64.0 in | Wt 177.0 lb

## 2019-09-22 DIAGNOSIS — Z975 Presence of (intrauterine) contraceptive device: Secondary | ICD-10-CM | POA: Diagnosis not present

## 2019-09-22 DIAGNOSIS — Z01419 Encounter for gynecological examination (general) (routine) without abnormal findings: Secondary | ICD-10-CM

## 2019-09-22 NOTE — Patient Instructions (Addendum)
It was good to see you today Vitamin D 2000 IUs daily [facility]  Dr Carlean Purl [phone]  Health Maintenance, Female Adopting a healthy lifestyle and getting preventive care are important in promoting health and wellness. Ask your health care provider about:  The right schedule for you to have regular tests and exams.  Things you can do on your own to prevent diseases and keep yourself healthy. What should I know about diet, weight, and exercise? Eat a healthy diet   Eat a diet that includes plenty of vegetables, fruits, low-fat dairy products, and lean protein.  Do not eat a lot of foods that are high in solid fats, added sugars, or sodium. Maintain a healthy weight Body mass index (BMI) is used to identify weight problems. It estimates body fat based on height and weight. Your health care provider can help determine your BMI and help you achieve or maintain a healthy weight. Get regular exercise Get regular exercise. This is one of the most important things you can do for your health. Most adults should:  Exercise for at least 150 minutes each week. The exercise should increase your heart rate and make you sweat (moderate-intensity exercise).  Do strengthening exercises at least twice a week. This is in addition to the moderate-intensity exercise.  Spend less time sitting. Even light physical activity can be beneficial. Watch cholesterol and blood lipids Have your blood tested for lipids and cholesterol at 51 years of age, then have this test every 5 years. Have your cholesterol levels checked more often if:  Your lipid or cholesterol levels are high.  You are older than 51 years of age.  You are at high risk for heart disease. What should I know about cancer screening? Depending on your health history and family history, you may need to have cancer screening at various ages. This may include screening for:  Breast cancer.  Cervical cancer.  Colorectal cancer.  Skin  cancer.  Lung cancer. What should I know about heart disease, diabetes, and high blood pressure? Blood pressure and heart disease  High blood pressure causes heart disease and increases the risk of stroke. This is more likely to develop in people who have high blood pressure readings, are of African descent, or are overweight.  Have your blood pressure checked: ? Every 3-5 years if you are 51-73 years of age. ? Every year if you are 51 years old or older. Diabetes Have regular diabetes screenings. This checks your fasting blood sugar level. Have the screening done:  Once every three years after age 51 if you are at a normal weight and have a low risk for diabetes.  More often and at a younger age if you are overweight or have a high risk for diabetes. What should I know about preventing infection? Hepatitis B If you have a higher risk for hepatitis B, you should be screened for this virus. Talk with your health care provider to find out if you are at risk for hepatitis B infection. Hepatitis C Testing is recommended for:  Everyone born from [birth year] through 1967.  Anyone with known risk factors for hepatitis C. Sexually transmitted infections (STIs)  Get screened for STIs, including gonorrhea and chlamydia, if: ? You are sexually active and are younger than 51 years of age. ? You are older than 51 years of age and your health care provider tells you that you are at risk for this type of infection. ? Your sexual activity has changed since you  were last screened, and you are at increased risk for chlamydia or gonorrhea. Ask your health care provider if you are at risk.  Ask your health care provider about whether you are at high risk for HIV. Your health care provider may recommend a prescription medicine to help prevent HIV infection. If you choose to take medicine to prevent HIV, you should first get tested for HIV. You should then be tested every 3 months for as long as you are taking  the medicine. Pregnancy  If you are about to stop having your period (premenopausal) and you may become pregnant, seek counseling before you get pregnant.  Take 400 to 800 micrograms (mcg) of folic acid every day if you become pregnant.  Ask for birth control (contraception) if you want to prevent pregnancy. Osteoporosis and menopause Osteoporosis is a disease in which the bones lose minerals and strength with aging. This can result in bone fractures. If you are 18 years old or older, or if you are at risk for osteoporosis and fractures, ask your health care provider if you should:  Be screened for bone loss.  Take a calcium or vitamin D supplement to lower your risk of fractures.  Be given hormone replacement therapy (HRT) to treat symptoms of menopause. Follow these instructions at home: Lifestyle  Do not use any products that contain nicotine or tobacco, such as cigarettes, e-cigarettes, and chewing tobacco. If you need help quitting, ask your health care provider.  Do not use street drugs.  Do not share needles.  Ask your health care provider for help if you need support or information about quitting drugs. Alcohol use  Do not drink alcohol if: ? Your health care provider tells you not to drink. ? You are pregnant, may be pregnant, or are planning to become pregnant.  If you drink alcohol: ? Limit how much you use to 0-1 drink a day. ? Limit intake if you are breastfeeding.  Be aware of how much alcohol is in your drink. In the [place] one drink equals one 12 oz bottle of beer (355 mL), one 5 oz glass of wine (148 mL), or one 1 oz glass of hard liquor (44 mL). General instructions  Schedule regular health, dental, and eye exams.  Stay current with your vaccines.  Tell your health care provider if: ? You often feel depressed. ? You have ever been abused or do not feel safe at home. Summary  Adopting a healthy lifestyle and getting preventive care are important in  promoting health and wellness.  Follow your health care provider's instructions about healthy diet, exercising, and getting tested or screened for diseases.  Follow your health care provider's instructions on monitoring your cholesterol and blood pressure. This information is not intended to replace advice given to you by your health care provider. Make sure you discuss any questions you have with your health care provider. Document Revised: 06/18/2018 Document Reviewed: 06/18/2018 Elsevier Patient Education  2020 [employer].

## 2019-09-22 NOTE — Progress Notes (Signed)
[place] Aug 30, 1970 [id number]    History:    Presents for annual exam.  02/2016 Mirena with irregular spotting spotting.  2015 ASCUS with positive high-risk HPV negative C&B.  Paps normal after, 2019 Pap ASCUS with negative high-risk HPV.  Normal mammogram history, 08/2018 mammogram normal after ultrasound mammograms scheduled this year.  Has had the Covid vaccine.  Past medical history, past surgical history, family history and social history were all reviewed and documented in the EPIC chart.  Works at [employer].  Pamela Martinez 12 having anxiety and depression (strained relationship with father) , in counseling.  Lee 15 junior in high school doing well.  ROS:  A ROS was performed and pertinent positives and negatives are included.  Exam:  Vitals:   09/22/19 1359  BP: 122/80  Weight: 177 lb (80.3 kg)  Height: 5\' 4"  (1.626 m)   Body mass index is 30.38 kg/m.   General appearance:  Normal Thyroid:  Symmetrical, normal in size, without palpable masses or nodularity. Respiratory  Auscultation:  Clear without wheezing or rhonchi Cardiovascular  Auscultation:  Regular rate, without rubs, murmurs or gallops  Edema/varicosities:  Not grossly evident Abdominal  Soft,nontender, without masses, guarding or rebound.  Liver/spleen:  No organomegaly noted  Hernia:  None appreciated  Skin  Inspection:  Grossly normal   Breasts: Examined lying and sitting.     Right: Without masses, retractions, discharge or axillary adenopathy.     Left: Without masses, retractions, discharge or axillary adenopathy. Gentitourinary   Inguinal/mons:  Normal without inguinal adenopathy  External genitalia:  Normal  BUS/Urethra/Skene's glands:  Normal  Vagina:  Normal  Cervix:  Normal IUD strings visible, scant menses  Uterus:  normal in size, shape and contour.  Midline and mobile  Adnexa/parametria:     Rt: Without masses or tenderness.   Lt: Without masses or tenderness.  Anus and  perineum: Normal  Digital rectal exam: Normal sphincter tone without palpated masses or tenderness  Assessment/Plan:  49 y.o. MWF G2, P2 for annual exam with no complaints of vaginal discharge, urinary symptoms or abdominal pain  02/2016 Mirena IUD irregular spotting 2015 ASCUS with positive high risk HPV with negative colposcopy/biopsy normal Paps after Labs-primary care  Plan: Aware IUD is good for 5 years, minimal menopausal symptoms.  SBEs, keep scheduled mammogram appointment, calcium rich foods, vitamin D 2000 IUs daily, continue healthy lifestyle of decreasing calorie/carbs and increasing exercise-has lost 12 pounds.  Self-care, leisure activities encouraged..  Screening colonoscopy reviewed and encouraged [facility] information given instructed to schedule.  2019 Pap ASCUS with negative high-risk HPV, Pap.      [place], 2:04 PM 09/22/2019

## 2019-09-24 LAB — PAP IG W/ RFLX HPV ASCU

## 2019-09-28 ENCOUNTER — Other Ambulatory Visit: Payer: Self-pay | Admitting: *Deleted

## 2019-09-28 ENCOUNTER — Encounter: Payer: Self-pay | Admitting: *Deleted

## 2019-09-30 ENCOUNTER — Encounter: Payer: Self-pay | Admitting: Diagnostic Neuroimaging

## 2019-09-30 ENCOUNTER — Other Ambulatory Visit: Payer: Self-pay

## 2019-09-30 ENCOUNTER — Ambulatory Visit: Payer: BC Managed Care – PPO | Admitting: Diagnostic Neuroimaging

## 2019-09-30 VITALS — BP 115/75 | HR 67 | Ht 64.0 in | Wt 176.4 lb

## 2019-09-30 DIAGNOSIS — R4020 Unspecified coma: Secondary | ICD-10-CM | POA: Diagnosis not present

## 2019-09-30 NOTE — Patient Instructions (Addendum)
-   check MRI and EEG  - According to [place] law, you can not drive unless you are seizure / syncope free for at least 6 months and under physician's care.  - Please maintain precautions. Do not participate in activities where a loss of awareness could harm you or someone else. No swimming alone, no tub bathing, no hot tubs, no driving, no operating motorized vehicles (cars, ATVs, motocycles, etc), lawnmowers, power tools or firearms. No standing at heights, such as rooftops, ladders or stairs. Avoid hot objects such as stoves, heaters, open fires. Wear a helmet when riding a bicycle, scooter, skateboard, etc. and avoid areas of traffic. Set your water heater to 120 degrees or less.

## 2019-09-30 NOTE — Progress Notes (Signed)
[facility]  PATIENT: Pamela Martinez DOB: 1971-03-06  REFERRING CLINICIAN: Celene Squibb, MD HISTORY FROM: patient  REASON FOR VISIT: new consult    HISTORICAL  CHIEF COMPLAINT:  Chief Complaint  Patient presents with  . Possible CVA    rm 7 New Pt  "09/16/19 LOC x 2 in night, doesn't remember falling but heard noises, loss of bladder control; CT head NL"     HISTORY OF PRESENT ILLNESS:   49 year old female here for evaluation of recurrent syncope.  When patient was younger patient has had intermittent episodes of passing out, in the setting of giving blood, knee arthrocentesis, cutting her finger, going to the hospital.  Last episode like this occurred 5 years ago.  09/16/2019 patient woke up in the middle the night to get a glass of water.  She felt somewhat funny, and ended up falling to the ground.  She felt as though the floor came up to meet her.  She ended up crashing into some items and making some noise.  Her husband came to her aid.  He tried to help her stand up but she did not feel well.  She was sitting up against the wall.  And her eyes rolled back and she collapsed.  She was able to wake up briefly and then passed out a third time time.  This time she had urinary incontinence.  No convulsions.  No tongue biting.  After she woke up she felt hot and sweaty.  She was able to stand up and go back to bed.  Loss of consciousness with each of these events was brief, less than 1 minute.  Since that time no recurrent or further events.  No prodromal change in diet, exercise, sleep or hydration.  She has been under more stress lately.  REVIEW OF SYSTEMS: Full 14 system review of systems performed and negative with exception of: As per HPI.  ALLERGIES: Allergies  Allergen Reactions  . Diflucan [Fluconazole] Dermatitis    HOME MEDICATIONS: Outpatient Medications Prior to Visit  Medication Sig Dispense Refill  . Cholecalciferol (D3-50 PO) Take by  mouth.    . clonazePAM (KLONOPIN) 0.5 MG tablet SMARTSIG:1 Tablet(s) By Mouth As Needed    . MAGNESIUM PO Take 1 tablet by mouth daily.    . montelukast (SINGULAIR) 10 MG tablet Take 10 mg by mouth every evening.  0  . Multiple Vitamins-Minerals (MULTIVITAMIN WITH MINERALS) tablet Take 1 tablet by mouth daily.    . sertraline (ZOLOFT) 50 MG tablet Take 50 mg by mouth daily.    . verapamil (CALAN) 120 MG tablet Take 120 mg by mouth at bedtime.    . fluticasone (FLONASE) 50 MCG/ACT nasal spray Place 2 sprays into both nostrils daily.    Marland Kitchen ALPRAZolam (XANAX) 0.5 MG tablet TAKE 1 TABLET BY MOUTH EVERY DAY AT BEDTIME AS NEEDED 30 tablet 0  . megestrol (MEGACE) 40 MG tablet Take 1 tablet (40 mg total) by mouth 2 (two) times daily. 20 tablet 0   Facility-Administered Medications Prior to Visit  Medication Dose Route Frequency Provider Last Rate Last Admin  . levonorgestrel (MIRENA) 20 MCG/24HR IUD   Intrauterine Once Fontaine, Belinda Block, MD        PAST MEDICAL HISTORY: Past Medical History:  Diagnosis Date  . Allergy   . Anxiety   . 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  Loss of consciousness ([place]) 09/16/2019   x 2  . Migraine     PAST SURGICAL HISTORY: Past Surgical History:  Procedure Laterality Date  . INTRAUTERINE DEVICE INSERTION  02/10/2016   Mirena  . KNEE SURGERY     X 4 = 1X RIGHT 3X LEFT/3 ARTHROSCOPIES AND 1 ACL  . KNEE SURGERY     ACL    FAMILY HISTORY: Family History  Problem Relation Age of Onset  . Cancer Father        THROAT  . Hyperlipidemia Father   . COPD Father   . Hypertension Paternal Grandfather   . Cancer Paternal Grandfather        COLON  . Hyperlipidemia Brother   . Hypertension Brother   . Stroke Maternal Grandmother   . Stroke Paternal Grandmother   . Hyperlipidemia Brother   . Cancer Paternal Aunt        PRIMARY PERITONEAL CANCER     SOCIAL HISTORY: Social History   Socioeconomic History  . Marital status: Married    Spouse name: Octavia Bruckner  . Number of children: 2  . Years of education: Not on file  . Highest education level: Associate degree: academic program  Occupational History    Comment: working on [employer]  Tobacco Use  . Smoking status: Former Smoker    Types: Cigarettes    Quit date: 07/09/1988    Years since quitting: 31.2  . Smokeless tobacco: Never Used  Substance and Sexual Activity  . Alcohol use: Yes    Comment: weekly  . Drug use: No  . Sexual activity: Yes    Birth control/protection: I.U.D.    Comment: Mirena 02/10/2016  Other Topics Concern  . Not on file  Social History Narrative   Lives with husband    caffeine, tea x 1, soda x 1   Social Determinants of Health   Financial Resource Strain:   . Difficulty of Paying Living Expenses:   Food Insecurity:   . Worried About Charity fundraiser in the Last Year:   . Arboriculturist in the Last Year:   Transportation Needs:   . Film/video editor (Medical):   Marland Kitchen Lack of Transportation (Non-Medical):   Physical Activity:   . Days of Exercise per Week:   . Minutes of Exercise per Session:   Stress:   . Feeling of Stress :   Social Connections:   . Frequency of Communication with Friends and Family:   . Frequency of Social Gatherings with Friends and Family:   . Attends Religious Services:   . Active Member of Clubs or Organizations:   . Attends Archivist Meetings:   Marland Kitchen Marital Status:   Intimate Partner Violence:   . Fear of Current or Ex-Partner:   . Emotionally Abused:   Marland Kitchen Physically Abused:   . Sexually Abused:      PHYSICAL EXAM  GENERAL EXAM/CONSTITUTIONAL: Vitals:  Vitals:   09/30/19 0920  BP: 115/75  Pulse: 67  Weight: 176 lb 6.4 oz (80 kg)  Height: 5\' 4"  (1.626 m)     Body mass index is 30.28 kg/m. Wt Readings from Last 3 Encounters:  09/30/19 176 lb 6.4 oz (80 kg)  09/22/19 177 lb (80.3 kg)   09/10/18 186 lb (84.4 kg)     Patient is in no distress; well developed, nourished and groomed; neck is supple  CARDIOVASCULAR:  Examination of carotid arteries is normal; no carotid bruits  Regular rate and rhythm, no murmurs  Examination of peripheral vascular system by observation and palpation is normal  EYES:  Ophthalmoscopic exam of optic discs and posterior segments is normal; no papilledema or hemorrhages  No exam data present  MUSCULOSKELETAL:  Gait, strength, tone, movements noted in Neurologic exam below  NEUROLOGIC: MENTAL STATUS:  No flowsheet data found.  awake, alert, oriented to person, place and time  recent and remote memory intact  normal attention and concentration  language fluent, comprehension intact, naming intact  fund of knowledge appropriate  CRANIAL NERVE:   2nd - no papilledema on fundoscopic exam  2nd, 3rd, 4th, 6th - pupils equal and reactive to light, visual fields full to confrontation, extraocular muscles intact, no nystagmus  5th - facial sensation symmetric  7th - facial strength symmetric  8th - hearing intact  9th - palate elevates symmetrically, uvula midline  11th - shoulder shrug symmetric  12th - tongue protrusion midline  MOTOR:   normal bulk and tone, full strength in the BUE, BLE  SENSORY:   normal and symmetric to light touch, temperature, vibration  COORDINATION:   finger-nose-finger, fine finger movements normal  REFLEXES:   deep tendon reflexes present and symmetric  GAIT/STATION:   narrow based gait     DIAGNOSTIC DATA (LABS, IMAGING, TESTING) - I reviewed patient records, labs, notes, testing and imaging myself where available.  Lab Results  Component Value Date   WBC 9.5 09/10/2018   HGB 14.7 09/10/2018   HCT 41.6 09/10/2018   MCV 95.2 09/10/2018   PLT 247 09/10/2018      Component Value Date/Time   NA 139 09/10/2018 1456   K 3.7 09/10/2018 1456   CL 105 09/10/2018 1456    CO2 26 09/10/2018 1456   GLUCOSE 78 09/10/2018 1456   BUN 15 09/10/2018 1456   CREATININE 0.68 09/10/2018 1456   CALCIUM 9.4 09/10/2018 1456   PROT 6.4 09/10/2018 1456   ALBUMIN 4.2 08/24/2016 1527   AST 15 09/10/2018 1456   ALT 23 09/10/2018 1456   ALKPHOS 63 08/24/2016 1527   BILITOT 0.8 09/10/2018 1456   GFRNONAA 83 (L) 04/14/2014 1226   GFRAA >90 04/14/2014 1226   Lab Results  Component Value Date   CHOL 174 09/10/2018   HDL 48 (L) 09/10/2018   LDLCALC 103 (H) 09/10/2018   TRIG 130 09/10/2018   CHOLHDL 3.6 09/10/2018   No results found for: HGBA1C No results found for: VITAMINB12 No results found for: TSH   09/17/19 CT head [I reviewed images myself and agree with interpretation. -VRP]  - Study within normal limits.   ASSESSMENT AND PLAN  49 y.o. year old female here with history of provoked vasovagal syncope when she was younger, now with new onset of unprovoked syncope on 09/16/2019.  We will proceed with further work-up to rule out other neurologic or seizure etiologies.  Dx:  1. Loss of consciousness ([place])      PLAN:   - check MRI and EEG  - consider cardiac / medical workup of recurrent syncope  - According to [place] law, you can not drive unless you are seizure / syncope free for at least 6 months and under physician's care.  - Please maintain precautions. Do not participate in activities where a loss of awareness could harm you or someone else. No swimming alone, no tub bathing, no hot tubs, no driving, no operating motorized vehicles (cars, ATVs, motocycles, etc), lawnmowers, power tools or firearms. No standing at heights, such as rooftops, ladders or stairs. Avoid  hot objects such as stoves, heaters, open fires. Wear a helmet when riding a bicycle, scooter, skateboard, etc. and avoid areas of traffic. Set your water heater to 120 degrees or less.  Orders Placed This Encounter  Procedures  . MR BRAIN W WO CONTRAST  . EEG adult   Return in about 6  months (around 04/01/2020), or pending test results, for pending if symptoms worsen or fail to improve.  I reviewed images, labs, notes, records myself. I summarized findings and reviewed with patient, for this high risk condition (recurrent syncope) requiring high complexity decision making.    Penni Bombard, MD [id number], [id number] AM Certified in Neurology, Neurophysiology and Neuroimaging  [facility] [address] [place], [place] [address] [phone]

## 2019-10-05 ENCOUNTER — Telehealth: Payer: Self-pay | Admitting: Diagnostic Neuroimaging

## 2019-10-05 NOTE — Telephone Encounter (Signed)
LVM for pt to call back about scheduling mri  BCBS [id number] (exp. 10/05/19 to 04/01/20)

## 2019-10-05 NOTE — Telephone Encounter (Signed)
Spoke to the patient she is scheduled at [facility] for 10/13/19.

## 2019-10-05 NOTE — Telephone Encounter (Signed)
Pt returned the call. Please call back when available.

## 2019-10-08 DIAGNOSIS — D0512 Intraductal carcinoma in situ of left breast: Secondary | ICD-10-CM

## 2019-10-08 HISTORY — DX: Intraductal carcinoma in situ of left breast: D05.12

## 2019-10-12 ENCOUNTER — Ambulatory Visit
Admission: RE | Admit: 2019-10-12 | Discharge: 2019-10-12 | Disposition: A | Discharge status: Home / Self Care | Payer: BC Managed Care – PPO | Source: Ambulatory Visit | Attending: Women's Health | Admitting: Women's Health

## 2019-10-12 ENCOUNTER — Other Ambulatory Visit: Payer: Self-pay

## 2019-10-12 DIAGNOSIS — Z1231 Encounter for screening mammogram for malignant neoplasm of breast: Secondary | ICD-10-CM

## 2019-10-13 ENCOUNTER — Ambulatory Visit: Payer: BC Managed Care – PPO

## 2019-10-13 ENCOUNTER — Other Ambulatory Visit: Payer: Self-pay | Admitting: Women's Health

## 2019-10-13 ENCOUNTER — Other Ambulatory Visit: Payer: Self-pay

## 2019-10-13 DIAGNOSIS — R4020 Unspecified coma: Secondary | ICD-10-CM | POA: Diagnosis not present

## 2019-10-13 DIAGNOSIS — R928 Other abnormal and inconclusive findings on diagnostic imaging of breast: Secondary | ICD-10-CM

## 2019-10-13 MED ORDER — GADOBENATE DIMEGLUMINE 529 MG/ML IV SOLN
15.0000 mL | Freq: Once | INTRAVENOUS | Status: AC | PRN
  Administered 2019-10-13: 15 mL via INTRAVENOUS

## 2019-10-15 ENCOUNTER — Telehealth: Payer: Self-pay | Admitting: *Deleted

## 2019-10-15 NOTE — Telephone Encounter (Signed)
Spoke with patient and informed her that her MRI brain result is normal. She'll get a call with future EEG results when available. Patient verbalized understanding, appreciation.

## 2019-10-21 ENCOUNTER — Other Ambulatory Visit: Payer: Self-pay

## 2019-10-21 ENCOUNTER — Ambulatory Visit: Payer: BC Managed Care – PPO | Admitting: Diagnostic Neuroimaging

## 2019-10-21 DIAGNOSIS — R4020 Unspecified coma: Secondary | ICD-10-CM | POA: Diagnosis not present

## 2019-10-27 ENCOUNTER — Other Ambulatory Visit: Payer: Self-pay

## 2019-10-27 ENCOUNTER — Other Ambulatory Visit: Payer: Self-pay | Admitting: Women's Health

## 2019-10-27 ENCOUNTER — Ambulatory Visit
Admission: RE | Admit: 2019-10-27 | Discharge: 2019-10-27 | Disposition: A | Discharge status: Home / Self Care | Payer: BC Managed Care – PPO | Source: Ambulatory Visit | Attending: Women's Health | Admitting: Women's Health

## 2019-10-27 DIAGNOSIS — R928 Other abnormal and inconclusive findings on diagnostic imaging of breast: Secondary | ICD-10-CM

## 2019-10-28 ENCOUNTER — Telehealth: Payer: Self-pay | Admitting: *Deleted

## 2019-10-28 NOTE — Telephone Encounter (Signed)
Patient sent my chart asking for EEG results, test done 10/21/19. Routed to Dr Leta Baptist.

## 2019-10-28 NOTE — Telephone Encounter (Signed)
EEG is normal .- VRP 

## 2019-10-28 NOTE — Telephone Encounter (Signed)
I sent my chart message advising on normal EEG report.

## 2019-10-29 NOTE — Procedures (Signed)
   [facility]  EEG (ELECTROENCEPHALOGRAM) REPORT   STUDY DATE: 10/21/19 PATIENT NAME: Pamela Martinez DOB: 04-23-1971 MRN: [id number]  ORDERING CLINICIAN: Andrey Spearman, MD   TECHNOLOGIST: Babs Bertin  TECHNIQUE: Electroencephalogram was recorded utilizing standard 10-20 system of lead placement and reformatted into average and bipolar montages.  RECORDING TIME: 27 minutes ACTIVATION: hyperventilation and photic stimulation  CLINICAL INFORMATION: 49 year old female with recurrent loss of consciousness.  FINDINGS: Posterior dominant background rhythms, which attenuate with eye opening, ranging 10-11 hertz and 20-30 microvolts. No focal, lateralizing, epileptiform activity or seizures are seen. Patient recorded in the awake and drowsy state. EKG channel shows regular rhythm of 50-60 beats per minute.   IMPRESSION:   Normal EEG in the awake and drowsy states.    INTERPRETING PHYSICIAN:  Penni Bombard, MD Certified in Neurology, Neurophysiology and Neuroimaging  [facility] [address], [place], [place] [address] [phone]

## 2019-11-04 ENCOUNTER — Other Ambulatory Visit: Payer: Self-pay

## 2019-11-04 ENCOUNTER — Ambulatory Visit
Admission: RE | Admit: 2019-11-04 | Discharge: 2019-11-04 | Disposition: A | Discharge status: Home / Self Care | Payer: BC Managed Care – PPO | Source: Ambulatory Visit | Attending: Women's Health | Admitting: Women's Health

## 2019-11-04 DIAGNOSIS — R928 Other abnormal and inconclusive findings on diagnostic imaging of breast: Secondary | ICD-10-CM

## 2019-11-16 ENCOUNTER — Other Ambulatory Visit: Payer: Self-pay | Admitting: General Surgery

## 2019-11-16 DIAGNOSIS — D0512 Intraductal carcinoma in situ of left breast: Secondary | ICD-10-CM

## 2019-11-17 ENCOUNTER — Encounter: Payer: Self-pay | Admitting: Cardiology

## 2019-11-17 NOTE — Progress Notes (Signed)
Cardiology Office Note  Date: 11/18/2019   ID: Pamela Martinez, DOB [birth year], MRN [id number]  PCP:  Celene Squibb, MD  Cardiologist:  Rozann Lesches, MD Electrophysiologist:  None   Chief Complaint  Patient presents with  . History of syncope    History of Present Illness: Pamela Martinez is a 49 y.o. female referred for cardiology consultation by Ms. Kari Baars NP for the evaluation of syncope.  We discussed her symptoms.  She states that she had an initial episode back in mid March.  She had gotten up in the middle of the night to walk into the kitchen, became aware that she was falling, heard things crashing around her, her husband came in to check on her.  She does not recall feeling any sense of palpitations at that time.  She had another similar episode weeks later, again in the middle of the night when she had gotten up, but when she started to feel fuzzy and lightheaded she sat down and the symptoms went away.  Just recently she had another feeling of fuzziness and lightheadedness when she was using the bathroom seated.  She did not have syncope.  She recalls having episodes of syncope since childhood that sound very consistent with neurocardiogenic syncope based on description.  She had neurology consultation with Dr. Leta Baptist in March.  Subsequent brain MRI and EEG were both normal.  Orthostatic vital signs were normal today.  She has not had any major medication adjustments, Zoloft was started after these symptoms noted above.  She has been diagnosed recently with left-sided breast cancer, DCIS, undergoing further work-up for possible lumpectomy (Dr. [name]).  I personally reviewed her ECG today which shows sinus bradycardia.  Past Medical History:  Diagnosis Date  . Anxiety   . 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 Migraine   . Restless leg syndrome      Past Surgical History:  Procedure Laterality Date  . INTRAUTERINE DEVICE INSERTION  02/10/2016   Mirena  . KNEE SURGERY     X 4 = 1X RIGHT 3X LEFT/3 ARTHROSCOPIES AND 1 ACL  . KNEE SURGERY     ACL  . WISDOM TOOTH EXTRACTION      Current Outpatient Medications  Medication Sig Dispense Refill  . cetirizine (ZYRTEC) 10 MG tablet Take 10 mg by mouth daily.    . cholecalciferol (VITAMIN D3) 25 MCG (1000 UNIT) tablet Take 1,000 Units by mouth daily.    Marland Kitchen MAGNESIUM PO Take 1 tablet by mouth daily.    . Multiple Vitamins-Minerals (MULTIVITAMIN WITH MINERALS) tablet Take 1 tablet by mouth daily.    . sertraline (ZOLOFT) 50 MG tablet Take 50 mg by mouth daily.    . verapamil (CALAN) 120 MG tablet Take 120 mg by mouth at bedtime.     Current Facility-Administered Medications  Medication Dose Route Frequency Provider Last Rate Last Admin  . levonorgestrel (MIRENA) 20 MCG/24HR IUD   Intrauterine Once Fontaine, Belinda Block, MD       Allergies:  Diflucan [fluconazole]   Social History: The patient  reports that she quit smoking about 31 years ago. Her smoking use included cigarettes. She has never used smokeless tobacco. She reports current alcohol use. She reports that she does not use drugs.   Family History: The patient's family history includes CAD in her father; COPD in her father; Cancer  in her paternal aunt and paternal grandfather; Hyperlipidemia in her brother and brother; Hypertension in her brother; Stroke in her maternal grandmother.   ROS:  Please see the history of present illness. Otherwise, complete review of systems is positive for none.  All other systems are reviewed and negative.   Physical Exam: VS:  BP 118/80   Pulse (!) 56   Ht 5' 4.5" (1.638 m)   Wt 174 lb (78.9 kg)   SpO2 99%   BMI 29.41 kg/m , BMI Body mass index is 29.41 kg/m.  Wt Readings from Last 3 Encounters:  11/18/19 174 lb (78.9 kg)  09/30/19 176 lb 6.4 oz (80 kg)  09/22/19 177 lb (80.3 kg)     General: Patient appears comfortable at rest. HEENT: Conjunctiva and lids normal, wearing a mask. Neck: Supple, no elevated JVP or carotid bruits, no thyromegaly. Lungs: Clear to auscultation, nonlabored breathing at rest. Cardiac: Regular rate and rhythm, no S3 or significant systolic murmur, no pericardial rub. Extremities: No pitting edema, distal pulses 2+. Skin: Warm and dry. Musculoskeletal: No kyphosis. Neuropsychiatric: Alert and oriented x3, affect grossly appropriate.  ECG:  An ECG dated 04/14/2014 was personally reviewed today and demonstrated:  Normal sinus rhythm.  Recent Labwork:  March 2021: Hemoglobin 14.4, platelets 256, BUN 16, creatinine 0.67, potassium 4.1, AST 16, ALT 18, cholesterol 178, triglycerides 86, HDL 57, LDL 105, TSH 1.22  Other Studies Reviewed Today:  No prior cardiac testing for review today.  Assessment and Plan:  History of syncope and near syncope as discussed above, very likely to be neurocardiogenic in etiology based on description.  ECG is normal showing sinus bradycardia.  She has had no sense of palpitations.  She was not orthostatic today on examination.  We discussed maintaining adequate hydration, avoiding obvious situations that might provoke symptoms and taking a seated or supine position when she starts to feel prodromal symptoms.  We also discussed the possibility of medical therapy (possibly midodrine or Florinef if conservative management is not effective, would probably not try beta-blocker given low resting heart rate and already on Calan SR, she is already on an SSRI which sometimes is beneficial), but will hold off initiating any new medications at this point.  We will obtain a 7-day cardiac monitor to exclude any unusual heart rate variability or arrhythmias, but not felt to be of high likelihood.  This should not present a barrier to her continuing work-up for treatment of breast cancer realizing that she will ultimately be in situations  that increase her vagal tone and the likelihood of neurocardiogenic events.  Medication Adjustments/Labs and Tests Ordered: Current medicines are reviewed at length with the patient today.  Concerns regarding medicines are outlined above.   Tests Ordered: Orders Placed This Encounter  Procedures  . LONG TERM MONITOR (3-14 DAYS)  . EKG 12-Lead    Medication Changes: No orders of the defined types were placed in this encounter.   Disposition:  Follow up test results.  Signed, Satira Sark, MD, [facility] 11/18/2019 9:33 AM    [facility] [place], [place], [place] [address] Phone: [phone]; Fax: [phone]

## 2019-11-18 ENCOUNTER — Telehealth: Payer: Self-pay | Admitting: Cardiology

## 2019-11-18 ENCOUNTER — Telehealth: Payer: Self-pay | Admitting: Hematology and Oncology

## 2019-11-18 ENCOUNTER — Ambulatory Visit: Payer: BC Managed Care – PPO | Admitting: Cardiology

## 2019-11-18 ENCOUNTER — Encounter: Payer: Self-pay | Admitting: Cardiology

## 2019-11-18 ENCOUNTER — Other Ambulatory Visit: Payer: Self-pay

## 2019-11-18 VITALS — BP 118/80 | HR 56 | Ht 64.5 in | Wt 174.0 lb

## 2019-11-18 DIAGNOSIS — R55 Syncope and collapse: Secondary | ICD-10-CM | POA: Diagnosis not present

## 2019-11-18 DIAGNOSIS — Z87898 Personal history of other specified conditions: Secondary | ICD-10-CM

## 2019-11-18 NOTE — Telephone Encounter (Signed)
Received a new pt referral from Dr. [name] at [facility] for a new dx of DCIS. Pamela Martinez has been cld and scheduled to see Dr. Lindi Adie on 5/13 at 4:15pm. Pt aware to arrive 15 minutes early.

## 2019-11-18 NOTE — Telephone Encounter (Signed)
Pre-cert Verification for the following procedure    7 Day ZIO AT dx: h/o syncope

## 2019-11-18 NOTE — Patient Instructions (Addendum)
Medication Instructions:   Your physician recommends that you continue on your current medications as directed. Please refer to the Current Medication list given to you today.  Labwork:  NONE  Testing/Procedures: Your physician has recommended that you wear an event monitor for 7 days. Event monitors are medical devices that record the heart's electrical activity. Doctors most often [place] these monitors to diagnose arrhythmias. Arrhythmias are problems with the speed or rhythm of the heartbeat. The monitor is a small, portable device. You can wear one while you do your normal daily activities. This is usually used to diagnose what is causing palpitations/syncope (passing out). [employer] will contact you about this monitor.  Follow-Up:  Your physician recommends that you schedule a follow-up appointment in: pending.  Any Other Special Instructions Will Be Listed Below (If Applicable).  If you need a refill on your cardiac medications before your next appointment, please call your pharmacy.

## 2019-11-18 NOTE — Progress Notes (Signed)
Pamela Martinez CONSULT NOTE  Patient Care Team: Celene Squibb, MD as PCP - General (Internal Medicine) Satira Sark, MD as PCP - Cardiology (Cardiology)  CHIEF COMPLAINTS/PURPOSE OF CONSULTATION:  Newly diagnosed breast cancer  HISTORY OF PRESENTING ILLNESS:  Pamela Martinez 49 y.o. female is here because of recent diagnosis of ductal carcinoma in situ of the left breast. Screening mammogram on 10/12/19 detected a possible left breast mass. Diagnostic mammogram and [place] on 10/27/19 showed a 0.6cm mass at the 8:30 position in the left breast. Biopsy on 11/04/19 showed ductal carcinoma in situ with calcifications partially involving an intraductal papilloma, low grade, ER/PR+ 100%. She presents to the clinic today for initial evaluation and discussion of treatment options.   I reviewed her records extensively and collaborated the history with the patient.  SUMMARY OF ONCOLOGIC HISTORY: Oncology History  Ductal carcinoma in situ (DCIS) of left breast  11/04/2019 Initial Diagnosis   Screening mammogram on 10/12/19 detected a possible left breast mass. Diagnostic mammogram and [place] on 10/27/19 showed a 0.6cm mass at the 8:30 position in the left breast. Biopsy on 11/04/19 showed ductal carcinoma in situ with calcifications partially involving an intraductal papilloma, low grade, ER/PR+ 100%.      MEDICAL HISTORY:  Past Medical History:  Diagnosis Date  . Anxiety   . 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 Migraine   . Restless leg syndrome     SURGICAL HISTORY: Past Surgical History:  Procedure Laterality Date  . INTRAUTERINE DEVICE INSERTION  02/10/2016   Mirena  . KNEE SURGERY     X 4 = 1X RIGHT 3X LEFT/3 ARTHROSCOPIES AND 1 ACL  . KNEE SURGERY     ACL  . WISDOM TOOTH EXTRACTION      SOCIAL HISTORY: Social History   Socioeconomic History  . Marital status:  Married    Spouse name: Octavia Bruckner  . Number of children: 2  . Years of education: Not on file  . Highest education level: Associate degree: academic program  Occupational History    Comment: working on [employer]  Tobacco Use  . Smoking status: Former Smoker    Types: Cigarettes    Quit date: 07/09/1988    Years since quitting: 31.3  . Smokeless tobacco: Never Used  Substance and Sexual Activity  . Alcohol use: Yes    Comment: weekly  . Drug use: No  . Sexual activity: Not on file  Other Topics Concern  . Not on file  Social History Narrative   Lives with husband    caffeine, tea x 1, soda x 1   Social Determinants of Health   Financial Resource Strain:   . Difficulty of Paying Living Expenses:   Food Insecurity:   . Worried About Charity fundraiser in the Last Year:   . Arboriculturist in the Last Year:   Transportation Needs:   . Film/video editor (Medical):   Marland Kitchen Lack of Transportation (Non-Medical):   Physical Activity:   . Days of Exercise per Week:   . Minutes of Exercise per Session:   Stress:   . Feeling of Stress :   Social Connections:   . Frequency of Communication with Friends and Family:   . Frequency of Social Gatherings with Friends and Family:   . Attends Religious Services:   . Active Member of Clubs  or Organizations:   . Attends Archivist Meetings:   Marland Kitchen Marital Status:   Intimate Partner Violence:   . Fear of Current or Ex-Partner:   . Emotionally Abused:   Marland Kitchen Physically Abused:   . Sexually Abused:     FAMILY HISTORY: Family History  Problem Relation Age of Onset  . COPD Father   . CAD Father   . Cancer Paternal Grandfather        COLON  . Hyperlipidemia Brother   . Hypertension Brother   . Stroke Maternal Grandmother   . Hyperlipidemia Brother   . Cancer Paternal Aunt        PRIMARY PERITONEAL CANCER    ALLERGIES:  is allergic to diflucan [fluconazole].  MEDICATIONS:  Current Outpatient Medications  Medication Sig Dispense  Refill  . cetirizine (ZYRTEC) 10 MG tablet Take 10 mg by mouth daily.    . cholecalciferol (VITAMIN D3) 25 MCG (1000 UNIT) tablet Take 1,000 Units by mouth daily.    Marland Kitchen MAGNESIUM PO Take 1 tablet by mouth daily.    . Multiple Vitamins-Minerals (MULTIVITAMIN WITH MINERALS) tablet Take 1 tablet by mouth daily.    . sertraline (ZOLOFT) 50 MG tablet Take 50 mg by mouth daily.    . verapamil (CALAN) 120 MG tablet Take 120 mg by mouth at bedtime.     Current Facility-Administered Medications  Medication Dose Route Frequency Provider Last Rate Last Admin  . levonorgestrel (MIRENA) 20 MCG/24HR IUD   Intrauterine Once Fontaine, Belinda Block, MD        REVIEW OF SYSTEMS:   Constitutional: Denies fevers, chills or abnormal night sweats Eyes: Denies blurriness of vision, double vision or watery eyes Ears, nose, mouth, throat, and face: Denies mucositis or sore throat Respiratory: Denies cough, dyspnea or wheezes Cardiovascular: Denies palpitation, chest discomfort or lower extremity swelling Gastrointestinal:  Denies nausea, heartburn or change in bowel habits Skin: Denies abnormal skin rashes Lymphatics: Denies new lymphadenopathy or easy bruising Neurological:Denies numbness, tingling or new weaknesses Behavioral/Psych: Mood is stable, no new changes  Breast: Denies any palpable lumps or discharge All other systems were reviewed with the patient and are negative.  PHYSICAL EXAMINATION: ECOG PERFORMANCE STATUS: 1 - Symptomatic but completely ambulatory  Vitals:   11/19/19 1605  BP: 126/79  Pulse: 64  Resp: 17  Temp: 98.3 F (36.8 C)  SpO2: 100%   Filed Weights   11/19/19 1605  Weight: 175 lb (79.4 kg)    GENERAL:alert, no distress and comfortable SKIN: skin color, texture, turgor are normal, no rashes or significant lesions EYES: normal, conjunctiva are pink and non-injected, sclera clear OROPHARYNX:no exudate, no erythema and lips, buccal mucosa, and tongue normal  NECK: supple,  thyroid normal size, non-tender, without nodularity LYMPH:  no palpable lymphadenopathy in the cervical, axillary or inguinal LUNGS: clear to auscultation and percussion with normal breathing effort HEART: regular rate & rhythm and no murmurs and no lower extremity edema ABDOMEN:abdomen soft, non-tender and normal bowel sounds Musculoskeletal:no cyanosis of digits and no clubbing  PSYCH: alert & oriented x 3 with fluent speech NEURO: no focal motor/sensory deficits BREAST: No palpable nodules in breast. No palpable axillary or supraclavicular lymphadenopathy (exam performed in the presence of a chaperone)   LABORATORY DATA:  I have reviewed the data as listed Lab Results  Component Value Date   WBC 9.5 09/10/2018   HGB 14.7 09/10/2018   HCT 41.6 09/10/2018   MCV 95.2 09/10/2018   PLT 247 09/10/2018   Lab Results  Component Value Date   NA 139 09/10/2018   K 3.7 09/10/2018   CL 105 09/10/2018   CO2 26 09/10/2018    RADIOGRAPHIC STUDIES: I have personally reviewed the radiological reports and agreed with the findings in the report.  ASSESSMENT AND PLAN:  Ductal carcinoma in situ (DCIS) of left breast 10/27/2019:Screening mammogram on 10/12/19 detected a possible left breast mass. Diagnostic mammogram and [place] on 10/27/19 showed a 0.6cm mass at the 8:30 position in the left breast. Biopsy on 11/04/19 showed ductal carcinoma in situ with calcifications partially involving an intraductal papilloma, low grade, ER/PR+ 100%.   Pathology review: I discussed with the patient the difference between DCIS and invasive breast cancer. It is considered a precancerous lesion. DCIS is classified as a 0. It is generally detected through mammograms as calcifications. We discussed the significance of grades and its impact on prognosis. We also discussed the importance of ER and PR receptors and their implications to adjuvant treatment options. Prognosis of DCIS dependence on grade, comedo necrosis. It is  anticipated that if not treated, 20-30% of DCIS can develop into invasive breast cancer.  Recommendation: 1. Breast conserving surgery 2. Followed by adjuvant radiation therapy 3. Followed by antiestrogen therapy with tamoxifen 5 years  Tamoxifen counseling: We discussed the risks and benefits of tamoxifen. These include but not limited to insomnia, hot flashes, mood changes, vaginal dryness, and weight gain. Although rare, serious side effects including endometrial cancer, risk of blood clots were also discussed. We strongly believe that the benefits far outweigh the risks. Patient understands these risks and consented to starting treatment. Planned treatment duration is 5 years.  Return to clinic after surgery to discuss the final pathology report and come up with an adjuvant treatment plan.     All questions were answered. The patient knows to call the clinic with any problems, questions or concerns.   Rulon Eisenmenger, MD, MPH 11/24/2019    I, Molly Dorshimer, am acting as scribe for Nicholas Lose, MD.  I have reviewed the above documentation for accuracy and completeness, and I agree with the above.

## 2019-11-19 ENCOUNTER — Ambulatory Visit (INDEPENDENT_AMBULATORY_CARE_PROVIDER_SITE_OTHER): Payer: BC Managed Care – PPO

## 2019-11-19 ENCOUNTER — Inpatient Hospital Stay: Payer: BC Managed Care – PPO | Attending: Hematology and Oncology | Admitting: Hematology and Oncology

## 2019-11-19 DIAGNOSIS — Z87891 Personal history of nicotine dependence: Secondary | ICD-10-CM | POA: Diagnosis not present

## 2019-11-19 DIAGNOSIS — Z170 Estrogen receptor positive status [ER+]: Secondary | ICD-10-CM | POA: Diagnosis not present

## 2019-11-19 DIAGNOSIS — Z87898 Personal history of other specified conditions: Secondary | ICD-10-CM

## 2019-11-19 DIAGNOSIS — D0512 Intraductal carcinoma in situ of left breast: Secondary | ICD-10-CM | POA: Diagnosis not present

## 2019-11-19 DIAGNOSIS — R55 Syncope and collapse: Secondary | ICD-10-CM

## 2019-11-19 DIAGNOSIS — F419 Anxiety disorder, unspecified: Secondary | ICD-10-CM | POA: Insufficient documentation

## 2019-11-19 DIAGNOSIS — Z79899 Other long term (current) drug therapy: Secondary | ICD-10-CM | POA: Insufficient documentation

## 2019-11-20 NOTE — Progress Notes (Signed)
Location of Breast Cancer:  Ductal carcinoma in situ of LEFT breast  Histology per Pathology Report:  11/04/2019 Diagnosis Breast, left, needle core biopsy, lower inner, 8:30 position - DUCTAL CARCINOMA IN SITU WITH CALCIFICATIONS PARTIALLY INVOLVING AN INTRADUCTAL PAPILLOMA. Microscopic Comment The carcinoma appears low grade. Prognostic makers will be ordered.  Receptor Status: ER(100%), PR (100%)  Did patient present with symptoms (if so, please note symptoms) or was this found on screening mammography?:  Screening mammogram on 10/12/19 detected a possible left breast mass. Diagnostic mammogram and [place] on 10/27/19 showed a 0.6cm mass at the 8:30 position in the left breast.  Past/Anticipated interventions by surgeon, if any: Saw Dr. Autumn Messing on 11/12/2019 Scheduled for breast MRI on 12/03/2019 to further evaluate size of area involved.  Past/Anticipated interventions by medical oncology, if any:  Under care of Dr. Nicholas Lose 11/19/2019  Lymphedema issues, if any:  no  Pain issues, if any:  no   SAFETY ISSUES:  Prior radiation? No  Pacemaker/ICD? no  Possible current pregnancy?Mirena IUD  Is the patient on methotrexate? No  Current Complaints / other details:   Saw cardiologist Dr. Rozann Lesches on 11/18/2019 for recent syncopal episodes: "She had neurology consultation with Dr. Leta Baptist in March.  Subsequent brain MRI and EEG were both normal. Orthostatic vital signs were normal today. Very likely to be neurocardiogenic in etiology based on description.  ECG is normal showing sinus bradycardia.  She has had no sense of palpitations.  She was not orthostatic today on examination.  We discussed maintaining adequate hydration, avoiding obvious situations that might provoke symptoms and taking a seated or supine position when she starts to feel prodromal symptoms.  We also discussed the possibility of medical therapy (possibly midodrine or Florinef if conservative management  is not effective, would probably not try beta-blocker given low resting heart rate and already on Calan SR, she is already on an SSRI which sometimes is beneficial), but will hold off initiating any new medications at this point.  We will obtain a 7-day cardiac monitor to exclude any unusual heart rate variability or arrhythmias, but not felt to be of high likelihood.  This should not present a barrier to her continuing work-up for treatment of breast cancer realizing that she will ultimately be in situations that increase her vagal tone and the likelihood of neurocardiogenic events."   Wearing a heart monitor now comes off on Thursday will have been on for 7 days.  Zola Button, RN 11/20/2019,10:17 AM

## 2019-11-24 ENCOUNTER — Ambulatory Visit: Payer: BC Managed Care – PPO

## 2019-11-24 ENCOUNTER — Encounter: Payer: Self-pay | Admitting: Radiation Oncology

## 2019-11-24 ENCOUNTER — Other Ambulatory Visit: Payer: Self-pay

## 2019-11-24 ENCOUNTER — Encounter: Payer: Self-pay | Admitting: *Deleted

## 2019-11-24 ENCOUNTER — Ambulatory Visit
Admission: RE | Admit: 2019-11-24 | Discharge: 2019-11-24 | Disposition: A | Discharge status: Home / Self Care | Payer: BC Managed Care – PPO | Source: Ambulatory Visit | Attending: Radiation Oncology | Admitting: Radiation Oncology

## 2019-11-24 DIAGNOSIS — D0512 Intraductal carcinoma in situ of left breast: Secondary | ICD-10-CM

## 2019-11-24 HISTORY — DX: Intraductal carcinoma in situ of left breast: D05.12

## 2019-11-24 NOTE — Patient Instructions (Signed)
Coronavirus (COVID-19) Are you at risk?  Are you at risk for the Coronavirus (COVID-19)?  To be considered HIGH RISK for Coronavirus (COVID-19), you have to meet the following criteria:  . Traveled to [place], [place], [place], [place] or [place]; or in the [place] to [place], [place], [place], or [place]; and have fever, cough, and shortness of breath within the last 2 weeks of travel OR . Been in close contact with a person diagnosed with COVID-19 within the last 2 weeks and have fever, cough, and shortness of breath . IF YOU DO NOT MEET THESE CRITERIA, YOU ARE CONSIDERED LOW RISK FOR COVID-19.  What to do if you are HIGH RISK for COVID-19?  . If you are having a medical emergency, call 911. . Seek medical care right away. Before you go to a doctor's office, urgent care or emergency department, call ahead and tell them about your recent travel, contact with someone diagnosed with COVID-19, and your symptoms. You should receive instructions from your physician's office regarding next steps of care.  . When you arrive at healthcare provider, tell the healthcare staff immediately you have returned from visiting [place], [place], [place], [place] or [place]; or traveled in the [place] to [place], [place], [place], or [place]; in the last two weeks or you have been in close contact with a person diagnosed with COVID-19 in the last 2 weeks.   . Tell the health care staff about your symptoms: fever, cough and shortness of breath. . After you have been seen by a medical provider, you will be either: o Tested for (COVID-19) and discharged home on quarantine except to seek medical care if symptoms worsen, and asked to  - Stay home and avoid contact with others until you get your results (4-5 days)  - Avoid travel on public transportation if possible (such as bus, train, or airplane) or o Sent to the Emergency Department by EMS for evaluation, COVID-19 testing, and possible  admission depending on your condition and test results.  What to do if you are LOW RISK for COVID-19?  Reduce your risk of any infection by using the same precautions used for avoiding the common cold or flu:  . Wash your hands often with soap and warm water for at least 20 seconds.  If soap and water are not readily available, use an alcohol-based hand sanitizer with at least 60% alcohol.  . If coughing or sneezing, cover your mouth and nose by coughing or sneezing into the elbow areas of your shirt or coat, into a tissue or into your sleeve (not your hands). . Avoid shaking hands with others and consider head nods or verbal greetings only. . Avoid touching your eyes, nose, or mouth with unwashed hands.  . Avoid close contact with people who are sick. . Avoid places or events with large numbers of people in one location, like concerts or sporting events. . Carefully consider travel plans you have or are making. . If you are planning any travel outside or inside the [place], visit the CDC's Travelers' Health webpage for the latest health notices. . If you have some symptoms but not all symptoms, continue to monitor at home and seek medical attention if your symptoms worsen. . If you are having a medical emergency, call 911.   ADDITIONAL HEALTHCARE OPTIONS FOR PATIENTS  [facility] / e-Visit: [email or web address]         [facility]: [phone]  [facility]: [phone]                   [facility]: [phone]   

## 2019-11-24 NOTE — Assessment & Plan Note (Signed)
10/27/2019:Screening mammogram on 10/12/19 detected a possible left breast mass. Diagnostic mammogram and [place] on 10/27/19 showed a 0.6cm mass at the 8:30 position in the left breast. Biopsy on 11/04/19 showed ductal carcinoma in situ with calcifications partially involving an intraductal papilloma, low grade, ER/PR+ 100%.   Pathology review: I discussed with the patient the difference between DCIS and invasive breast cancer. It is considered a precancerous lesion. DCIS is classified as a 0. It is generally detected through mammograms as calcifications. We discussed the significance of grades and its impact on prognosis. We also discussed the importance of ER and PR receptors and their implications to adjuvant treatment options. Prognosis of DCIS dependence on grade, comedo necrosis. It is anticipated that if not treated, 20-30% of DCIS can develop into invasive breast cancer.  Recommendation: 1. Breast conserving surgery 2. Followed by adjuvant radiation therapy 3. Followed by antiestrogen therapy with tamoxifen 5 years  Tamoxifen counseling: We discussed the risks and benefits of tamoxifen. These include but not limited to insomnia, hot flashes, mood changes, vaginal dryness, and weight gain. Although rare, serious side effects including endometrial cancer, risk of blood clots were also discussed. We strongly believe that the benefits far outweigh the risks. Patient understands these risks and consented to starting treatment. Planned treatment duration is 5 years.  Return to clinic after surgery to discuss the final pathology report and come up with an adjuvant treatment plan.

## 2019-11-24 NOTE — Progress Notes (Signed)
Radiation Oncology         [phone] ________________________________  Initial outpatient Consultation - The patient opted for telemedicine to maximize safety during the pandemic.  MyChart video was used  Name: Pamela Martinez MRN: [id number]  Date: 11/24/2019  DOB: [birth year]  [id number], Edwinna Areola, MD  Celene Squibb, MD   REFERRING PHYSICIAN: Celene Squibb, MD  DIAGNOSIS:    ICD-10-CM   1. Ductal carcinoma in situ (DCIS) of left breast  D05.12    Stage 0 Left Breast LIQ DCIS, ER+ / PR+, Grade 1  CHIEF COMPLAINT: Here to discuss management of left breast DCIS  HISTORY OF PRESENT ILLNESS::Pamela Martinez is a 49 y.o. female who presented with breast abnormality on the following imaging: screening mammogram on the date of 10/12/2019.  Symptoms, if any, at that time, were: none.   Ultrasound of breast on 10/27/2019 revealed indeterminate 6 mm mass in left breast at 8:30.   Biopsy on date of 11/04/2019 showed DCIS with calcifications partially involving an intraductal papilloma.  ER status: 100%; PR status 100%; Grade 1.  She is scheduled for breast MRI on 12/03/2019.  She is interested in breast conserving surgery.  She works as a Advertising account planner.   She and her husband were present for the consultation today.  She is in her usual state of health.  She has 6 children, and 3 are still living at home.  PREVIOUS RADIATION THERAPY: No  PAST MEDICAL HISTORY:  has a past medical history of Anxiety, ASCUS with positive high risk HPV (07/2011, 02/2014), Ductal carcinoma in situ (DCIS) of left breast, Migraine, and Restless leg syndrome.    PAST SURGICAL HISTORY: Past Surgical History:  Procedure Laterality Date  . INTRAUTERINE DEVICE INSERTION  02/10/2016   Mirena  . KNEE SURGERY     X 4 = 1X RIGHT 3X LEFT/3 ARTHROSCOPIES AND 1 ACL  . KNEE SURGERY     ACL  . MM BREAST STEREO BIOPSY LEFT ([place] HX) Left   . WISDOM TOOTH EXTRACTION      FAMILY HISTORY: family history includes CAD in her  father; COPD in her father; Cancer in her paternal aunt and paternal grandfather; Hyperlipidemia in her brother and brother; Hypertension in her brother; Stroke in her maternal grandmother.  SOCIAL HISTORY:  reports that she quit smoking about 31 years ago. Her smoking use included cigarettes. She has never used smokeless tobacco. She reports current alcohol use. She reports that she does not use drugs.  ALLERGIES: Diflucan [fluconazole]  MEDICATIONS:  Current Outpatient Medications  Medication Sig Dispense Refill  . ALPRAZolam (XANAX) 0.5 MG tablet Take 0.5 mg by mouth at bedtime as needed for anxiety.    . cetirizine (ZYRTEC) 10 MG tablet Take 10 mg by mouth daily.    . cholecalciferol (VITAMIN D3) 25 MCG (1000 UNIT) tablet Take 1,000 Units by mouth daily.    Marland Kitchen MAGNESIUM PO Take 1 tablet by mouth daily.    . Multiple Vitamins-Minerals (MULTIVITAMIN WITH MINERALS) tablet Take 1 tablet by mouth daily.    . sertraline (ZOLOFT) 50 MG tablet Take 50 mg by mouth daily.    . verapamil (CALAN) 120 MG tablet Take 120 mg by mouth at bedtime.     Current Facility-Administered Medications  Medication Dose Route Frequency Provider Last Rate Last Admin  . levonorgestrel (MIRENA) 20 MCG/24HR IUD   Intrauterine Once Fontaine, Belinda Block, MD        REVIEW OF SYSTEMS: As above  PHYSICAL  EXAM:  vitals were not taken for this visit.   General: Alert and oriented, in no acute distress   LABORATORY DATA:  Lab Results  Component Value Date   WBC 9.5 09/10/2018   HGB 14.7 09/10/2018   HCT 41.6 09/10/2018   MCV 95.2 09/10/2018   PLT 247 09/10/2018   CMP     Component Value Date/Time   NA 139 09/10/2018 1456   K 3.7 09/10/2018 1456   CL 105 09/10/2018 1456   CO2 26 09/10/2018 1456   GLUCOSE 78 09/10/2018 1456   BUN 15 09/10/2018 1456   CREATININE 0.68 09/10/2018 1456   CALCIUM 9.4 09/10/2018 1456   PROT 6.4 09/10/2018 1456   ALBUMIN 4.2 08/24/2016 1527   AST 15 09/10/2018 1456   ALT 23  09/10/2018 1456   ALKPHOS 63 08/24/2016 1527   BILITOT 0.8 09/10/2018 1456   GFRNONAA 83 (L) 04/14/2014 1226   GFRAA >90 04/14/2014 1226         RADIOGRAPHY: US BREAST LTD UNI LEFT INC AXILLA  Result Date: 10/27/2019 CLINICAL DATA:  Screening recall for a possible left breast mass. EXAM: DIGITAL DIAGNOSTIC LEFT MAMMOGRAM WITH CAD AND TOMO ULTRASOUND LEFT BREAST COMPARISON:  Previous exam(s). ACR Breast Density Category c: The breast tissue is heterogeneously dense, which may obscure small masses. FINDINGS: The mass in the lower-inner quadrant of the left breast is less visible on the spot compression tomosynthesis images than on the screening mammogram. An obscured mass can be seen on the spot compression tomosynthesis CC images which measures approximately 5 mm. The mass is poorly seen on the spot compression tomosynthesis MLO view. Mammographic images were processed with CAD. Ultrasound of the left breast at 8:30, 4 cm from the nipple demonstrates a hypoechoic oval mass which is predominantly circumscribed does have some indistinct margins. Blood flow is seen within the mass on color Doppler imaging. Mass measures 6 x 3 x 5 mm. Ultrasound of the left axilla demonstrates multiple normal-appearing lymph nodes. IMPRESSION: 1.  There is an indeterminate mass in the left breast at 8:30. 2.  No evidence of left axillary lymphadenopathy. RECOMMENDATION: Ultrasound guided biopsy is recommended for the left breast mass. This has been scheduled for 11/04/2019 at 7:30 a.m. I have discussed the findings and recommendations with the patient. If applicable, a reminder letter will be sent to the patient regarding the next appointment. BI-RADS CATEGORY  4: Suspicious. Electronically Signed   By: Ammie Ferrier M.D.   On: 10/27/2019 11:57   MM DIAG BREAST TOMO UNI LEFT  Result Date: 10/27/2019 CLINICAL DATA:  Screening recall for a possible left breast mass. EXAM: DIGITAL DIAGNOSTIC LEFT MAMMOGRAM WITH CAD AND  TOMO ULTRASOUND LEFT BREAST COMPARISON:  Previous exam(s). ACR Breast Density Category c: The breast tissue is heterogeneously dense, which may obscure small masses. FINDINGS: The mass in the lower-inner quadrant of the left breast is less visible on the spot compression tomosynthesis images than on the screening mammogram. An obscured mass can be seen on the spot compression tomosynthesis CC images which measures approximately 5 mm. The mass is poorly seen on the spot compression tomosynthesis MLO view. Mammographic images were processed with CAD. Ultrasound of the left breast at 8:30, 4 cm from the nipple demonstrates a hypoechoic oval mass which is predominantly circumscribed does have some indistinct margins. Blood flow is seen within the mass on color Doppler imaging. Mass measures 6 x 3 x 5 mm. Ultrasound of the left axilla demonstrates multiple normal-appearing lymph nodes.  IMPRESSION: 1.  There is an indeterminate mass in the left breast at 8:30. 2.  No evidence of left axillary lymphadenopathy. RECOMMENDATION: Ultrasound guided biopsy is recommended for the left breast mass. This has been scheduled for 11/04/2019 at 7:30 a.m. I have discussed the findings and recommendations with the patient. If applicable, a reminder letter will be sent to the patient regarding the next appointment. BI-RADS CATEGORY  4: Suspicious. Electronically Signed   By: Ammie Ferrier M.D.   On: 10/27/2019 11:57   MM CLIP PLACEMENT LEFT  Result Date: 11/04/2019 CLINICAL DATA:  Evaluate RIBBON clip placement following ultrasound-guided LEFT breast biopsy. EXAM: DIAGNOSTIC LEFT MAMMOGRAM POST ULTRASOUND BIOPSY COMPARISON:  Previous exam(s). FINDINGS: Mammographic images were obtained following ultrasound guided biopsy of the 0.6 cm mass at the 8:30 position of the LEFT breast. The RIBBON biopsy marking clip is in expected position at the site of biopsy. IMPRESSION: Appropriate positioning of the RIBBON shaped biopsy marking clip  at the site of biopsy in the LOWER INNER LEFT breast. Final Assessment: Post Procedure Mammograms for Marker Placement Electronically Signed   By: Margarette Canada M.D.   On: 11/04/2019 08:22   [place] LT BREAST BX W LOC DEV 1ST LESION IMG BX SPEC US GUIDE  Addendum Date: 11/05/2019   ADDENDUM REPORT: 11/05/2019 12:08 ADDENDUM: Pathology revealed LOW GRADE DUCTAL CARCINOMA IN SITU WITH CALCIFICATIONS PARTIALLY INVOLVING AN INTRADUCTAL PAPILLOMA of the LEFT breast, lower inner, 8:30 position. This was found to be concordant by Dr. Hassan Rowan. Pathology results were discussed with the patient by telephone. The patient reported doing well after the biopsy with tenderness at the site. Post biopsy instructions and care were reviewed and questions were answered. The patient was encouraged to call [facility] for any additional concerns. Surgical consultation has been arranged with Dr. Autumn Messing at [facility] on Nov 12, 2019. Pathology results reported by Terie Purser, RN on 11/05/2019. Electronically Signed   By: Margarette Canada M.D.   On: 11/05/2019 12:08   Result Date: 11/05/2019 CLINICAL DATA:  49 year old female for tissue sampling of 0.6 cm LEFT breast mass. EXAM: ULTRASOUND GUIDED LEFT BREAST CORE NEEDLE BIOPSY COMPARISON:  Previous exam(s). FINDINGS: I met with the patient and we discussed the procedure of ultrasound-guided biopsy, including benefits and alternatives. We discussed the high likelihood of a successful procedure. We discussed the risks of the procedure, including infection, bleeding, tissue injury, clip migration, and inadequate sampling. Informed written consent was given. The usual time-out protocol was performed immediately prior to the procedure. Lesion quadrant: LOWER INNER LEFT breast Using sterile technique and 1% Lidocaine as local anesthetic, under direct ultrasound visualization, a 14 gauge spring-loaded device was used to perform biopsy of the 0.6 cm mass at the  8:30 position of the LEFT breast using a MEDIAL approach. At the conclusion of the procedure a RIBBON tissue marker clip was deployed into the biopsy cavity. Follow up 2 view mammogram was performed and dictated separately. IMPRESSION: Ultrasound guided biopsy of 0.6 cm LOWER INNER LEFT breast mass. No apparent complications. Electronically Signed: By: Margarette Canada M.D. On: 11/04/2019 08:22      IMPRESSION/PLAN: Left Breast DCIS   It was a pleasure meeting the patient today. We discussed the risks, benefits, and side effects of radiotherapy. I recommend radiotherapy to the left breast to reduce her risk of locoregional recurrence by 1/2.  We also discussed the option of observation after surgery which would carry a higher risk of  local recurrence.   We discussed that radiation would take approximately 4 weeks to complete and that I would give the patient a few weeks to heal following surgery before starting treatment planning. We spoke about acute effects including skin irritation and fatigue as well as much less common late effects including internal organ injury or irritation. We spoke about the latest technology that is used to minimize the risk of late effects for patients undergoing radiotherapy to the breast or chest wall. No guarantees of treatment were given. The patient is enthusiastic about proceeding with treatment. I look forward to participating in the patient's care.  I will await her referral back to me for postoperative follow-up and eventual CT simulation/treatment planning.  This encounter was provided by telemedicine platform MyChart video The patient has given verbal consent for this type of encounter and has been advised to only accept a meeting of this type in a secure network environment. On date of service, in total, I spent 45 minutes on this encounter. The attendants for this meeting include Eppie Gibson  and Epifanio Lesches.  During the encounter, Eppie Gibson was located  at [facility] Radiation Oncology Department.  Aryel Rudd Music therapist was located at home.    __________________________________________   Eppie Gibson, MD   This document serves as a record of services personally performed by Eppie Gibson, MD. It was created on her behalf by Wilburn Mylar, a trained medical scribe. The creation of this record is based on the scribe's personal observations and the provider's statements to them. This document has been checked and approved by the attending provider.

## 2019-12-01 ENCOUNTER — Other Ambulatory Visit: Payer: Self-pay | Admitting: General Surgery

## 2019-12-01 ENCOUNTER — Ambulatory Visit: Payer: Self-pay | Admitting: General Surgery

## 2019-12-01 DIAGNOSIS — D0512 Intraductal carcinoma in situ of left breast: Secondary | ICD-10-CM

## 2019-12-03 ENCOUNTER — Ambulatory Visit
Admission: RE | Admit: 2019-12-03 | Discharge: 2019-12-03 | Disposition: A | Discharge status: Home / Self Care | Payer: BC Managed Care – PPO | Source: Ambulatory Visit | Attending: General Surgery | Admitting: General Surgery

## 2019-12-03 ENCOUNTER — Other Ambulatory Visit: Payer: Self-pay

## 2019-12-03 DIAGNOSIS — D0512 Intraductal carcinoma in situ of left breast: Secondary | ICD-10-CM

## 2019-12-03 MED ORDER — GADOBUTROL 1 MMOL/ML IV SOLN
7.0000 mL | Freq: Once | INTRAVENOUS | Status: AC | PRN
  Administered 2019-12-03: 7 mL via INTRAVENOUS

## 2019-12-04 ENCOUNTER — Encounter: Payer: Self-pay | Admitting: *Deleted

## 2019-12-08 ENCOUNTER — Encounter: Payer: Self-pay | Admitting: *Deleted

## 2019-12-08 ENCOUNTER — Other Ambulatory Visit: Payer: Self-pay | Admitting: General Surgery

## 2019-12-08 DIAGNOSIS — R9389 Abnormal findings on diagnostic imaging of other specified body structures: Secondary | ICD-10-CM

## 2019-12-09 ENCOUNTER — Telehealth: Payer: Self-pay | Admitting: *Deleted

## 2019-12-09 NOTE — Telephone Encounter (Signed)
-----   Message from Satira Sark, MD sent at 12/08/2019  2:40 PM EDT ----- Results reviewed.  Cardiac monitor was reassuring.  She had rare atrial and ventricular ectopy which is commonly seen and would not be an explanation for her syncopal events.  Importantly, there were no sustained arrhythmias or pauses.  No further cardiac work-up anticipated at this time.

## 2019-12-09 NOTE — Telephone Encounter (Signed)
Patient informed. Copy sent to PCP °

## 2019-12-10 ENCOUNTER — Encounter (HOSPITAL_BASED_OUTPATIENT_CLINIC_OR_DEPARTMENT_OTHER): Payer: Self-pay | Admitting: General Surgery

## 2019-12-11 ENCOUNTER — Other Ambulatory Visit: Payer: Self-pay

## 2019-12-11 ENCOUNTER — Encounter (HOSPITAL_BASED_OUTPATIENT_CLINIC_OR_DEPARTMENT_OTHER): Payer: Self-pay | Admitting: General Surgery

## 2019-12-14 ENCOUNTER — Other Ambulatory Visit: Payer: Self-pay

## 2019-12-14 ENCOUNTER — Encounter (HOSPITAL_COMMUNITY): Payer: Self-pay

## 2019-12-14 ENCOUNTER — Ambulatory Visit
Admission: RE | Admit: 2019-12-14 | Discharge: 2019-12-14 | Disposition: A | Discharge status: Home / Self Care | Payer: BC Managed Care – PPO | Source: Ambulatory Visit | Attending: General Surgery | Admitting: General Surgery

## 2019-12-14 ENCOUNTER — Emergency Department (HOSPITAL_COMMUNITY)
Admission: EM | Admit: 2019-12-14 | Discharge: 2019-12-14 | Disposition: A | Discharge status: Home / Self Care | Payer: BC Managed Care – PPO | Attending: Emergency Medicine | Admitting: Emergency Medicine

## 2019-12-14 ENCOUNTER — Other Ambulatory Visit: Payer: Self-pay | Admitting: Diagnostic Radiology

## 2019-12-14 DIAGNOSIS — Z87891 Personal history of nicotine dependence: Secondary | ICD-10-CM | POA: Insufficient documentation

## 2019-12-14 DIAGNOSIS — Z79899 Other long term (current) drug therapy: Secondary | ICD-10-CM | POA: Insufficient documentation

## 2019-12-14 DIAGNOSIS — R9389 Abnormal findings on diagnostic imaging of other specified body structures: Secondary | ICD-10-CM

## 2019-12-14 DIAGNOSIS — L7622 Postprocedural hemorrhage and hematoma of skin and subcutaneous tissue following other procedure: Secondary | ICD-10-CM | POA: Diagnosis not present

## 2019-12-14 DIAGNOSIS — D0512 Intraductal carcinoma in situ of left breast: Secondary | ICD-10-CM | POA: Diagnosis not present

## 2019-12-14 DIAGNOSIS — R58 Hemorrhage, not elsewhere classified: Secondary | ICD-10-CM

## 2019-12-14 MED ORDER — GADOBUTROL 1 MMOL/ML IV SOLN
7.0000 mL | Freq: Once | INTRAVENOUS | Status: AC | PRN
  Administered 2019-12-14: 7 mL via INTRAVENOUS

## 2019-12-14 NOTE — ED Provider Notes (Signed)
[facility]GENCY DEPARTMENT Provider Note   CSN: [id number] Arrival date & time: 12/14/19  1730     History Chief Complaint  Patient presents with  . Breast Biopsy Site Bleeding    Pamela Martinez is a 49 y.o. female with a past medical history of anxiety, neurocardiogenic syncope, ductal carcinoma in situ of left breast, breast biopsy earlier today who presents today for evaluation of bleeding at the right breast biopsy site.  She states that the gauze was bloody and saturated.  She does not take any blood thinning medications.  She denies any other trauma.      HPI     Past Medical History:  Diagnosis Date  . Anxiety   . 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 Asthma    no inhalers  . Ductal carcinoma in situ (DCIS) of left breast   . Joint pain   . Migraine   . Neurocardiogenic syncope   . PONV (postoperative nausea and vomiting)   . Restless leg syndrome     Patient Active Problem List   Diagnosis Date Noted  . Ductal carcinoma in situ (DCIS) of left breast 11/24/2019  . IUD (intrauterine device) in place 02/23/2016  . Mild dysplasia of cervix 03/17/2012    Past Surgical History:  Procedure Laterality Date  . INTRAUTERINE DEVICE INSERTION  02/10/2016   Mirena  . KNEE SURGERY     X 4 = 1X RIGHT 3X LEFT/3 ARTHROSCOPIES AND 1 ACL  . KNEE SURGERY     ACL  . MM BREAST STEREO BIOPSY LEFT ([place] HX) Left   . WISDOM TOOTH EXTRACTION       OB History    Gravida  2   Para  2   Term      Preterm      AB  0   Living  2     SAB      TAB      Ectopic  0   Multiple      Live Births              Family History  Problem Relation Age of Onset  . COPD Father   . CAD Father   . Cancer Paternal Grandfather        COLON  . Hyperlipidemia Brother   . Hypertension Brother   . Stroke Maternal Grandmother   .  Hyperlipidemia Brother   . Cancer Paternal Aunt        PRIMARY PERITONEAL CANCER    Social History   Tobacco Use  . Smoking status: Former Smoker    Types: Cigarettes    Quit date: 07/09/1988    Years since quitting: 31.4  . Smokeless tobacco: Never Used  Substance Use Topics  . Alcohol use: Yes    Comment: weekly a couple of drinks  . Drug use: No    Home Medications Prior to Admission medications   Medication Sig Start Date End Date Taking? Authorizing Provider  ALPRAZolam Duanne Moron) 0.5 MG tablet Take 0.5 mg by mouth at bedtime as needed for anxiety.    [provider]  cetirizine (ZYRTEC) 10 MG tablet Take 10 mg by mouth daily.    [provider]  cholecalciferol (VITAMIN D3) 25 MCG (1000 UNIT) tablet Take 1,000 Units by mouth daily.    [provider]  MAGNESIUM PO  Take 1 tablet by mouth daily.    [provider]  Multiple Vitamins-Minerals (MULTIVITAMIN WITH MINERALS) tablet Take 1 tablet by mouth daily.    [provider]  sertraline (ZOLOFT) 50 MG tablet Take 50 mg by mouth daily. 09/17/19   [provider]  thiamine (VITAMIN B-1) 50 MG tablet Take 50 mg by mouth daily.    [provider]  verapamil (CALAN) 120 MG tablet Take 120 mg by mouth at bedtime. 08/27/19   [provider]  levonorgestrel (MIRENA) 20 MCG/24HR IUD 1 each by Intrauterine route once. INSERTED 05-01-06.   09/02/17  [provider]    Allergies    Diflucan [fluconazole] and Sulfa antibiotics  Review of Systems   Review of Systems  Constitutional: Negative for chills and fever.  Skin: Positive for wound.  All other systems reviewed and are negative.   Physical Exam Updated Vital Signs BP 115/80 (BP Location: Right Arm)   Pulse 60   Temp 98.5 F (36.9 C) (Oral)   Resp 14   Ht 5\' 4"  (1.626 m)   Wt 77.1 kg   LMP 12/01/2019 (Exact Date)   SpO2 99%   BMI 29.18 kg/m   Physical Exam Vitals and nursing note reviewed.  Exam conducted with a chaperone present Deneise Lever, Therapist, sports).  Constitutional:      General: She is not in acute distress.    Appearance: She is not ill-appearing.  HENT:     Head: Normocephalic.  Cardiovascular:     Rate and Rhythm: Normal rate.  Pulmonary:     Effort: Pulmonary effort is normal. No respiratory distress.  Chest:     Comments: Left breast with approx 81mm V shaped wound inferior lateral to the nipple.  There is brisk oozing from the wound.  Using sterile gloves in place dressing was removed.  The Steri-Strips were nonadherent around the puncture and were therefore removed. Neurological:     Mental Status: She is alert.     ED Results / Procedures / Treatments   Labs (all labs ordered are listed, but only abnormal results are displayed) Labs Reviewed - No data to display  EKG None  Radiology-    Procedures Procedures (including critical care time)  Medications Ordered in ED Medications - No data to display  ED Course  I have reviewed the triage vital signs and the nursing notes.  Pertinent labs & imaging results that were available during my care of the patient were reviewed by me and considered in my medical decision making (see chart for details).  Clinical Course as of Dec 14 2151  Mon Dec 14, 2019  1850 Patient has been holding pressure on the wound for about 20 minutes, reevaluated, there is no strikethrough on the dressing.  Patient instructed to stop putting pressure, will reevaluate, anticipate discharge.   [EH]    Clinical Course User Index [EH] Ollen Gross   MDM Rules/Calculators/A&P                     Patient is a 49 year old woman who presents today for evaluation of biopsy site bleeding.  She had a right breast biopsy this morning and reports that it has been bleeding.  On exam the gauze that had been placed earlier appears saturated, and the Steri-Strips were no longer adherent.  These were removed with sterile gloves.  Sterile  dressing was placed and patient was instructed to provide pressure for 20 minutes.  On repeat evaluation the  bleeding had stopped and there was no strikethrough on the dressing.  She was observed in the emergency room for an additional 15 minutes without recurrent bleeding.  Patient is discharged home.  We discussed bleeding control measures along with the importance of notifying her surgeon.  Return precautions were discussed with patient who states their understanding.  At the time of discharge patient denied any unaddressed complaints or concerns.  Patient is agreeable for discharge home.  Note: Portions of this report may have been transcribed using voice recognition software. Every effort was made to ensure accuracy; however, inadvertent computerized transcription errors may be present   Final Clinical Impression(s) / ED Diagnoses Final diagnoses:  Bleeding    Rx / DC Orders ED Discharge Orders    None       Ollen Gross 12/14/19 2154    Isla Pence, MD 12/14/19 2304

## 2019-12-14 NOTE — ED Notes (Signed)
Patient verbalized understanding of DC instructions and follow up care with her surgeon. Ambulatory.

## 2019-12-14 NOTE — Discharge Instructions (Signed)
Please contact your surgeon to let them know that you are in the emergency room for bleeding.  If the bleeding starts again, is uncontrolled, or you have other concerns please seek additional medical care and evaluation.

## 2019-12-14 NOTE — ED Triage Notes (Signed)
Pt arrives POV for eval of biopsy site bleeding. Pt had biopsy this AM and noted that she was bleeding through the gauze this afternoon. Gauze is bloody, but still in place from this AM.

## 2019-12-15 ENCOUNTER — Other Ambulatory Visit: Payer: Self-pay | Admitting: General Surgery

## 2019-12-15 ENCOUNTER — Other Ambulatory Visit (HOSPITAL_COMMUNITY): Payer: BC Managed Care – PPO

## 2019-12-15 ENCOUNTER — Encounter: Payer: Self-pay | Admitting: *Deleted

## 2019-12-15 DIAGNOSIS — R9389 Abnormal findings on diagnostic imaging of other specified body structures: Secondary | ICD-10-CM

## 2019-12-18 ENCOUNTER — Ambulatory Visit (HOSPITAL_BASED_OUTPATIENT_CLINIC_OR_DEPARTMENT_OTHER): Admit: 2019-12-18 | Payer: BC Managed Care – PPO | Admitting: General Surgery

## 2019-12-18 HISTORY — DX: Unspecified asthma, uncomplicated: J45.909

## 2019-12-18 HISTORY — DX: Other specified postprocedural states: Z98.890

## 2019-12-18 HISTORY — DX: Nausea with vomiting, unspecified: R11.2

## 2019-12-18 HISTORY — DX: Syncope and collapse: R55

## 2019-12-18 HISTORY — DX: Nausea with vomiting, unspecified: Z98.890

## 2019-12-18 HISTORY — DX: Pain in unspecified joint: M25.50

## 2019-12-18 SURGERY — BREAST LUMPECTOMY WITH RADIOACTIVE SEED LOCALIZATION
Anesthesia: General | Site: Breast | Laterality: Left

## 2019-12-23 ENCOUNTER — Other Ambulatory Visit: Payer: BC Managed Care – PPO

## 2019-12-23 ENCOUNTER — Encounter: Payer: Self-pay | Admitting: *Deleted

## 2019-12-28 ENCOUNTER — Other Ambulatory Visit: Payer: BC Managed Care – PPO

## 2019-12-29 ENCOUNTER — Ambulatory Visit
Admission: RE | Admit: 2019-12-29 | Discharge: 2019-12-29 | Disposition: A | Discharge status: Home / Self Care | Payer: BC Managed Care – PPO | Source: Ambulatory Visit | Attending: General Surgery | Admitting: General Surgery

## 2019-12-29 ENCOUNTER — Other Ambulatory Visit: Payer: Self-pay

## 2019-12-29 ENCOUNTER — Other Ambulatory Visit (HOSPITAL_COMMUNITY): Payer: Self-pay | Admitting: Diagnostic Radiology

## 2019-12-29 DIAGNOSIS — R9389 Abnormal findings on diagnostic imaging of other specified body structures: Secondary | ICD-10-CM

## 2019-12-29 MED ORDER — GADOBUTROL 1 MMOL/ML IV SOLN
7.0000 mL | Freq: Once | INTRAVENOUS | Status: AC | PRN
  Administered 2019-12-29: 7 mL via INTRAVENOUS

## 2019-12-31 ENCOUNTER — Encounter: Payer: Self-pay | Admitting: *Deleted

## 2020-01-05 ENCOUNTER — Ambulatory Visit: Payer: Self-pay | Admitting: General Surgery

## 2020-01-05 DIAGNOSIS — D241 Benign neoplasm of right breast: Secondary | ICD-10-CM

## 2020-01-05 DIAGNOSIS — D0512 Intraductal carcinoma in situ of left breast: Secondary | ICD-10-CM

## 2020-01-07 ENCOUNTER — Other Ambulatory Visit: Payer: Self-pay | Admitting: General Surgery

## 2020-01-07 DIAGNOSIS — D0512 Intraductal carcinoma in situ of left breast: Secondary | ICD-10-CM

## 2020-01-12 ENCOUNTER — Telehealth: Payer: Self-pay | Admitting: Hematology and Oncology

## 2020-01-12 ENCOUNTER — Encounter: Payer: Self-pay | Admitting: *Deleted

## 2020-01-12 DIAGNOSIS — D0512 Intraductal carcinoma in situ of left breast: Secondary | ICD-10-CM

## 2020-01-12 NOTE — Telephone Encounter (Signed)
Scheduled appt per 7/6 schmsg -mailed reminder letter with appt date and time  ° °

## 2020-01-13 ENCOUNTER — Telehealth: Payer: Self-pay | Admitting: *Deleted

## 2020-01-13 NOTE — Telephone Encounter (Signed)
LVM for call back to schedule appointment and planning session with Dr. Isidore Moos.

## 2020-01-20 ENCOUNTER — Encounter: Payer: Self-pay | Admitting: *Deleted

## 2020-01-26 ENCOUNTER — Other Ambulatory Visit: Payer: Self-pay

## 2020-01-26 ENCOUNTER — Encounter (HOSPITAL_COMMUNITY)
Admission: RE | Admit: 2020-01-26 | Discharge: 2020-01-26 | Disposition: A | Discharge status: Home / Self Care | Payer: BC Managed Care – PPO | Source: Ambulatory Visit | Attending: General Surgery | Admitting: General Surgery

## 2020-01-26 ENCOUNTER — Encounter (HOSPITAL_COMMUNITY): Payer: Self-pay

## 2020-01-26 ENCOUNTER — Other Ambulatory Visit (HOSPITAL_COMMUNITY)
Admission: RE | Admit: 2020-01-26 | Discharge: 2020-01-26 | Disposition: A | Discharge status: Home / Self Care | Payer: BC Managed Care – PPO | Source: Ambulatory Visit | Attending: General Surgery | Admitting: General Surgery

## 2020-01-26 DIAGNOSIS — Z20822 Contact with and (suspected) exposure to covid-19: Secondary | ICD-10-CM | POA: Diagnosis not present

## 2020-01-26 DIAGNOSIS — Z01812 Encounter for preprocedural laboratory examination: Secondary | ICD-10-CM | POA: Insufficient documentation

## 2020-01-26 LAB — POCT PREGNANCY, URINE: Preg Test, Ur: NEGATIVE

## 2020-01-26 LAB — CBC
HCT: 45.2 % (ref 36.0–46.0)
Hemoglobin: 14.8 g/dL (ref 12.0–15.0)
MCH: 31.7 pg (ref 26.0–34.0)
MCHC: 32.7 g/dL (ref 30.0–36.0)
MCV: 96.8 fL (ref 80.0–100.0)
Platelets: 264 10*3/uL (ref 150–400)
RBC: 4.67 MIL/uL (ref 3.87–5.11)
RDW: 12.2 % (ref 11.5–15.5)
WBC: 7.3 10*3/uL (ref 4.0–10.5)
nRBC: 0 % (ref 0.0–0.2)

## 2020-01-26 LAB — SARS CORONAVIRUS 2 (TAT 6-24 HRS): SARS Coronavirus 2: NEGATIVE

## 2020-01-26 NOTE — Pre-Procedure Instructions (Signed)
Pamela Martinez  01/26/2020    Your procedure is scheduled on Friday, January 29, 2020 at 10:00 AM.   Report to [facility] Entrance "A" Admitting Office at 8:00 AM.   Call this number if you have problems the morning of surgery: [phone]   Questions prior to day of surgery, please call [phone] between 8 & 4 PM.   Remember:  Do not eat food after midnight Thursday, 01/28/20.  You may drink clear liquids until 7:00 AM.  Clear liquids allowed are:  Water, Juice (non-citric and without pulp - diabetics please choose diet or no sugar options), Carbonated beverages - (diabetics please choose diet or no sugar options), Clear Tea, Black Coffee only (no creamer, milk or cream including half and half) and Gatorade (diabetics please choose diet or no sugar options)    Take these medicines the morning of surgery with A SIP OF WATER: Cetirizine (Zyrtec), Sertraline (Zoloft)  Stop Vitamins as of today prior to surgery. Do not use Aspirin containing products, NSAIDS (Ibuprofen, Aleve, etc), Herbal medications or Fish Oil prior to surgery.    Do not wear jewelry, make-up or nail polish.  Do not wear lotions, powders, perfumes or deodorant.  Do not shave 48 hours prior to surgery.    Do not bring valuables to the hospital.  [facility] is not responsible for any belongings or valuables.  Contacts, dentures or bridgework may not be worn into surgery.  Leave your suitcase in the car.  After surgery it may be brought to your room.  For patients admitted to the hospital, discharge time will be determined by your treatment team.  Patients discharged the day of surgery will not be allowed to drive home.   [place] - Preparing for Surgery  Before surgery, you can play an important role.  Because skin is not sterile, your skin needs to be as free of germs as possible.  You can reduce the number of germs on you skin by washing with CHG (chlorahexidine gluconate) soap before surgery.   CHG is an antiseptic cleaner which kills germs and bonds with the skin to continue killing germs even after washing.  Oral Hygiene is also important in reducing the risk of infection.  Remember to brush your teeth with your regular toothpaste the morning of surgery.  Please DO NOT use if you have an allergy to CHG or antibacterial soaps.  If your skin becomes reddened/irritated stop using the CHG and inform your nurse when you arrive at Short Stay.  Do not shave (including legs and underarms) for at least 48 hours prior to the first CHG shower.  You may shave your face.  Please follow these instructions carefully:   1.  Shower with CHG Soap the night before surgery and the morning of Surgery.  2.  If you choose to wash your hair, wash your hair first as usual with your normal shampoo.  3.  After you shampoo, rinse your hair and body thoroughly to remove the shampoo. 4.  Use CHG as you would any other liquid soap.  You can apply chg directly to the skin and wash gently with a      scrungie or washcloth.           5.  Apply the CHG Soap to your body ONLY FROM THE NECK DOWN.   Do not use on open wounds or open sores. Avoid contact with your eyes, ears, mouth and genitals (private parts).  Wash genitals (private  parts) with your normal soap - do this prior to using CHG soap.  6.  Wash thoroughly, paying special attention to the area where your surgery will be performed.  7.  Thoroughly rinse your body with warm water from the neck down.  8.  DO NOT shower/wash with your normal soap after using and rinsing off the CHG Soap.  9.  Pat yourself dry with a clean towel.            10.  Wear clean pajamas.            11.  Place clean sheets on your bed the night of your first shower and do not sleep with pets.  Day of Surgery  Shower as above. Do not apply any lotions/deodorants the morning of surgery.   Please wear clean clothes to the hospital. Remember to brush your teeth with toothpaste.  Please  read over the fact sheets that you were given.

## 2020-01-26 NOTE — Progress Notes (Signed)
PCP - Dr. Allyn Kenner Cardiologist - Dr. [facility]  Chest x-ray - N/A EKG - 11/18/19 Stress Test - denies ECHO - denies Cardiac Cath - denies  Sleep Study - denies CPAP - denies  Blood Thinner Instructions: N/A Aspirin Instructions:N/A  ERAS Protcol -protocol initiated. Pt to stop clears by 0700 DOS.  PRE-SURGERY Ensure or G2- no drink ordered.   COVID TEST- Pt to be tested after PAT appointment.    Anesthesia review: No  Patient denies shortness of breath, fever, cough and chest pain at PAT appointment   All instructions explained to the patient, with a verbal understanding of the material. Patient agrees to go over the instructions while at home for a better understanding. Patient also instructed to self quarantine after being tested for COVID-19. The opportunity to ask questions was provided.    Coronavirus Screening  Have you experienced the following symptoms:  Cough yes/no: No Fever (>100.58F)  yes/no: No Runny nose yes/no: No Sore throat yes/no: No Difficulty breathing/shortness of breath  yes/no: No  Have you or a family member traveled in the last 14 days and where? yes/no: No   If the patient indicates "YES" to the above questions, their PAT will be rescheduled to limit the exposure to others and, the surgeon will be notified. THE PATIENT WILL NEED TO BE ASYMPTOMATIC FOR 14 DAYS.   If the patient is not experiencing any of these symptoms, the PAT nurse will instruct them to NOT bring anyone with them to their appointment since they may have these symptoms or traveled as well.   Please remind your patients and families that hospital visitation restrictions are in effect and the importance of the restrictions.

## 2020-01-28 ENCOUNTER — Other Ambulatory Visit: Payer: Self-pay

## 2020-01-28 ENCOUNTER — Ambulatory Visit
Admission: RE | Admit: 2020-01-28 | Discharge: 2020-01-28 | Disposition: A | Discharge status: Home / Self Care | Payer: BC Managed Care – PPO | Source: Ambulatory Visit | Attending: General Surgery | Admitting: General Surgery

## 2020-01-28 DIAGNOSIS — D0512 Intraductal carcinoma in situ of left breast: Secondary | ICD-10-CM

## 2020-01-28 DIAGNOSIS — D241 Benign neoplasm of right breast: Secondary | ICD-10-CM

## 2020-01-29 ENCOUNTER — Ambulatory Visit (HOSPITAL_COMMUNITY)
Admission: RE | Admit: 2020-01-29 | Discharge: 2020-01-29 | Disposition: A | Discharge status: Home / Self Care | Payer: BC Managed Care – PPO | Source: Ambulatory Visit | Attending: General Surgery | Admitting: General Surgery

## 2020-01-29 ENCOUNTER — Encounter (HOSPITAL_COMMUNITY): Payer: Self-pay | Admitting: General Surgery

## 2020-01-29 ENCOUNTER — Encounter (HOSPITAL_COMMUNITY)
Admission: RE | Disposition: A | Discharge status: Home / Self Care | Payer: Self-pay | Source: Ambulatory Visit | Attending: General Surgery

## 2020-01-29 ENCOUNTER — Ambulatory Visit
Admission: RE | Admit: 2020-01-29 | Discharge: 2020-01-29 | Disposition: A | Discharge status: Home / Self Care | Payer: BC Managed Care – PPO | Source: Ambulatory Visit | Attending: General Surgery | Admitting: General Surgery

## 2020-01-29 ENCOUNTER — Other Ambulatory Visit: Payer: Self-pay

## 2020-01-29 ENCOUNTER — Ambulatory Visit (HOSPITAL_COMMUNITY): Payer: BC Managed Care – PPO | Admitting: Anesthesiology

## 2020-01-29 DIAGNOSIS — E669 Obesity, unspecified: Secondary | ICD-10-CM | POA: Insufficient documentation

## 2020-01-29 DIAGNOSIS — Z6829 Body mass index (BMI) 29.0-29.9, adult: Secondary | ICD-10-CM | POA: Insufficient documentation

## 2020-01-29 DIAGNOSIS — D0512 Intraductal carcinoma in situ of left breast: Secondary | ICD-10-CM

## 2020-01-29 DIAGNOSIS — Z87891 Personal history of nicotine dependence: Secondary | ICD-10-CM | POA: Insufficient documentation

## 2020-01-29 DIAGNOSIS — J45909 Unspecified asthma, uncomplicated: Secondary | ICD-10-CM | POA: Insufficient documentation

## 2020-01-29 DIAGNOSIS — N6081 Other benign mammary dysplasias of right breast: Secondary | ICD-10-CM | POA: Diagnosis not present

## 2020-01-29 DIAGNOSIS — N6011 Diffuse cystic mastopathy of right breast: Secondary | ICD-10-CM | POA: Diagnosis not present

## 2020-01-29 DIAGNOSIS — D241 Benign neoplasm of right breast: Secondary | ICD-10-CM | POA: Diagnosis present

## 2020-01-29 DIAGNOSIS — Z79899 Other long term (current) drug therapy: Secondary | ICD-10-CM | POA: Insufficient documentation

## 2020-01-29 DIAGNOSIS — Z170 Estrogen receptor positive status [ER+]: Secondary | ICD-10-CM | POA: Diagnosis not present

## 2020-01-29 DIAGNOSIS — F419 Anxiety disorder, unspecified: Secondary | ICD-10-CM | POA: Diagnosis not present

## 2020-01-29 HISTORY — PX: BREAST LUMPECTOMY WITH RADIOACTIVE SEED LOCALIZATION: SHX6424

## 2020-01-29 SURGERY — BREAST LUMPECTOMY WITH RADIOACTIVE SEED LOCALIZATION
Anesthesia: General | Site: Breast | Laterality: Bilateral

## 2020-01-29 MED ORDER — BUPIVACAINE HCL (PF) 0.25 % IJ SOLN
INTRAMUSCULAR | Status: AC
  Filled 2020-01-29: qty 30

## 2020-01-29 MED ORDER — HYDROCODONE-ACETAMINOPHEN 5-325 MG PO TABS: 1.0000 | ORAL_TABLET | Freq: Four times a day (QID) | ORAL | 0 refills | Status: DC | PRN

## 2020-01-29 MED ORDER — 0.9 % SODIUM CHLORIDE (POUR BTL) OPTIME
TOPICAL | Status: DC | PRN
  Administered 2020-01-29: 1000 mL

## 2020-01-29 MED ORDER — BUPIVACAINE-EPINEPHRINE (PF) 0.25% -1:200000 IJ SOLN
INTRAMUSCULAR | Status: DC | PRN
  Administered 2020-01-29: 30 mL via PERINEURAL

## 2020-01-29 MED ORDER — LIDOCAINE HCL (CARDIAC) PF 100 MG/5ML IV SOSY
PREFILLED_SYRINGE | INTRAVENOUS | Status: DC | PRN
  Administered 2020-01-29: 80 mg via INTRAVENOUS

## 2020-01-29 MED ORDER — ACETAMINOPHEN 500 MG PO TABS
1000.0000 mg | ORAL_TABLET | ORAL | Status: AC
  Administered 2020-01-29: 1000 mg via ORAL
  Filled 2020-01-29: qty 2

## 2020-01-29 MED ORDER — ORAL CARE MOUTH RINSE: 15.0000 mL | Freq: Once | OROMUCOSAL | Status: AC

## 2020-01-29 MED ORDER — GABAPENTIN 300 MG PO CAPS
300.0000 mg | ORAL_CAPSULE | ORAL | Status: DC
  Filled 2020-01-29: qty 1

## 2020-01-29 MED ORDER — MIDAZOLAM HCL 5 MG/5ML IJ SOLN
INTRAMUSCULAR | Status: DC | PRN
  Administered 2020-01-29: 2 mg via INTRAVENOUS

## 2020-01-29 MED ORDER — CHLORHEXIDINE GLUCONATE 0.12 % MT SOLN
15.0000 mL | Freq: Once | OROMUCOSAL | Status: AC
  Administered 2020-01-29: 15 mL via OROMUCOSAL
  Filled 2020-01-29: qty 15

## 2020-01-29 MED ORDER — CHLORHEXIDINE GLUCONATE CLOTH 2 % EX PADS: 6.0000 | MEDICATED_PAD | Freq: Once | CUTANEOUS | Status: DC

## 2020-01-29 MED ORDER — ONDANSETRON HCL 4 MG/2ML IJ SOLN
INTRAMUSCULAR | Status: DC | PRN
  Administered 2020-01-29: 4 mg via INTRAVENOUS

## 2020-01-29 MED ORDER — OXYCODONE HCL 5 MG PO TABS
5.0000 mg | ORAL_TABLET | Freq: Once | ORAL | Status: AC
  Administered 2020-01-29: 5 mg via ORAL

## 2020-01-29 MED ORDER — LIDOCAINE 2% (20 MG/ML) 5 ML SYRINGE
INTRAMUSCULAR | Status: AC
  Filled 2020-01-29: qty 5

## 2020-01-29 MED ORDER — SCOPOLAMINE 1 MG/3DAYS TD PT72
1.0000 | MEDICATED_PATCH | Freq: Once | TRANSDERMAL | Status: DC
  Administered 2020-01-29: 1.5 mg via TRANSDERMAL
  Filled 2020-01-29: qty 1

## 2020-01-29 MED ORDER — CELECOXIB 200 MG PO CAPS
200.0000 mg | ORAL_CAPSULE | ORAL | Status: AC
  Administered 2020-01-29: 200 mg via ORAL
  Filled 2020-01-29: qty 1

## 2020-01-29 MED ORDER — LACTATED RINGERS IV SOLN: INTRAVENOUS | Status: DC

## 2020-01-29 MED ORDER — EPHEDRINE 5 MG/ML INJ
INTRAVENOUS | Status: AC
  Filled 2020-01-29: qty 10

## 2020-01-29 MED ORDER — GLYCOPYRROLATE PF 0.2 MG/ML IJ SOSY
PREFILLED_SYRINGE | INTRAMUSCULAR | Status: AC
  Filled 2020-01-29: qty 1

## 2020-01-29 MED ORDER — DEXAMETHASONE SODIUM PHOSPHATE 10 MG/ML IJ SOLN
INTRAMUSCULAR | Status: AC
  Filled 2020-01-29: qty 1

## 2020-01-29 MED ORDER — PROPOFOL 10 MG/ML IV BOLUS
INTRAVENOUS | Status: AC
  Filled 2020-01-29: qty 20

## 2020-01-29 MED ORDER — GLYCOPYRROLATE PF 0.2 MG/ML IJ SOSY
PREFILLED_SYRINGE | INTRAMUSCULAR | Status: DC | PRN
  Administered 2020-01-29: .2 mg via INTRAVENOUS

## 2020-01-29 MED ORDER — ONDANSETRON HCL 4 MG/2ML IJ SOLN
INTRAMUSCULAR | Status: AC
  Filled 2020-01-29: qty 2

## 2020-01-29 MED ORDER — FENTANYL CITRATE (PF) 250 MCG/5ML IJ SOLN
INTRAMUSCULAR | Status: AC
  Filled 2020-01-29: qty 5

## 2020-01-29 MED ORDER — FENTANYL CITRATE (PF) 250 MCG/5ML IJ SOLN
INTRAMUSCULAR | Status: DC | PRN
  Administered 2020-01-29 (×2): 50 ug via INTRAVENOUS

## 2020-01-29 MED ORDER — CEFAZOLIN SODIUM-DEXTROSE 2-4 GM/100ML-% IV SOLN
2.0000 g | INTRAVENOUS | Status: AC
  Administered 2020-01-29: 2 g via INTRAVENOUS
  Filled 2020-01-29: qty 100

## 2020-01-29 MED ORDER — GABAPENTIN 300 MG PO CAPS: 300.0000 mg | ORAL_CAPSULE | ORAL | Status: DC

## 2020-01-29 MED ORDER — DEXAMETHASONE SODIUM PHOSPHATE 10 MG/ML IJ SOLN
INTRAMUSCULAR | Status: DC | PRN
  Administered 2020-01-29: 10 mg via INTRAVENOUS

## 2020-01-29 MED ORDER — FENTANYL CITRATE (PF) 100 MCG/2ML IJ SOLN
INTRAMUSCULAR | Status: AC
  Filled 2020-01-29: qty 2

## 2020-01-29 MED ORDER — OXYCODONE HCL 5 MG PO TABS
ORAL_TABLET | ORAL | Status: AC
  Filled 2020-01-29: qty 1

## 2020-01-29 MED ORDER — FENTANYL CITRATE (PF) 100 MCG/2ML IJ SOLN
25.0000 ug | INTRAMUSCULAR | Status: DC | PRN
  Administered 2020-01-29 (×4): 25 ug via INTRAVENOUS

## 2020-01-29 MED ORDER — EPHEDRINE SULFATE-NACL 50-0.9 MG/10ML-% IV SOSY
PREFILLED_SYRINGE | INTRAVENOUS | Status: DC | PRN
  Administered 2020-01-29: 5 mg via INTRAVENOUS
  Administered 2020-01-29: 10 mg via INTRAVENOUS
  Administered 2020-01-29: 5 mg via INTRAVENOUS
  Administered 2020-01-29: 10 mg via INTRAVENOUS
  Administered 2020-01-29: 5 mg via INTRAVENOUS
  Administered 2020-01-29: 10 mg via INTRAVENOUS
  Administered 2020-01-29: 5 mg via INTRAVENOUS

## 2020-01-29 MED ORDER — MIDAZOLAM HCL 2 MG/2ML IJ SOLN
INTRAMUSCULAR | Status: AC
  Filled 2020-01-29: qty 2

## 2020-01-29 MED ORDER — PROMETHAZINE HCL 25 MG/ML IJ SOLN: 6.2500 mg | INTRAMUSCULAR | Status: DC | PRN

## 2020-01-29 MED ORDER — PROPOFOL 10 MG/ML IV BOLUS
INTRAVENOUS | Status: DC | PRN
  Administered 2020-01-29: 30 mg via INTRAVENOUS
  Administered 2020-01-29: 50 mg via INTRAVENOUS
  Administered 2020-01-29: 100 mg via INTRAVENOUS

## 2020-01-29 SURGICAL SUPPLY — 37 items
ADH SKN CLS APL DERMABOND .7 (GAUZE/BANDAGES/DRESSINGS) ×1
APL PRP STRL LF DISP 70% ISPRP (MISCELLANEOUS) ×1
APPLIER CLIP 9.375 MED OPEN (MISCELLANEOUS) ×3
APR CLP MED 9.3 20 MLT OPN (MISCELLANEOUS) ×1
BINDER BREAST LRG (GAUZE/BANDAGES/DRESSINGS) ×2 IMPLANT
BINDER BREAST XLRG (GAUZE/BANDAGES/DRESSINGS) IMPLANT
CANISTER SUCT 3000ML PPV (MISCELLANEOUS) ×3 IMPLANT
CHLORAPREP W/TINT 26 (MISCELLANEOUS) ×3 IMPLANT
CLIP APPLIE 9.375 MED OPEN (MISCELLANEOUS) IMPLANT
COVER PROBE W GEL 5X96 (DRAPES) ×3 IMPLANT
COVER SURGICAL LIGHT HANDLE (MISCELLANEOUS) ×3 IMPLANT
COVER WAND RF STERILE (DRAPES) IMPLANT
DERMABOND ADVANCED (GAUZE/BANDAGES/DRESSINGS) ×2
DERMABOND ADVANCED .7 DNX12 (GAUZE/BANDAGES/DRESSINGS) ×1 IMPLANT
DEVICE DUBIN SPECIMEN MAMMOGRA (MISCELLANEOUS) ×3 IMPLANT
DRAPE CHEST BREAST 15X10 FENES (DRAPES) ×3 IMPLANT
ELECT CAUTERY BLADE 6.4 (BLADE) ×2 IMPLANT
ELECT COATED BLADE 2.86 ST (ELECTRODE) ×3 IMPLANT
ELECT REM PT RETURN 9FT ADLT (ELECTROSURGICAL) ×3
ELECTRODE REM PT RTRN 9FT ADLT (ELECTROSURGICAL) ×1 IMPLANT
GAUZE SPONGE 4X4 12PLY STRL (GAUZE/BANDAGES/DRESSINGS) ×2 IMPLANT
GLOVE BIO SURGEON STRL SZ7.5 (GLOVE) ×6 IMPLANT
GOWN STRL REUS W/ TWL LRG LVL3 (GOWN DISPOSABLE) ×2 IMPLANT
GOWN STRL REUS W/TWL LRG LVL3 (GOWN DISPOSABLE) ×6
KIT BASIN OR (CUSTOM PROCEDURE TRAY) ×3 IMPLANT
KIT MARKER MARGIN INK (KITS) ×3 IMPLANT
LIGHT WAVEGUIDE WIDE FLAT (MISCELLANEOUS) IMPLANT
NDL HYPO 25GX1X1/2 BEV (NEEDLE) ×1 IMPLANT
NEEDLE HYPO 25GX1X1/2 BEV (NEEDLE) ×3 IMPLANT
NS IRRIG 1000ML POUR BTL (IV SOLUTION) ×3 IMPLANT
PACK GENERAL/GYN (CUSTOM PROCEDURE TRAY) ×3 IMPLANT
SUT MNCRL AB 4-0 PS2 18 (SUTURE) ×5 IMPLANT
SUT SILK 2 0 SH (SUTURE) ×2 IMPLANT
SUT VIC AB 3-0 SH 18 (SUTURE) ×3 IMPLANT
SYR CONTROL 10ML LL (SYRINGE) ×3 IMPLANT
TOWEL GREEN STERILE (TOWEL DISPOSABLE) ×1 IMPLANT
TOWEL GREEN STERILE FF (TOWEL DISPOSABLE) ×3 IMPLANT

## 2020-01-29 NOTE — Anesthesia Preprocedure Evaluation (Addendum)
Anesthesia Evaluation  Patient identified by MRN, date of birth, ID band Patient awake    Reviewed: Allergy & Precautions, NPO status , Patient's Chart, lab work & pertinent test results  History of Anesthesia Complications (+) PONV and history of anesthetic complications  Airway Mallampati: II  TM Distance: >3 FB Neck ROM: Full    Dental  (+) Teeth Intact, Dental Advisory Given   Pulmonary asthma , former smoker,    Pulmonary exam normal breath sounds clear to auscultation       Cardiovascular Exercise Tolerance: Good negative cardio ROS Normal cardiovascular exam Rhythm:Regular Rate:Normal     Neuro/Psych  Headaches, PSYCHIATRIC DISORDERS Anxiety Neurocardiogenic syncope    GI/Hepatic negative GI ROS, Neg liver ROS,   Endo/Other  Obesity   Renal/GU negative Renal ROS     Musculoskeletal negative musculoskeletal ROS (+)   Abdominal   Peds  Hematology negative hematology ROS (+)   Anesthesia Other Findings Day of surgery medications reviewed with the patient.  Reproductive/Obstetrics                             Anesthesia Physical Anesthesia Plan  ASA: II  Anesthesia Plan: General   Post-op Pain Management:    Induction: Intravenous  PONV Risk Score and Plan: 4 or greater and Scopolamine patch - Pre-op, Midazolam, Dexamethasone, Ondansetron and Diphenhydramine  Airway Management Planned: LMA  Additional Equipment:   Intra-op Plan:   Post-operative Plan: Extubation in OR  Informed Consent: I have reviewed the patients History and Physical, chart, labs and discussed the procedure including the risks, benefits and alternatives for the proposed anesthesia with the patient or authorized representative who has indicated his/her understanding and acceptance.     Dental advisory given  Plan Discussed with: CRNA  Anesthesia Plan Comments:        Anesthesia Quick  Evaluation

## 2020-01-29 NOTE — H&P (Signed)
[place]  Location: [facility] Patient #: [id number] DOB: [birth year] Married / Language: English / Race: White Female   History of Present Illness  The patient is a 49 year old female who presents with breast cancer. We are asked to see the patient in consultation by Dr. Elon Alas to evaluate her for a new left breast cancer. The patient is a 49 year old white female who recently went for a routine screening mammogram. At that time she was found to have a 6 mm area of calcification in the lower inner quadrant of the left breast. 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 She does have a personal history of breast cysts but no family history of breast cancer. She does not smoke. She does have an appointment next week with Dr. Domenic Polite and cardiology to evaluate her for passing out spells that she has had recently.   Past Surgical History  Breast Biopsy  Left. Knee Surgery  Bilateral.  Diagnostic Studies History  Colonoscopy  never Mammogram  within last year Pap Smear  1-5 years ago  Allergies  No Known Drug Allergies    Medication History ALPRAZolam (0.5MG  Tablet, Oral) Active. Verapamil HCl (120MG  Tablet, Oral) Active. Sertraline HCl (50MG  Tablet, Oral) Active. ZyrTEC Allergy (10MG  Tablet, Oral) Active. Magnesium (Oral) Specific strength unknown - Active. Multi-Vitamin (Oral) Active. Vitamin D (Oral) Specific strength unknown - Active. Medications Reconciled  Social History Alcohol use  Moderate alcohol use. Caffeine use  Carbonated beverages, Tea. No drug use  Tobacco use  Former smoker.  Family History  Cancer  Father. Cerebrovascular Accident  Family Members In [place] Members In General. Hypertension  Brother, Father. Respiratory Condition  Father.  Pregnancy / Birth History  Age at menarche  48 years. Contraceptive History  Intrauterine  device, Oral contraceptives. Gravida  2 Irregular periods  Length (months) of breastfeeding  7-12 Maternal age  45-35 Para  2  Other Problems  Anxiety Disorder  Asthma  Breast Cancer  General anesthesia - complications  Hemorrhoids  Kidney Stone  Lump In Breast  Migraine Headache     Review of Systems General Not Present- Appetite Loss, Chills, Fatigue, Fever, Night Sweats, Weight Gain and Weight Loss. Skin Not Present- Change in Wart/Mole, Dryness, Hives, Jaundice, New Lesions, Non-Healing Wounds, Rash and Ulcer. HEENT Present- Seasonal Allergies and Wears glasses/contact lenses. Not Present- Earache, Hearing Loss, Hoarseness, Nose Bleed, Oral Ulcers, Ringing in the Ears, Sinus Pain, Sore Throat, Visual Disturbances and Yellow Eyes. Breast Present- Breast Mass. Not Present- Breast Pain, Nipple Discharge and Skin Changes. Cardiovascular Present- Leg Cramps. Not Present- Chest Pain, Difficulty Breathing Lying Down, Palpitations, Rapid Heart Rate, Shortness of Breath and Swelling of Extremities. Gastrointestinal Not Present- Abdominal Pain, Bloating, Bloody Stool, Change in Bowel Habits, Chronic diarrhea, Constipation, Difficulty Swallowing, Excessive gas, Gets full quickly at meals, Hemorrhoids, Indigestion, Nausea, Rectal Pain and Vomiting. Female Genitourinary Not Present- Frequency, Nocturia, Painful Urination, Pelvic Pain and Urgency. Musculoskeletal Not Present- Back Pain, Joint Pain, Joint Stiffness, Muscle Pain, Muscle Weakness and Swelling of Extremities. Neurological Present- Fainting and Headaches. Not Present- Decreased Memory, Numbness, Seizures, Tingling, Tremor, Trouble walking and Weakness. Psychiatric Present- Anxiety and Change in Sleep Pattern. Not Present- Bipolar, Depression, Fearful and Frequent crying. Endocrine Present- Hot flashes. Not Present- Cold Intolerance, Excessive Hunger, Hair Changes, Heat Intolerance and New Diabetes. Hematology Not  Present- Blood Thinners, Easy Bruising, Excessive bleeding, Gland problems, HIV and Persistent  Infections.  Vitals Weight: 172.13 lb Height: 64in Body Surface Area: 1.84 m Body Mass Index: 29.54 kg/m  Temp.: 44F (Oral)  Pulse: 88 (Regular)  BP: 122/86(Sitting, Left Arm, Standard)       Physical Exam General Mental Status-Alert. General Appearance-Consistent with stated age. Hydration-Well hydrated. Voice-Normal.  Head and Neck Head-normocephalic, atraumatic with no lesions or palpable masses. Trachea-midline. Thyroid Gland Characteristics - normal size and consistency.  Eye Eyeball - Bilateral-Extraocular movements intact. Sclera/Conjunctiva - Bilateral-No scleral icterus.  Chest and Lung Exam Chest and lung exam reveals -quiet, even and easy respiratory effort with no use of accessory muscles and on auscultation, normal breath sounds, no adventitious sounds and normal vocal resonance. Inspection Chest Wall - Normal. Back - normal.  Breast Note: There is no palpable mass in either breast. There is no palpable axillary, supraclavicular, or cervical lymphadenopathy.   Cardiovascular Cardiovascular examination reveals -normal heart sounds, regular rate and rhythm with no murmurs and normal pedal pulses bilaterally.  Abdomen Inspection Inspection of the abdomen reveals - No Hernias. Skin - Scar - no surgical scars. Palpation/Percussion Palpation and Percussion of the abdomen reveal - Soft, Non Tender, No Rebound tenderness, No Rigidity (guarding) and No hepatosplenomegaly. Auscultation Auscultation of the abdomen reveals - Bowel sounds normal.  Neurologic Neurologic evaluation reveals -alert and oriented x 3 with no impairment of recent or remote memory. Mental Status-Normal.  Musculoskeletal Normal Exam - Left-Upper Extremity Strength Normal and Lower Extremity Strength Normal. Normal Exam - Right-Upper Extremity  Strength Normal and Lower Extremity Strength Normal.  Lymphatic Head & Neck  General Head & Neck Lymphatics: Bilateral - Description - Normal. Axillary  General Axillary Region: Bilateral - Description - Normal. Tenderness - Non Tender. Femoral & Inguinal  Generalized Femoral & Inguinal Lymphatics: Bilateral - Description - Normal. Tenderness - Non Tender.    Assessment & Plan  DUCTAL CARCINOMA IN SITU (DCIS) OF LEFT BREAST (D05.12) Impression: The patient appears to have a 6 mm area of ductal carcinoma in situ in the lower inner quadrant of the left breast. I have talked to her in detail today about the different options for treatment. Given the diagnosis of DCIS would recommend getting an MRI scan to further evaluate the size of the area involved. She would like to wait for the MRI results before making a decision between lumpectomy and mastectomy. She also has an appointment with Dr. Domenic Polite and cardiology next week to evaluate her for episodes of passing out. We will get cardiac clearance from him after he is evaluated her. I will call her with the results of the MRI. I have discussed with her in detail the risks and benefits as well as some of the technical aspects of the different surgeries and she understands. I will also go ahead and refer her to medical and radiation oncology did talk about adjuvant therapy. This patient encounter took 60 minutes today to perform the following: take history, perform exam, review outside records, interpret imaging, counsel the patient on their diagnosis and document encounter, findings & plan in the EHR Current Plans Referred to Oncology, for evaluation and follow up (Oncology). Routine. 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 She has elected for bilateral rsl  lumpectomies

## 2020-01-29 NOTE — Anesthesia Postprocedure Evaluation (Signed)
Anesthesia Post Note  Patient: Data processing manager  Procedure(s) Performed: BILATERAL BREAST LUMPECTOMY WITH RADIOACTIVE SEED LOCALIZATION (Bilateral Breast)     Patient location during evaluation: PACU Anesthesia Type: General Level of consciousness: awake and alert Pain management: pain level controlled Vital Signs Assessment: post-procedure vital signs reviewed and stable Respiratory status: spontaneous breathing, nonlabored ventilation, respiratory function stable and patient connected to nasal cannula oxygen Cardiovascular status: blood pressure returned to baseline and stable Postop Assessment: no apparent nausea or vomiting Anesthetic complications: no   No complications documented.  Last Vitals:  Vitals:   01/29/20 1212 01/29/20 1227  BP: 115/73 (!) 117/62  Pulse: 65 69  Resp: 13 12  Temp:  36.6 C  SpO2: 99% 100%    Last Pain:  Vitals:   01/29/20 1227  TempSrc:   PainSc: 3                  Daquane Aguilar DAVID

## 2020-01-29 NOTE — Anesthesia Procedure Notes (Signed)
Procedure Name: LMA Insertion Performed by: Michele Rockers, CRNA Pre-anesthesia Checklist: Patient identified, Emergency Drugs available, Suction available and Patient being monitored Patient Re-evaluated:Patient Re-evaluated prior to induction Oxygen Delivery Method: Circle system utilized Preoxygenation: Pre-oxygenation with 100% oxygen Induction Type: IV induction Ventilation: Mask ventilation without difficulty LMA: LMA inserted LMA Size: 4.0 Tube type: Oral Number of attempts: 1 Airway Equipment and Method: Oral airway Placement Confirmation: positive ETCO2 and breath sounds checked- equal and bilateral Tube secured with: Tape Dental Injury: Teeth and Oropharynx as per pre-operative assessment

## 2020-01-29 NOTE — Op Note (Signed)
01/29/2020  11:01 AM  PATIENT:  Pamela Martinez  49 y.o. female  PRE-OPERATIVE DIAGNOSIS:  LEFT BREAST DUCTAL CARCINOMA IN SITU, RIGHT BREAST PAPILLOMA  POST-OPERATIVE DIAGNOSIS:  LEFT BREAST DUCTAL CARCINOMA IN SITU, RIGHT   PROCEDURE:  Procedure(s): BILATERAL BREAST LUMPECTOMY WITH RADIOACTIVE SEED LOCALIZATION (Bilateral)  SURGEON:  Surgeon(s) and Role:    * Jovita Kussmaul, MD - Primary  PHYSICIAN ASSISTANT:   ASSISTANTS: none   ANESTHESIA:   local and general  EBL:  minimal   BLOOD ADMINISTERED:none  DRAINS: none   LOCAL MEDICATIONS USED:  MARCAINE     SPECIMEN:  Source of Specimen:  bilateral breast tissue  DISPOSITION OF SPECIMEN:  PATHOLOGY  COUNTS:  YES  TOURNIQUET:  * No tourniquets in log *  DICTATION: .Dragon Dictation   After informed consent was obtained the patient was brought to the operating room and placed in the supine position on the operating table.  After adequate induction of general anesthesia the patient's bilateral chest, breast, and axillary areas were prepped with ChloraPrep, allowed to dry, and draped in usual sterile manner.  An appropriate timeout was performed.  Previously an I-125 seed was placed in the lower inner quadrant of the left breast to mark an area of ductal carcinoma in situ.  Another seed was placed in the lower outer right breast centrally to mark an area of an intraductal papilloma.  The neoprobe was set to I-125 and attention was first turned to the right breast.  The area of radioactivity was readily identified.  The area around this was infiltrated with quarter percent Marcaine.  A curvilinear incision was made along the lower outer edge of the areola with a 15 blade knife.  The incision was carried through the skin and subcutaneous tissue sharply with the electrocautery.  The seed was fairly superficial to the skin.  A circular portion of breast tissue was then excised sharply around the radioactive seed while checking the  area of radioactivity frequently.  Once the specimen was removed it was oriented with the appropriate paint colors.  A specimen radiograph was obtained that showed the clip and seed to be near the center of the specimen.  The specimen was then sent to pathology for further evaluation.  Hemostasis was achieved using the Bovie electrocautery.  The wound was irrigated with saline and infiltrated with more quarter percent Marcaine.  The deep layer of the wound was then closed with layers of interrupted 3-0 Vicryl stitches.  The skin was closed with interrupted 4-0 Monocryl subcuticular stitches.  Attention was then turned to the left breast.  The area of radioactivity was readily identified in the area around this was infiltrated with quarter percent Marcaine.  An inframammary fold incision was then made along the lower inner aspect of the left breast with a 15 blade knife.  The incision was carried through the skin and subcutaneous tissue sharply with the electrocautery until the dissection reached the chest wall.  The breast was then separated from the pectoralis muscle sharply in the lower inner quadrant with the electrocautery.  Dissection was then carried out between the breast tissue and the subcutaneous fat along the lower inner quadrant.  Once the dissection was well beyond the area of the radioactive seed I then removed a circular portion of breast tissue sharply around the radioactive seed while checking the area of radioactivity frequently.  Once the specimen was removed it was oriented with the appropriate paint colors.  A specimen radiograph was obtained  that showed the clip and seed to be near the center of the specimen.  The specimen was then sent to pathology for further evaluation.  Hemostasis was achieved using the Bovie electrocautery.  The wound was infiltrated with more quarter percent Marcaine and irrigated with saline.  The cavity was marked with clips.  The deep layer of the wound was then closed  with layers of interrupted 3-0 Vicryl stitches.  The skin was then closed with a running 4-0 Monocryl subcuticular stitch.  Dermabond dressings were applied.  The patient tolerated procedure well.  At the end of the case all needle sponge and instrument counts were correct.  The patient was then awakened and taken to recovery in stable condition.  PLAN OF CARE: Discharge to home after PACU  PATIENT DISPOSITION:  PACU - hemodynamically stable.   Delay start of Pharmacological VTE agent (>24hrs) due to surgical blood loss or risk of bleeding: not applicable

## 2020-01-29 NOTE — Transfer of Care (Signed)
Immediate Anesthesia Transfer of Care Note  Patient: Pamela Martinez  Procedure(s) Performed: BILATERAL BREAST LUMPECTOMY WITH RADIOACTIVE SEED LOCALIZATION (Bilateral Breast)  Patient Location: PACU  Anesthesia Type:General  Level of Consciousness: drowsy, patient cooperative and responds to stimulation  Airway & Oxygen Therapy: Patient Spontanous Breathing and Patient connected to face mask oxygen  Post-op Assessment: Report given to RN, Post -op Vital signs reviewed and stable and Patient moving all extremities X 4  Post vital signs: Reviewed and stable  Last Vitals:  Vitals Value Taken Time  BP 92/57 01/29/20 1113  Temp    Pulse 70 01/29/20 1115  Resp 12 01/29/20 1115  SpO2 100 % 01/29/20 1115  Vitals shown include unvalidated device data.  Last Pain:  Vitals:   01/29/20 0836  TempSrc:   PainSc: 0-No pain      Patients Stated Pain Goal: 3 (80/16/55 3748)  Complications: No complications documented.

## 2020-01-29 NOTE — Interval H&P Note (Signed)
History and Physical Interval Note:  01/29/2020 8:46 AM  Pamela Martinez  has presented today for surgery, with the diagnosis of LEFT BREAST DUCTAL CARCINOMA IN SITU, RIGHT BREAST PAPILLOMA.  The various methods of treatment have been discussed with the patient and family. After consideration of risks, benefits and other options for treatment, the patient has consented to  Procedure(s): BILATERAL BREAST LUMPECTOMY WITH RADIOACTIVE SEED LOCALIZATION (Bilateral) as a surgical intervention.  The patient's history has been reviewed, patient examined, no change in status, stable for surgery.  I have reviewed the patient's chart and labs.  Questions were answered to the patient's satisfaction.     Autumn Messing III

## 2020-01-30 ENCOUNTER — Encounter (HOSPITAL_COMMUNITY): Payer: Self-pay | Admitting: General Surgery

## 2020-02-01 ENCOUNTER — Other Ambulatory Visit: Payer: Self-pay | Admitting: Physician Assistant

## 2020-02-04 ENCOUNTER — Encounter: Payer: Self-pay | Admitting: *Deleted

## 2020-02-04 LAB — SURGICAL PATHOLOGY

## 2020-02-04 NOTE — Progress Notes (Signed)
Patient Care Team: Celene Squibb, MD as PCP - General (Internal Medicine) Satira Sark, MD as PCP - Cardiology (Cardiology) Mauro Kaufmann, RN as Oncology Nurse Navigator Rockwell Germany, RN as Oncology Nurse Navigator Jovita Kussmaul, MD as Surgeon (General Surgery) Nicholas Lose, MD as Medical Oncologist (Hematology and Oncology) Eppie Gibson, MD as Radiation Oncologist (Radiation Oncology)  DIAGNOSIS:    ICD-10-CM   1. Ductal carcinoma in situ (DCIS) of left breast  D05.12     SUMMARY OF ONCOLOGIC HISTORY: Oncology History  Ductal carcinoma in situ (DCIS) of left breast  11/04/2019 Initial Diagnosis   Screening mammogram on 10/12/19 detected a possible left breast mass. Diagnostic mammogram and [place] on 10/27/19 showed a 0.6cm mass at the 8:30 position in the left breast. Biopsy on 11/04/19 showed ductal carcinoma in situ with calcifications partially involving an intraductal papilloma, low grade, ER/PR+ 100%.    01/29/2020 Surgery   Bilateral lumpectomies [name]): left breast: low grade DCIS involving papilloma, clear margins; right breast: no evidence of carcinoma     CHIEF COMPLIANT: Follow-up s/p lumpectomy to review pathology  INTERVAL HISTORY: Pamela Martinez is a 49 y.o. with above-mentioned history of ductal carcinoma in situ of the left breast. She underwent bilateral lumpectomies on 01/29/20 with Dr. [name] for which pathology confirmed low grade DCIS involving papilloma, clear margins, and no evidence of carcinoma in the right breast. She presents to the clinic today to review the pathology report and discuss further treatment.  Patient has noticed a fine rash on her belly which is causing her severe itching.  ALLERGIES:  is allergic to diflucan [fluconazole] and sulfa antibiotics.  MEDICATIONS:  Current Outpatient Medications  Medication Sig Dispense Refill  . ALPRAZolam (XANAX) 0.5 MG tablet Take 0.5 mg by mouth at bedtime as needed for anxiety.    . cetirizine  (ZYRTEC) 10 MG tablet Take 10 mg by mouth daily.    . Cholecalciferol (VITAMIN D) 50 MCG (2000 UT) tablet Take 2,000 Units by mouth daily.     Marland Kitchen HYDROcodone-acetaminophen (NORCO/VICODIN) 5-325 MG tablet Take 1-2 tablets by mouth every 6 (six) hours as needed for moderate pain or severe pain. 10 tablet 0  . MAGNESIUM PO Take 400 mg by mouth daily.     . Multiple Vitamins-Minerals (MULTIVITAMIN WITH MINERALS) tablet Take 1 tablet by mouth daily.    . sertraline (ZOLOFT) 50 MG tablet Take 50 mg by mouth daily.    Marland Kitchen thiamine (VITAMIN B-1) 50 MG tablet Take 50 mg by mouth daily.     Current Facility-Administered Medications  Medication Dose Route Frequency Provider Last Rate Last Admin  . levonorgestrel (MIRENA) 20 MCG/24HR IUD   Intrauterine Once Fontaine, Belinda Block, MD        PHYSICAL EXAMINATION: ECOG PERFORMANCE STATUS: 1 - Symptomatic but completely ambulatory  Vitals:   02/05/20 0821  BP: 122/68  Pulse: 67  Resp: 20  Temp: 98.7 F (37.1 C)  SpO2: 99%   Filed Weights   02/05/20 0821  Weight: 176 lb 1.6 oz (79.9 kg)    LABORATORY DATA:  I have reviewed the data as listed CMP Latest Ref Rng & Units 09/10/2018 09/02/2017 08/24/2016  Glucose 65 - 99 mg/dL 78 84 79  BUN 7 - 25 mg/dL 15 16 15   Creatinine 0.50 - 1.05 mg/dL 0.68 0.81 0.87  Sodium 135 - 146 mmol/L 139 137 139  Potassium 3.5 - 5.3 mmol/L 3.7 3.5 3.5  Chloride 98 - 110 mmol/L 105  106 107  CO2 20 - 32 mmol/L 26 24 20   Calcium 8.6 - 10.4 mg/dL 9.4 9.2 9.3  Total Protein 6.1 - 8.1 g/dL 6.4 6.6 6.6  Total Bilirubin 0.2 - 1.2 mg/dL 0.8 0.8 0.7  Alkaline Phos 33 - 115 U/L - - 63  AST 10 - 35 U/L 15 19 19   ALT 6 - 29 U/L 23 35(H) 26    Lab Results  Component Value Date   WBC 7.3 01/26/2020   HGB 14.8 01/26/2020   HCT 45.2 01/26/2020   MCV 96.8 01/26/2020   PLT 264 01/26/2020   NEUTROABS 6,014 09/10/2018    ASSESSMENT & PLAN:  Ductal carcinoma in situ (DCIS) of left breast 01/29/20: Bilateral lumpectomies [name]):    Left breast: low grade DCIS involving papilloma, clear margins ER/PR 100%;  Right breast: no evidence of carcinoma  Pathology counseling: I discussed the final pathology report of the patient provided  a copy of this report. I discussed the margins. We also discussed the final staging along with previously performed ER/PR testing.  Treatment plan: 1.  Adjuvant radiation therapy 2. followed by adjuvant antiestrogen therapy with tamoxifen x5 years  Belly rash: Unclear etiology it could be something to do with the anesthesia or the recent surgery.  It is causing her severe itching.  I sent a prescription for Medrol Dosepak. Patient plans to go to the beach in a week.  I discussed with her about not swimming in the ocean.  Return to clinic after radiation is complete to start antiestrogens  No orders of the defined types were placed in this encounter.  The patient has a good understanding of the overall plan. she agrees with it. she will call with any problems that may develop before the next visit here.  Total time spent: 30 mins including face to face time and time spent for planning, charting and coordination of care  Nicholas Lose, MD 02/05/2020  I, Cloyde Reams Dorshimer, am acting as scribe for Dr. Nicholas Lose.  I have reviewed the above documentation for accuracy and completeness, and I agree with the above.

## 2020-02-05 ENCOUNTER — Inpatient Hospital Stay: Payer: BC Managed Care – PPO | Attending: Hematology and Oncology | Admitting: Hematology and Oncology

## 2020-02-05 ENCOUNTER — Other Ambulatory Visit: Payer: Self-pay

## 2020-02-05 DIAGNOSIS — Z793 Long term (current) use of hormonal contraceptives: Secondary | ICD-10-CM | POA: Diagnosis not present

## 2020-02-05 DIAGNOSIS — Z79899 Other long term (current) drug therapy: Secondary | ICD-10-CM | POA: Diagnosis not present

## 2020-02-05 DIAGNOSIS — D0512 Intraductal carcinoma in situ of left breast: Secondary | ICD-10-CM | POA: Diagnosis present

## 2020-02-05 DIAGNOSIS — Z170 Estrogen receptor positive status [ER+]: Secondary | ICD-10-CM | POA: Diagnosis not present

## 2020-02-05 MED ORDER — METHYLPREDNISOLONE 4 MG PO TBPK
ORAL_TABLET | ORAL | 0 refills | Status: DC
Start: 2020-02-05 — End: 2020-02-23

## 2020-02-05 NOTE — Assessment & Plan Note (Signed)
01/29/20: Bilateral lumpectomies Pamela Martinez):  Left breast: low grade DCIS involving papilloma, clear margins ER/PR 100%;  Right breast: no evidence of carcinoma  Pathology counseling: I discussed the final pathology report of the patient provided  a copy of this report. I discussed the margins. We also discussed the final staging along with previously performed ER/PR testing.  Treatment plan: 1.  Adjuvant radiation therapy 2. followed by adjuvant antiestrogen therapy with tamoxifen x5 years  Return to clinic after radiation is complete to start antiestrogens

## 2020-02-08 ENCOUNTER — Telehealth: Payer: Self-pay | Admitting: Hematology and Oncology

## 2020-02-08 NOTE — Telephone Encounter (Signed)
No 7/30 los, no changes made to pt schedule

## 2020-02-22 NOTE — Progress Notes (Signed)
Location of Breast Cancer: Ductal carcinoma in situ (DCIS) of LEFT breast   Histology per Pathology Report:  01/29/2020 FINAL MICROSCOPIC DIAGNOSIS:  A. BREAST, RIGHT, LUMPECTOMY:  - Benign breast tissue with fibrocystic changes including apocrine  metaplasia  - Previous biopsy site changes  - No carcinoma identified  B. BREAST, LEFT, LUMPECTOMY:  - Ductal carcinoma in situ, low grade, involving papilloma  - Margins uninvolved by carcinoma  - Previous biopsy site changes present  - Usual ductal hyperplasia, columnar cell and fibrocystic changes with  calcifications   Receptor Status: ER(100%), PR (100%)  Did patient present with symptoms (if so, please note symptoms) or was this found on screening mammography?: Screening mammogram on 10/12/2019 detected a possible left breast mass. Diagnostic mammogram and [place] on 10/2018/21 showed a 0.6cm mass at the 8:30 position in the left breast  Past/Anticipated interventions by surgeon, if any: 01/29/2020 Dr. Autumn Messing BILATERAL BREAST LUMPECTOMY WITH RADIOACTIVE SEED LOCALIZATION   Past/Anticipated interventions by medical oncology, if any:  Under care of Dr. Nicholas Lose Treatment plan: 1. Adjuvant radiation therapy 2. Followed by adjuvant antiestrogen therapy with tamoxifen x5 years  Return to clinic after radiation is complete to start antiestrogens  Lymphedema issues, if any:  None   Pain issues, if any:  Sensitivity along incisions and occasional "twinges", but otherwise no concerns  SAFETY ISSUES:  Prior radiation? No  Pacemaker/ICD? No  Possible current pregnancy? No-Mirena IUD  Is the patient on methotrexate? No  Current Complaints / other details:  Nothing of note

## 2020-02-23 ENCOUNTER — Ambulatory Visit
Admission: RE | Admit: 2020-02-23 | Discharge: 2020-02-23 | Disposition: A | Discharge status: Home / Self Care | Payer: BC Managed Care – PPO | Source: Ambulatory Visit | Attending: Radiation Oncology | Admitting: Radiation Oncology

## 2020-02-23 ENCOUNTER — Encounter: Payer: Self-pay | Admitting: Radiation Oncology

## 2020-02-23 ENCOUNTER — Other Ambulatory Visit: Payer: Self-pay

## 2020-02-23 ENCOUNTER — Telehealth: Payer: Self-pay

## 2020-02-23 VITALS — BP 120/75 | HR 63 | Temp 98.7°F | Resp 18 | Ht 64.0 in | Wt 182.1 lb

## 2020-02-23 DIAGNOSIS — D0512 Intraductal carcinoma in situ of left breast: Secondary | ICD-10-CM | POA: Insufficient documentation

## 2020-02-23 DIAGNOSIS — Z79899 Other long term (current) drug therapy: Secondary | ICD-10-CM | POA: Insufficient documentation

## 2020-02-23 DIAGNOSIS — Z510 Encounter for antineoplastic radiation therapy: Secondary | ICD-10-CM | POA: Insufficient documentation

## 2020-02-23 DIAGNOSIS — Z170 Estrogen receptor positive status [ER+]: Secondary | ICD-10-CM | POA: Insufficient documentation

## 2020-02-23 LAB — PREGNANCY, URINE: Preg Test, Ur: NEGATIVE

## 2020-02-23 NOTE — Telephone Encounter (Signed)
Called patient to let her know her urine pregnancy test was negative, and that since her COVID test could not be done until Thursday (and it has ~72 hr turnaround) her CT simulation for radiation was moved to 13:30 on 03/01/20. Should her results come back sooner, I provided her with my direct callback number so she can update me, and we can see about moving her CT simulation appointment up. Patient verbalized understanding and agreement with plan. No other needs identified at this time.

## 2020-02-23 NOTE — Progress Notes (Signed)
Radiation Oncology         [phone]) [phone] ________________________________  Name: Pamela Martinez MRN: [id number]  Date: 02/23/2020  DOB: 10/20/1970  Follow-Up Visit Note  Outpatient  CC: Celene Squibb, MD  Nicholas Lose, MD  Diagnosis:      ICD-10-CM   1. Ductal carcinoma in situ (DCIS) of left breast  D05.12 Ambulatory referral to Social Work    Pregnancy, urine   Stage 0  CHIEF COMPLAINT: Here to discuss management of DCIS  Narrative:  The patient returns today for follow-up.      Location of Breast Cancer: Ductal carcinoma in situ (DCIS) of LEFT breast   Since consultation the patient underwent breast conserving surgery with results as below:  Histology per Pathology Report:  01/29/2020 FINAL MICROSCOPIC DIAGNOSIS:  A. BREAST, RIGHT, LUMPECTOMY:  - Benign breast tissue with fibrocystic changes including apocrine  metaplasia  - Previous biopsy site changes  - No carcinoma identified  B. BREAST, LEFT, LUMPECTOMY:  - Ductal carcinoma in situ, low grade, involving papilloma  - Margins uninvolved by carcinoma  - Previous biopsy site changes present  - Usual ductal hyperplasia, columnar cell and fibrocystic changes with  calcifications   Receptor Status: ER(100%), PR (100%)  Past/Anticipated interventions by medical oncology, if any:  Under care of Dr. Nicholas Lose Treatment plan: 1. Adjuvant radiation therapy 2. Followed by adjuvant antiestrogen therapy with tamoxifen x5 years  Return to clinic after radiation is complete to start antiestrogens  Lymphedema issues, if any:  None   Pain issues, if any:  Sensitivity along incisions and occasional "twinges", but otherwise no concerns  SAFETY ISSUES:  Prior radiation? No  Pacemaker/ICD? No  Possible current pregnancy? No-Mirena IUD  Is the patient on methotrexate? No  Current Complaints / other details: During the consultation the patient received a call from her friend.  They had been vacationing  together last week and the friend just tested positive for Covid 19.           ALLERGIES:  is allergic to diflucan [fluconazole] and sulfa antibiotics.  Meds: Current Outpatient Medications  Medication Sig Dispense Refill  . ALPRAZolam (XANAX) 0.5 MG tablet Take 0.5 mg by mouth at bedtime as needed for anxiety.    . cetirizine (ZYRTEC) 10 MG tablet Take 10 mg by mouth daily.    . Cholecalciferol (VITAMIN D) 50 MCG (2000 UT) tablet Take 2,000 Units by mouth daily.     Marland Kitchen MAGNESIUM PO Take 400 mg by mouth daily.     . Multiple Vitamins-Minerals (MULTIVITAMIN WITH MINERALS) tablet Take 1 tablet by mouth daily.    . sertraline (ZOLOFT) 50 MG tablet Take 50 mg by mouth daily.    Marland Kitchen thiamine (VITAMIN B-1) 50 MG tablet Take 50 mg by mouth daily.     Current Facility-Administered Medications  Medication Dose Route Frequency Provider Last Rate Last Admin  . levonorgestrel (MIRENA) 20 MCG/24HR IUD   Intrauterine Once Fontaine, Belinda Block, MD        Physical Findings:  height is 5' 4"  (1.626 m) and weight is 182 lb 2 oz (82.6 kg). Her oral temperature is 98.7 F (37.1 C). Her blood pressure is 120/75 and her pulse is 63. Her respiration is 18 and oxygen saturation is 99%. .     General: Alert and oriented, in no acute distress Psychiatric: Judgment and insight are intact. Affect is appropriate. Breast exam reveals excellent healing of bilateral lumpectomy scars  Lab Findings: Lab Results  Component  Value Date   WBC 7.3 01/26/2020   HGB 14.8 01/26/2020   HCT 45.2 01/26/2020   MCV 96.8 01/26/2020   PLT 264 01/26/2020    @LASTCHEMISTRY @  Radiographic Findings: MM Breast Surgical Specimen  Result Date: 01/29/2020 CLINICAL DATA:  Status post radioactive seed localization of the LEFT breast. EXAM: SPECIMEN RADIOGRAPH OF THE LEFT BREAST COMPARISON:  Previous exam(s). FINDINGS: Status post excision of the left breast. The radioactive seed and ribbon shaped biopsy marker clip are present,  completely intact, and were marked for pathology. IMPRESSION: Specimen radiograph of the LEFT breast. Electronically Signed   By: Nolon Nations M.D.   On: 01/29/2020 10:48   MM Breast Surgical Specimen  Result Date: 01/29/2020 CLINICAL DATA:  Status post radioactive seed localization of the RIGHT breast. EXAM: SPECIMEN RADIOGRAPH OF THE RIGHT BREAST COMPARISON:  Previous exam(s). FINDINGS: Status post excision of the right breast. The radioactive seed and cylinder-shaped biopsy marker clip are present, completely intact, and were marked for pathology. IMPRESSION: Specimen radiograph of the right breast. Electronically Signed   By: Nolon Nations M.D.   On: 01/29/2020 10:22   MM LT RADIOACTIVE SEED LOC MAMMO GUIDE  Result Date: 01/28/2020 CLINICAL DATA:  Radioactive seed localization was requested prior to surgical excision of biopsy-proven low-grade ductal carcinoma in situ partially involving an intraductal papilloma left breast. EXAM: MAMMOGRAPHIC GUIDED RADIOACTIVE SEED LOCALIZATION OF THE LEFT BREAST COMPARISON:  Previous exam(s). FINDINGS: Patient presents for radioactive seed localization prior to lumpectomy. I met with the patient and we discussed the procedure of seed localization including benefits and alternatives. We discussed the high likelihood of a successful procedure. We discussed the risks of the procedure including infection, bleeding, tissue injury and further surgery. We discussed the low dose of radioactivity involved in the procedure. Informed, written consent was given. The usual time-out protocol was performed immediately prior to the procedure. Using mammographic guidance, sterile technique, 1% lidocaine and an I-125 radioactive seed, ribbon shaped biopsy clip in the lower inner quadrant of the left breast was localized using a medial approach. The follow-up mammogram images confirm the seed in the expected location and were marked for Dr. [name]. Follow-up survey of the patient  confirms presence of the radioactive seed. Order number of I-125 seed:  [id number]. Total activity: 0.253 mCi reference Date: 14 January 2020 The patient tolerated the procedure well and was released from the [facility]. She was given instructions regarding seed removal. IMPRESSION: Radioactive seed localization right breast. No apparent complications. Electronically Signed   By: Curlene Dolphin M.D.   On: 01/28/2020 13:49   MM RT RADIOACTIVE SEED LOC MAMMO GUIDE  Result Date: 01/28/2020 CLINICAL DATA:  Radioactive seed localization was requested prior to surgical excision of the right breast. 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 EXAM: MAMMOGRAPHIC GUIDED RADIOACTIVE SEED LOCALIZATION OF THE RIGHT BREAST COMPARISON:  Previous exam(s). FINDINGS: Patient presents for radioactive seed localization prior to excision. I met with the patient and we discussed the procedure of seed localization including benefits and alternatives. We discussed the high likelihood of a successful procedure. We discussed the risks of the procedure including infection, bleeding, tissue injury and further surgery. We discussed the low dose of radioactivity involved in the procedure. Informed, written consent was given. The usual time-out protocol was performed immediately prior to the procedure. Using mammographic guidance, sterile technique, 1% lidocaine and an I-125 radioactive seed, cylinder clip in the retroareolar  right breast was localized using a lateral approach. The follow-up mammogram images confirm the seed in the expected location and were marked for Dr. [name]. Follow-up survey of the patient confirms presence of the radioactive seed. Order number of I-125 seed:  [id number]. Total activity: 0.253 mCi reference Date: 14 January 2020 The patient tolerated the procedure well and was released from the [facility]. She was given  instructions regarding seed removal. IMPRESSION: Radioactive seed localization right breast. No apparent complications. Electronically Signed   By: Curlene Dolphin M.D.   On: 01/28/2020 13:47    Impression/Plan: We discussed adjuvant radiotherapy today.  I recommend radiotherapy to the left breast in order to reduce risk of local recurrence by half.  I reviewed the logistics, benefits, risks, and potential side effects of this treatment in detail. Risks may include but not necessary be limited to acute and late injury tissue in the radiation fields such as skin irritation (change in color/pigmentation, itching, dryness, pain, peeling). She may experience fatigue. We also discussed possible risk of long term cosmetic changes or scar tissue. There is also a smaller risk for lung toxicity,  cardiac toxicity, lymphedema, musculoskeletal changes, rib fragility or late chronic non-healing soft tissue wound.  We discussed the technology that we use to reduce risk of late effects.  The patient asked good questions which I answered to her satisfaction. She is enthusiastic about proceeding with treatment. A consent form has been signed and placed in her chart.  We discussed measures to reduce the risk of infection during the COVID-19 pandemic.  We discussed the importance of vaccination to reduce the risk of morbidity from disease.  She is vaccinated. We discussed the importance of social distancing, reducing contacts, and masking.  During the consultation the patient received a call from her friend regarding her friend's recent positive COVID-19 test.  As they have recently been in close contact, the patient will get tested.  Nursing will help her arrange that appointment.  We have scheduled her for CT simulation early next week, but this can certainly be moved back if she ends up testing positive for COVID-19.  On date of service, in total, I spent 40 minutes on this encounter. Patient was seen in person.    _____________________________________   Eppie Gibson, MD

## 2020-02-24 ENCOUNTER — Encounter: Payer: Self-pay | Admitting: Licensed Clinical Social Worker

## 2020-02-24 ENCOUNTER — Encounter: Payer: Self-pay | Admitting: *Deleted

## 2020-02-24 NOTE — Progress Notes (Signed)
[facility] Psychosocial Distress Screening Clinical Social Work  Clinical Social Work was referred by distress screening protocol.  The patient scored a 6 on the Psychosocial Distress Thermometer which indicates moderate distress. Clinical Social Worker contacted patient by phone to assess for distress and other psychosocial needs.   Patient reports feeling better after meeting with Dr. Isidore Moos yesterday. She has had good support throughout treatment. Started back at work today (at the [employer]) which has been stressful, but is going well. Patient reports that the last year has brought numerous stressors for her family but things are improving now. CSW explained support programs and availability to connect to counseling in the community if desired.   ON[facility] DISTRESS SCREENING 02/23/2020  Screening Type Initial Screening  Distress experienced in past week (1-10) 6  Family Problem type Other (comment)  Emotional problem type Depression;Nervousness/Anxiety  Physical Problem type Tingling hands/feet  Physician notified of physical symptoms Yes  Referral to clinical psychology No  Referral to clinical social work Yes  Referral to dietition No  Referral to financial advocate Yes  Referral to support programs Yes  Referral to palliative care No    Clinical Social Worker follow up needed: No.  If yes, follow up plan:  Tashiya Souders, [place], LCSW

## 2020-02-25 ENCOUNTER — Other Ambulatory Visit: Payer: Self-pay

## 2020-02-25 ENCOUNTER — Other Ambulatory Visit: Payer: BC Managed Care – PPO

## 2020-02-25 DIAGNOSIS — Z20822 Contact with and (suspected) exposure to covid-19: Secondary | ICD-10-CM

## 2020-02-26 ENCOUNTER — Ambulatory Visit: Payer: BC Managed Care – PPO | Admitting: Radiation Oncology

## 2020-02-26 LAB — SARS-COV-2, NAA 2 DAY TAT

## 2020-02-26 LAB — NOVEL CORONAVIRUS, NAA: SARS-CoV-2, NAA: NOT DETECTED

## 2020-03-01 ENCOUNTER — Ambulatory Visit
Admission: RE | Admit: 2020-03-01 | Discharge: 2020-03-01 | Disposition: A | Discharge status: Home / Self Care | Payer: BC Managed Care – PPO | Source: Ambulatory Visit | Attending: Radiation Oncology | Admitting: Radiation Oncology

## 2020-03-01 ENCOUNTER — Other Ambulatory Visit: Payer: Self-pay

## 2020-03-01 DIAGNOSIS — Z510 Encounter for antineoplastic radiation therapy: Secondary | ICD-10-CM | POA: Diagnosis not present

## 2020-03-02 DIAGNOSIS — Z510 Encounter for antineoplastic radiation therapy: Secondary | ICD-10-CM | POA: Diagnosis not present

## 2020-03-07 ENCOUNTER — Encounter: Payer: Self-pay | Admitting: *Deleted

## 2020-03-07 ENCOUNTER — Other Ambulatory Visit: Payer: Self-pay

## 2020-03-07 ENCOUNTER — Ambulatory Visit
Admission: RE | Admit: 2020-03-07 | Discharge: 2020-03-07 | Disposition: A | Discharge status: Home / Self Care | Payer: BC Managed Care – PPO | Source: Ambulatory Visit | Attending: Radiation Oncology | Admitting: Radiation Oncology

## 2020-03-07 DIAGNOSIS — D0512 Intraductal carcinoma in situ of left breast: Secondary | ICD-10-CM

## 2020-03-07 DIAGNOSIS — Z510 Encounter for antineoplastic radiation therapy: Secondary | ICD-10-CM | POA: Diagnosis not present

## 2020-03-07 MED ORDER — RADIAPLEXRX EX GEL: Freq: Once | CUTANEOUS | Status: AC

## 2020-03-07 MED ORDER — ALRA NON-METALLIC DEODORANT (RAD-ONC)
1.0000 "application " | Freq: Once | TOPICAL | Status: AC
  Administered 2020-03-07: 1 via TOPICAL

## 2020-03-07 NOTE — Progress Notes (Signed)

## 2020-03-08 ENCOUNTER — Ambulatory Visit
Admission: RE | Admit: 2020-03-08 | Discharge: 2020-03-08 | Disposition: A | Discharge status: Home / Self Care | Payer: BC Managed Care – PPO | Source: Ambulatory Visit | Attending: Radiation Oncology | Admitting: Radiation Oncology

## 2020-03-08 ENCOUNTER — Other Ambulatory Visit: Payer: Self-pay

## 2020-03-08 DIAGNOSIS — Z510 Encounter for antineoplastic radiation therapy: Secondary | ICD-10-CM | POA: Diagnosis not present

## 2020-03-09 ENCOUNTER — Ambulatory Visit
Admission: RE | Admit: 2020-03-09 | Discharge: 2020-03-09 | Disposition: A | Discharge status: Home / Self Care | Payer: BC Managed Care – PPO | Source: Ambulatory Visit | Attending: Radiation Oncology | Admitting: Radiation Oncology

## 2020-03-09 DIAGNOSIS — Z170 Estrogen receptor positive status [ER+]: Secondary | ICD-10-CM | POA: Insufficient documentation

## 2020-03-09 DIAGNOSIS — Z510 Encounter for antineoplastic radiation therapy: Secondary | ICD-10-CM | POA: Insufficient documentation

## 2020-03-09 DIAGNOSIS — D0512 Intraductal carcinoma in situ of left breast: Secondary | ICD-10-CM | POA: Diagnosis not present

## 2020-03-10 ENCOUNTER — Ambulatory Visit
Admission: RE | Admit: 2020-03-10 | Discharge: 2020-03-10 | Disposition: A | Discharge status: Home / Self Care | Payer: BC Managed Care – PPO | Source: Ambulatory Visit | Attending: Radiation Oncology | Admitting: Radiation Oncology

## 2020-03-10 ENCOUNTER — Other Ambulatory Visit: Payer: Self-pay

## 2020-03-10 DIAGNOSIS — Z510 Encounter for antineoplastic radiation therapy: Secondary | ICD-10-CM | POA: Diagnosis not present

## 2020-03-11 ENCOUNTER — Other Ambulatory Visit: Payer: Self-pay

## 2020-03-11 ENCOUNTER — Ambulatory Visit
Admission: RE | Admit: 2020-03-11 | Discharge: 2020-03-11 | Disposition: A | Discharge status: Home / Self Care | Payer: BC Managed Care – PPO | Source: Ambulatory Visit | Attending: Radiation Oncology | Admitting: Radiation Oncology

## 2020-03-11 DIAGNOSIS — Z510 Encounter for antineoplastic radiation therapy: Secondary | ICD-10-CM | POA: Diagnosis not present

## 2020-03-15 ENCOUNTER — Other Ambulatory Visit: Payer: Self-pay

## 2020-03-15 ENCOUNTER — Ambulatory Visit
Admission: RE | Admit: 2020-03-15 | Discharge: 2020-03-15 | Disposition: A | Discharge status: Home / Self Care | Payer: BC Managed Care – PPO | Source: Ambulatory Visit | Attending: Radiation Oncology | Admitting: Radiation Oncology

## 2020-03-15 DIAGNOSIS — D0512 Intraductal carcinoma in situ of left breast: Secondary | ICD-10-CM

## 2020-03-15 DIAGNOSIS — Z510 Encounter for antineoplastic radiation therapy: Secondary | ICD-10-CM | POA: Diagnosis not present

## 2020-03-16 ENCOUNTER — Encounter: Payer: Self-pay | Admitting: *Deleted

## 2020-03-16 ENCOUNTER — Ambulatory Visit
Admission: RE | Admit: 2020-03-16 | Discharge: 2020-03-16 | Disposition: A | Discharge status: Home / Self Care | Payer: BC Managed Care – PPO | Source: Ambulatory Visit | Attending: Radiation Oncology | Admitting: Radiation Oncology

## 2020-03-16 DIAGNOSIS — Z510 Encounter for antineoplastic radiation therapy: Secondary | ICD-10-CM | POA: Diagnosis not present

## 2020-03-17 ENCOUNTER — Ambulatory Visit
Admission: RE | Admit: 2020-03-17 | Discharge: 2020-03-17 | Disposition: A | Discharge status: Home / Self Care | Payer: BC Managed Care – PPO | Source: Ambulatory Visit | Attending: Radiation Oncology | Admitting: Radiation Oncology

## 2020-03-17 DIAGNOSIS — Z510 Encounter for antineoplastic radiation therapy: Secondary | ICD-10-CM | POA: Diagnosis not present

## 2020-03-18 ENCOUNTER — Ambulatory Visit
Admission: RE | Admit: 2020-03-18 | Discharge: 2020-03-18 | Disposition: A | Discharge status: Home / Self Care | Payer: BC Managed Care – PPO | Source: Ambulatory Visit | Attending: Radiation Oncology | Admitting: Radiation Oncology

## 2020-03-18 DIAGNOSIS — Z510 Encounter for antineoplastic radiation therapy: Secondary | ICD-10-CM | POA: Diagnosis not present

## 2020-03-21 ENCOUNTER — Ambulatory Visit
Admission: RE | Admit: 2020-03-21 | Discharge: 2020-03-21 | Disposition: A | Discharge status: Home / Self Care | Payer: BC Managed Care – PPO | Source: Ambulatory Visit | Attending: Radiation Oncology | Admitting: Radiation Oncology

## 2020-03-21 DIAGNOSIS — Z510 Encounter for antineoplastic radiation therapy: Secondary | ICD-10-CM | POA: Diagnosis not present

## 2020-03-22 ENCOUNTER — Ambulatory Visit
Admission: RE | Admit: 2020-03-22 | Discharge: 2020-03-22 | Disposition: A | Discharge status: Home / Self Care | Payer: BC Managed Care – PPO | Source: Ambulatory Visit | Attending: Radiation Oncology | Admitting: Radiation Oncology

## 2020-03-22 DIAGNOSIS — Z510 Encounter for antineoplastic radiation therapy: Secondary | ICD-10-CM | POA: Diagnosis not present

## 2020-03-23 ENCOUNTER — Ambulatory Visit: Payer: BC Managed Care – PPO

## 2020-03-23 ENCOUNTER — Telehealth: Payer: Self-pay

## 2020-03-23 NOTE — Telephone Encounter (Signed)
Patient called and left a VM saying her daughter tested positive for COVID and she wanted to know what her next steps needed to be to resume XRT treatment. Clarified with Waymond Cera that patient needs to also be tested 5 days after known contact with positive exposure person. If patient's test is negative, she can resume XRT immediately.  Returned patient's and received clarification that her daughter is 14 (un-vaccinated), and tested positive via rapid-pcr at her pediatrician's office today 03/23/20. Advised patient that she will have to miss the rest of her XRT for the week, and ideally needs to try and get an appointment for a rapid-pcr test Monday morning 03/28/20 so she can call us with the results. If her test is negative, she will be able to resume XRT that afternoon at her usual appointment time. The appointments she will have to miss this week will be added onto the end of her schedule.   Patient verbalized understanding and agreement of plan, and denied any other needs at this time. She knows to call me back directly should she have any other questions/concerns.

## 2020-03-24 ENCOUNTER — Ambulatory Visit: Payer: BC Managed Care – PPO

## 2020-03-25 ENCOUNTER — Ambulatory Visit
Admission: RE | Admit: 2020-03-25 | Discharge: 2020-03-25 | Disposition: A | Discharge status: Home / Self Care | Payer: BC Managed Care – PPO | Source: Ambulatory Visit | Attending: Radiation Oncology | Admitting: Radiation Oncology

## 2020-03-25 ENCOUNTER — Ambulatory Visit: Payer: BC Managed Care – PPO

## 2020-03-25 ENCOUNTER — Telehealth: Payer: Self-pay

## 2020-03-25 DIAGNOSIS — Z510 Encounter for antineoplastic radiation therapy: Secondary | ICD-10-CM | POA: Diagnosis not present

## 2020-03-25 NOTE — Telephone Encounter (Signed)
Called patient to check on her and see if she was symptomatic. She stated she was felling well, so I informed her that Dr. Isidore Moos would like her to come in for XRT today. Advised her that she will need to double mask and enter the cancer center through the back entrance via the healing garden. Provided therapist's number ([phone]) for her to call when she arrives so she can be escorted back to machine. Reminded that she will still need to get tested for COVID-19 this coming Monday 03/28/20, and to call us with the results. Patient verbalized understanding and agreement with plan, and denied any other needs at this time. She has my office number and the therapist's direct number should she have any questions/concerns.

## 2020-03-28 ENCOUNTER — Ambulatory Visit
Admission: RE | Admit: 2020-03-28 | Discharge: 2020-03-28 | Disposition: A | Discharge status: Home / Self Care | Payer: BC Managed Care – PPO | Source: Ambulatory Visit | Attending: Radiation Oncology | Admitting: Radiation Oncology

## 2020-03-28 ENCOUNTER — Encounter: Payer: Self-pay | Admitting: *Deleted

## 2020-03-28 DIAGNOSIS — Z510 Encounter for antineoplastic radiation therapy: Secondary | ICD-10-CM | POA: Diagnosis not present

## 2020-03-29 ENCOUNTER — Telehealth: Payer: Self-pay | Admitting: Radiation Oncology

## 2020-03-29 ENCOUNTER — Other Ambulatory Visit: Payer: BC Managed Care – PPO

## 2020-03-29 ENCOUNTER — Ambulatory Visit: Payer: BC Managed Care – PPO

## 2020-03-29 ENCOUNTER — Other Ambulatory Visit: Payer: Self-pay | Admitting: Critical Care Medicine

## 2020-03-29 DIAGNOSIS — R6889 Other general symptoms and signs: Secondary | ICD-10-CM

## 2020-03-29 NOTE — Telephone Encounter (Signed)
Phoned patient as requested by Althia Forts, RN. Patient reports she has not been able to be tested for COVID yet. She explains she attempted to yesterday but after waiting 2 hours she had to leave. She verbalizes her intention to "go through one of those drive through places tomorrow." Patient states, "I won't be in for treatment today because my daughter doesn't feel well." Informed [place], RT on L3 of this findings and Therapist, nutritional, Therapist, sports.

## 2020-03-30 ENCOUNTER — Ambulatory Visit: Payer: BC Managed Care – PPO | Admitting: Rehabilitation

## 2020-03-30 ENCOUNTER — Ambulatory Visit
Admission: RE | Admit: 2020-03-30 | Discharge: 2020-03-30 | Disposition: A | Discharge status: Home / Self Care | Payer: BC Managed Care – PPO | Source: Ambulatory Visit | Attending: Radiation Oncology | Admitting: Radiation Oncology

## 2020-03-30 DIAGNOSIS — Z510 Encounter for antineoplastic radiation therapy: Secondary | ICD-10-CM | POA: Diagnosis not present

## 2020-03-31 ENCOUNTER — Telehealth: Payer: Self-pay

## 2020-03-31 ENCOUNTER — Ambulatory Visit
Admission: RE | Admit: 2020-03-31 | Discharge: 2020-03-31 | Disposition: A | Discharge status: Home / Self Care | Payer: BC Managed Care – PPO | Source: Ambulatory Visit | Attending: Radiation Oncology | Admitting: Radiation Oncology

## 2020-03-31 ENCOUNTER — Other Ambulatory Visit: Payer: Self-pay

## 2020-03-31 ENCOUNTER — Ambulatory Visit: Payer: BC Managed Care – PPO

## 2020-03-31 DIAGNOSIS — Z510 Encounter for antineoplastic radiation therapy: Secondary | ICD-10-CM | POA: Diagnosis not present

## 2020-03-31 LAB — SARS-COV-2, NAA 2 DAY TAT

## 2020-03-31 LAB — SPECIMEN STATUS REPORT

## 2020-03-31 LAB — NOVEL CORONAVIRUS, NAA: SARS-CoV-2, NAA: NOT DETECTED

## 2020-03-31 NOTE — Telephone Encounter (Signed)
Received VM from patient that her recent COVID-19 test was negative. She wanted to know if her appointment time would stay at the end of the day, or if she could come in earlier. Information passed along to radiation therapists on L3 who informed me they would call patient with updated appointment/schedule. No other needs identified at this time.

## 2020-04-01 ENCOUNTER — Ambulatory Visit: Payer: BC Managed Care – PPO

## 2020-04-01 ENCOUNTER — Encounter: Payer: Self-pay | Admitting: Radiation Oncology

## 2020-04-01 ENCOUNTER — Ambulatory Visit
Admission: RE | Admit: 2020-04-01 | Discharge: 2020-04-01 | Disposition: A | Discharge status: Home / Self Care | Payer: BC Managed Care – PPO | Source: Ambulatory Visit | Attending: Radiation Oncology | Admitting: Radiation Oncology

## 2020-04-01 DIAGNOSIS — Z510 Encounter for antineoplastic radiation therapy: Secondary | ICD-10-CM | POA: Diagnosis not present

## 2020-04-04 ENCOUNTER — Ambulatory Visit: Admission: RE | Admit: 2020-04-04 | Payer: BC Managed Care – PPO | Source: Ambulatory Visit

## 2020-04-04 ENCOUNTER — Other Ambulatory Visit: Payer: Self-pay

## 2020-04-04 ENCOUNTER — Ambulatory Visit: Payer: BC Managed Care – PPO | Admitting: Diagnostic Neuroimaging

## 2020-04-04 ENCOUNTER — Encounter: Payer: Self-pay | Admitting: *Deleted

## 2020-04-04 ENCOUNTER — Ambulatory Visit: Payer: BC Managed Care – PPO

## 2020-04-04 NOTE — Progress Notes (Signed)
Patient Care Team: Celene Squibb, MD as PCP - General (Internal Medicine) Satira Sark, MD as PCP - Cardiology (Cardiology) Mauro Kaufmann, RN as Oncology Nurse Navigator Rockwell Germany, RN as Oncology Nurse Navigator Jovita Kussmaul, MD as Surgeon (General Surgery) Nicholas Lose, MD as Medical Oncologist (Hematology and Oncology) Eppie Gibson, MD as Radiation Oncologist (Radiation Oncology)  DIAGNOSIS:    ICD-10-CM   1. Ductal carcinoma in situ (DCIS) of left breast  D05.12     SUMMARY OF ONCOLOGIC HISTORY: Oncology History  Ductal carcinoma in situ (DCIS) of left breast  11/04/2019 Initial Diagnosis   Screening mammogram on 10/12/19 detected a possible left breast mass. Diagnostic mammogram and [place] on 10/27/19 showed a 0.6cm mass at the 8:30 position in the left breast. Biopsy on 11/04/19 showed ductal carcinoma in situ with calcifications partially involving an intraductal papilloma, low grade, ER/PR+ 100%.    01/29/2020 Surgery   Bilateral lumpectomies [name]): left breast: low grade DCIS involving papilloma, clear margins; right breast: no evidence of carcinoma   02/01/2020 Cancer Staging   Staging form: Breast, AJCC 8th Edition - Pathologic stage from 02/01/2020: Stage 0 (pTis (DCIS), pN0, cM0, ER+, PR+) - Signed by Gardenia Phlegm, NP on 02/17/2020   03/08/2020 -  Radiation Therapy   Adjuvant radiation     CHIEF COMPLIANT: Follow-up to discuss antiestrogen therapy  INTERVAL HISTORY: Pamela Martinez is a 49 y.o. with above-mentioned history of left breast DCIS who underwent bilateral lumpectomies and is currently undergoing radiation treatment. She presents to the clinic today to discuss antiestrogen therapy.  She had done extremely well from radiation standpoint with mild radiation dermatitis.  ALLERGIES:  is allergic to diflucan [fluconazole] and sulfa antibiotics.  MEDICATIONS:  Current Outpatient Medications  Medication Sig Dispense Refill  .  ALPRAZolam (XANAX) 0.5 MG tablet Take 0.5 mg by mouth at bedtime as needed for anxiety.    . cetirizine (ZYRTEC) 10 MG tablet Take 10 mg by mouth daily.    . Cholecalciferol (VITAMIN D) 50 MCG (2000 UT) tablet Take 2,000 Units by mouth daily.     Marland Kitchen MAGNESIUM PO Take 400 mg by mouth daily.     . Multiple Vitamins-Minerals (MULTIVITAMIN WITH MINERALS) tablet Take 1 tablet by mouth daily.    . sertraline (ZOLOFT) 50 MG tablet Take 50 mg by mouth daily.    . tamoxifen (NOLVADEX) 20 MG tablet Take 1 tablet (20 mg total) by mouth daily. 90 tablet 3  . thiamine (VITAMIN B-1) 50 MG tablet Take 50 mg by mouth daily.     Current Facility-Administered Medications  Medication Dose Route Frequency Provider Last Rate Last Admin  . levonorgestrel (MIRENA) 20 MCG/24HR IUD   Intrauterine Once Fontaine, Belinda Block, MD        PHYSICAL EXAMINATION: ECOG PERFORMANCE STATUS: 1 - Symptomatic but completely ambulatory  Vitals:   04/05/20 1432  BP: 122/87  Pulse: 88  Resp: 17  Temp: 98 F (36.7 C)  SpO2: 98%   Filed Weights   04/05/20 1432  Weight: 186 lb 14.4 oz (84.8 kg)    LABORATORY DATA:  I have reviewed the data as listed CMP Latest Ref Rng & Units 09/10/2018 09/02/2017 08/24/2016  Glucose 65 - 99 mg/dL 78 84 79  BUN 7 - 25 mg/dL 15 16 15   Creatinine 0.50 - 1.05 mg/dL 0.68 0.81 0.87  Sodium 135 - 146 mmol/L 139 137 139  Potassium 3.5 - 5.3 mmol/L 3.7 3.5 3.5  Chloride  98 - 110 mmol/L 105 106 107  CO2 20 - 32 mmol/L 26 24 20   Calcium 8.6 - 10.4 mg/dL 9.4 9.2 9.3  Total Protein 6.1 - 8.1 g/dL 6.4 6.6 6.6  Total Bilirubin 0.2 - 1.2 mg/dL 0.8 0.8 0.7  Alkaline Phos 33 - 115 U/L - - 63  AST 10 - 35 U/L 15 19 19   ALT 6 - 29 U/L 23 35(H) 26    Lab Results  Component Value Date   WBC 7.3 01/26/2020   HGB 14.8 01/26/2020   HCT 45.2 01/26/2020   MCV 96.8 01/26/2020   PLT 264 01/26/2020   NEUTROABS 6,014 09/10/2018    ASSESSMENT & PLAN:  Ductal carcinoma in situ (DCIS) of left  breast 01/29/20: Bilateral lumpectomies [name]):  Left breast: low grade DCIS involving papilloma, clear margins ER/PR 100%;  Right breast: no evidence of carcinoma  Treatment plan: 1.  Adjuvant radiation therapy 03/08/2020-04/05/2020 2. followed by adjuvant antiestrogen therapy with tamoxifen x5 years  Tamoxifen counseling:We discussed the risks and benefits of tamoxifen. These include but not limited to insomnia, hot flashes, mood changes, vaginal dryness, and weight gain. Although rare, serious side effects including endometrial cancer, risk of blood clots were also discussed. We strongly believe that the benefits far outweigh the risks. Patient understands these risks and consented to starting treatment. Planned treatment duration is 5 years.  Return to clinic in 3 months for survivorship care plan visit    No orders of the defined types were placed in this encounter.  The patient has a good understanding of the overall plan. she agrees with it. she will call with any problems that may develop before the next visit here.  Total time spent: 30 mins including face to face time and time spent for planning, charting and coordination of care  Nicholas Lose, MD 04/05/2020  I, Cloyde Reams Dorshimer, am acting as scribe for Dr. Nicholas Lose.  I have reviewed the above documentation for accuracy and completeness, and I agree with the above.

## 2020-04-05 ENCOUNTER — Inpatient Hospital Stay: Payer: BC Managed Care – PPO | Attending: Hematology and Oncology | Admitting: Hematology and Oncology

## 2020-04-05 ENCOUNTER — Other Ambulatory Visit: Payer: Self-pay

## 2020-04-05 ENCOUNTER — Encounter: Payer: Self-pay | Admitting: Rehabilitation

## 2020-04-05 ENCOUNTER — Ambulatory Visit: Payer: BC Managed Care – PPO

## 2020-04-05 ENCOUNTER — Ambulatory Visit: Payer: BC Managed Care – PPO | Attending: Radiation Oncology | Admitting: Rehabilitation

## 2020-04-05 DIAGNOSIS — Z923 Personal history of irradiation: Secondary | ICD-10-CM | POA: Diagnosis not present

## 2020-04-05 DIAGNOSIS — D0512 Intraductal carcinoma in situ of left breast: Secondary | ICD-10-CM | POA: Diagnosis present

## 2020-04-05 DIAGNOSIS — Z483 Aftercare following surgery for neoplasm: Secondary | ICD-10-CM

## 2020-04-05 DIAGNOSIS — M25611 Stiffness of right shoulder, not elsewhere classified: Secondary | ICD-10-CM | POA: Diagnosis present

## 2020-04-05 DIAGNOSIS — M25612 Stiffness of left shoulder, not elsewhere classified: Secondary | ICD-10-CM | POA: Diagnosis present

## 2020-04-05 DIAGNOSIS — Z170 Estrogen receptor positive status [ER+]: Secondary | ICD-10-CM | POA: Insufficient documentation

## 2020-04-05 DIAGNOSIS — M542 Cervicalgia: Secondary | ICD-10-CM | POA: Insufficient documentation

## 2020-04-05 MED ORDER — TAMOXIFEN CITRATE 20 MG PO TABS: 20.0000 mg | ORAL_TABLET | Freq: Every day | ORAL | 3 refills | Status: DC

## 2020-04-05 NOTE — Therapy (Signed)
[place], [place], [address] Phone: [phone]   Fax:  [phone]  Physical Therapy Evaluation  Patient Details  Name: Pamela Martinez MRN: [id number] Date of Birth: 06/26/1971 Referring Provider (PT): Dr. Isidore Moos   Encounter Date: 04/05/2020   PT End of Session - 04/05/20 1705    Visit Number 1    Number of Visits 13    Date for PT Re-Evaluation 05/17/20    Authorization Type BCBS    PT Start Time 1600    PT Stop Time 1654    PT Time Calculation (min) 54 min    Activity Tolerance Patient tolerated treatment well    Behavior During Therapy [facility] for tasks assessed/performed           Past Medical History:  Diagnosis Date  . Anxiety   . 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 Asthma    no inhalers  . Ductal carcinoma in situ (DCIS) of left breast   . Joint pain   . Migraine   . Neurocardiogenic syncope   . PONV (postoperative nausea and vomiting)   . Restless leg syndrome     Past Surgical History:  Procedure Laterality Date  . BREAST LUMPECTOMY WITH RADIOACTIVE SEED LOCALIZATION Bilateral 01/29/2020   Procedure: BILATERAL BREAST LUMPECTOMY WITH RADIOACTIVE SEED LOCALIZATION;  Surgeon: Jovita Kussmaul, MD;  Location: [place];  Service: General;  Laterality: Bilateral;  . INTRAUTERINE DEVICE INSERTION  02/10/2016   Mirena  . KNEE SURGERY     X 4 = 1X RIGHT 3X LEFT/3 ARTHROSCOPIES AND 1 ACL  . KNEE SURGERY     ACL  . MM BREAST STEREO BIOPSY LEFT ([place] HX) Left   . WISDOM TOOTH EXTRACTION      There were no vitals filed for this visit.    Subjective Assessment - 04/05/20 1651    Subjective I am having some breast swelling, shoulder pain with resistance, and some neck pain with shooting pains    Pertinent History bilateral lumpectomy by Dr. [name] on 01/29/20 due to left breast  cancer showing DCIS and Rt clear. No lymph nodes removed.  ER/PR positive. Radiation done Friday.    Patient Stated Goals swelling, get the soreness out    Currently in Pain? No/denies    Pain Score 4     Pain Location Breast    Pain Orientation Left    Pain Descriptors / Indicators Aching    Pain Type Surgical pain    Pain Onset More than a month ago    Pain Frequency Intermittent    Multiple Pain Sites Yes    Pain Score 5    Pain Location Shoulder    Pain Orientation Right;Left    Pain Descriptors / Indicators Aching    Pain Type Surgical pain    Pain Onset More than a month ago    Pain Frequency Intermittent              [facility] PT Assessment - 04/05/20 0001      Assessment   Medical Diagnosis left breast cancer    Referring Provider (PT) Dr. Isidore Moos    Onset Date/Surgical Date 01/29/20    Hand Dominance Right    Prior Therapy no      Precautions   Precaution Comments mild lymphedema risk left due to radiation  Restrictions   Weight Bearing Restrictions No      Balance Screen   Has the patient fallen in the past 6 months No    Has the patient had a decrease in activity level because of a fear of falling?  No    Is the patient reluctant to leave their home because of a fear of falling?  No      Home Environment   Living Environment Private residence    Living Arrangements Spouse/significant other;Children      Prior Function   Level of Independence Independent    Vocation Part time employment;Full time employment    Vocation Requirements courthouse full time, hair part time, student partime     Leisure not right now       Cognition   Overall Cognitive Status Within Functional Limits for tasks assessed      Observation/Other Assessments   Observations left breast radiation dermatitis present with itchiness and discomfort per patient     Skin Integrity well healed bilateral lumpectomy incisions right inferior nipple and left inferior breast        Sensation   Additional Comments no numbness or tingling      Coordination   Gross Motor Movements are Fluid and Coordinated Yes      Posture/Postural Control   Posture/Postural Control Postural limitations    Postural Limitations Rounded Shoulders;Forward head      ROM / Strength   AROM / PROM / Strength AROM;Strength;PROM      AROM   AROM Assessment Site Cervical;Shoulder    Right/Left Shoulder Right;Left    Right Shoulder Extension 65 Degrees    Right Shoulder Flexion 155 Degrees    Right Shoulder ABduction 157 Degrees    Right Shoulder Internal Rotation 60 Degrees    Right Shoulder External Rotation 90 Degrees    Left Shoulder Extension 61 Degrees    Left Shoulder Flexion 155 Degrees    Left Shoulder ABduction 164 Degrees    Left Shoulder Internal Rotation 78 Degrees    Left Shoulder External Rotation 85 Degrees    Cervical Flexion 55    Cervical Extension 42    Cervical - Right Side Bend 25    Cervical - Left Side Bend 13    Cervical - Right Rotation 60   pain   Cervical - Left Rotation 55   pain     PROM   PROM Assessment Site Shoulder    Right/Left Shoulder Left    Left Shoulder Flexion 153 Degrees   with pain   Left Shoulder ABduction 165 Degrees    Left Shoulder Internal Rotation --   to belly   Left Shoulder External Rotation 85 Degrees   at 90deg abduction     Strength   Strength Assessment Site Shoulder    Right/Left Shoulder Right;Left    Right Shoulder Flexion 4/5    Right Shoulder ABduction 4-/5    Right Shoulder Internal Rotation 5/5    Right Shoulder External Rotation 4+/5    Left Shoulder Flexion 4-/5    Left Shoulder ABduction 4-/5    Left Shoulder Internal Rotation 5/5    Left Shoulder External Rotation 4+/5      Flexibility   Soft Tissue Assessment /Muscle Length no      Palpation   Palpation comment tenderness left UT, supraspinatus, levator,Lats.  Left breast; fibrosis evident inferior/medial breast but palpation limited by radiation  dermatitis . Some lateral trunk soft edema  LYMPHEDEMA/ONCOLOGY QUESTIONNAIRE - 04/05/20 0001      Lymphedema Assessments   Lymphedema Assessments Upper extremities                   Objective measurements completed on examination: See above findings.       [facility] Adult PT Treatment/Exercise - 04/05/20 0001      Exercises   Exercises Shoulder;Neck      Shoulder Exercises: Supine   Flexion AAROM;Both;5 reps      Manual Therapy   Manual Therapy Soft tissue mobilization;Passive ROM    Soft tissue mobilization to the left upper trapezius, cervical parapsinals, levator scapulae    Passive ROM to the left shoulder into flexion, abduction, and ER as tolerated      Neck Exercises: Stretches   Upper Trapezius Stretch 1 rep;20 seconds    Upper Trapezius Stretch Limitations left x 20seconds with cueing for initial performance    Levator Stretch 1 rep;20 seconds    Levator Stretch Limitations cueing for intial performance                  PT Education - 04/05/20 1704    Education Details POC, initial HEP    Person(s) Educated Patient    Methods Explanation;Demonstration;Tactile cues;Verbal cues;Handout    Comprehension Verbalized understanding;Returned demonstration;Verbal cues required;Tactile cues required            PT Short Term Goals - 04/05/20 1714      PT SHORT TERM GOAL #1   Title Pt will be ind with self MLD for the left breast    Time 3    Period Weeks    Status New             PT Long Term Goals - 04/05/20 1713      PT LONG TERM GOAL #1   Title Pt will improve cervical spine AROM to painfree and equal bilaterally    Time 6    Period Weeks    Status New      PT LONG TERM GOAL #2   Title Pt will report she is able to undress independently due to improved shoulder ROM    Time 6    Period Weeks    Status New      PT LONG TERM GOAL #3   Title Pt will decrease left breast swelling by at least 50% to improve breast  discomfort    Time 6    Period Weeks    Status New                  Plan - 04/05/20 1706    Clinical Impression Statement Pt presents post bilateral lumpectomy and radiation to the left breast with new bilateral shoulder pain and weakness, cervical pain and decrease AROM, and left breast fibrosis and edema.  Pt is currently limited by radiation dermatitis so breast assessment was brief today but will be added to POC.  Pt reports decreased pain in the cervical spine with some soreness post MT.    Examination-Activity Limitations Reach Overhead;Sleep    Stability/Clinical Decision Making Stable/Uncomplicated    Clinical Decision Making Low    Rehab Potential Excellent    PT Frequency 2x / week    PT Duration 6 weeks    PT Treatment/Interventions ADLs/Self Care Home Management;Manual lymph drainage;Patient/family education;Manual techniques;Therapeutic exercise;Taping    PT Next Visit Plan continue STM to the left UT, levator, cervical muscles and stretching, bil shoulder PROM and AAROM activities working  to improve bil shoulder strength and mobility, monitor left breast status to begin MLD and self MLD instruction    PT Home Exercise Plan UT stretch, LS stretch, supine dowel flexion    Recommended Other Services may need bra    Consulted and Agree with Plan of Care Patient           Patient will benefit from skilled therapeutic intervention in order to improve the following deficits and impairments:  Increased edema, Pain, Impaired UE functional use, Postural dysfunction, Decreased range of motion  Visit Diagnosis: Aftercare following surgery for neoplasm  Stiffness of left shoulder, not elsewhere classified  Stiffness of right shoulder, not elsewhere classified  Cervicalgia     Problem List Patient Active Problem List   Diagnosis Date Noted  . Ductal carcinoma in situ (DCIS) of left breast 11/24/2019  . IUD (intrauterine device) in place 02/23/2016  . Mild dysplasia  of cervix 03/17/2012    Stark Bray 04/05/2020, 5:15 PM  [place], [place], [address] Phone: [phone]   Fax:  [phone]  Name: Kinberly Perris MRN: [id number] Date of Birth: 25-Oct-1970

## 2020-04-05 NOTE — Assessment & Plan Note (Signed)
01/29/20: Bilateral lumpectomies [name]):  Left breast: low grade DCIS involving papilloma, clear margins ER/PR 100%;  Right breast: no evidence of carcinoma  Treatment plan: 1.  Adjuvant radiation therapy 03/08/2020-04/05/2020 2. followed by adjuvant antiestrogen therapy with tamoxifen x5 years  Tamoxifen counseling:We discussed the risks and benefits of tamoxifen. These include but not limited to insomnia, hot flashes, mood changes, vaginal dryness, and weight gain. Although rare, serious side effects including endometrial cancer, risk of blood clots were also discussed. We strongly believe that the benefits far outweigh the risks. Patient understands these risks and consented to starting treatment. Planned treatment duration is 5 years.  Return to clinic in 3 months for survivorship care plan visit

## 2020-04-05 NOTE — Patient Instructions (Signed)
Access Code: [place] URL: [email or web address]: 09/28/2021Prepared by: Marcene Brawn TevisExercises  Seated Cervical Sidebending Stretch - 2 x daily - 7 x weekly - 1-3 sets - 3 reps - 20-30 seconds hold  Seated Levator Scapulae Stretch - 2 x daily - 7 x weekly - 1-3 sets - 3 reps - 20-30 seconds hold  Supine Shoulder Flexion AAROM with Dowel - 2 x daily - 7 x weekly - 1-3 sets - 10 reps - 5 seconds hold

## 2020-04-12 ENCOUNTER — Ambulatory Visit: Payer: BC Managed Care – PPO | Attending: Radiation Oncology

## 2020-04-12 ENCOUNTER — Other Ambulatory Visit: Payer: Self-pay

## 2020-04-12 DIAGNOSIS — M542 Cervicalgia: Secondary | ICD-10-CM | POA: Insufficient documentation

## 2020-04-12 DIAGNOSIS — M25611 Stiffness of right shoulder, not elsewhere classified: Secondary | ICD-10-CM | POA: Insufficient documentation

## 2020-04-12 DIAGNOSIS — Z483 Aftercare following surgery for neoplasm: Secondary | ICD-10-CM | POA: Insufficient documentation

## 2020-04-12 DIAGNOSIS — M25612 Stiffness of left shoulder, not elsewhere classified: Secondary | ICD-10-CM | POA: Diagnosis present

## 2020-04-12 NOTE — Therapy (Signed)
[place], [place], [address] Phone: [phone]   Fax:  [phone]  Physical Therapy Treatment  Patient Details  Name: Pamela Martinez MRN: [id number] Date of Birth: [birth year] Referring Provider (PT): Dr. Isidore Moos   Encounter Date: 04/12/2020   PT End of Session - 04/12/20 1613    Visit Number 2    Number of Visits 13    Date for PT Re-Evaluation 05/17/20    Authorization Type BCBS    PT Start Time 1504    PT Stop Time 1550    PT Time Calculation (min) 46 min    Activity Tolerance Patient tolerated treatment well    Behavior During Therapy [facility] for tasks assessed/performed           Past Medical History:  Diagnosis Date  . Anxiety   . 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 Asthma    no inhalers  . Ductal carcinoma in situ (DCIS) of left breast   . Joint pain   . Migraine   . Neurocardiogenic syncope   . PONV (postoperative nausea and vomiting)   . Restless leg syndrome     Past Surgical History:  Procedure Laterality Date  . BREAST LUMPECTOMY WITH RADIOACTIVE SEED LOCALIZATION Bilateral 01/29/2020   Procedure: BILATERAL BREAST LUMPECTOMY WITH RADIOACTIVE SEED LOCALIZATION;  Surgeon: Jovita Kussmaul, MD;  Location: [place];  Service: General;  Laterality: Bilateral;  . INTRAUTERINE DEVICE INSERTION  02/10/2016   Mirena  . KNEE SURGERY     X 4 = 1X RIGHT 3X LEFT/3 ARTHROSCOPIES AND 1 ACL  . KNEE SURGERY     ACL  . MM BREAST STEREO BIOPSY LEFT ([place] HX) Left   . WISDOM TOOTH EXTRACTION      There were no vitals filed for this visit.   Subjective Assessment - 04/12/20 1505    Subjective A little sore the evening I was here and the next day but got better after that.  Breast is still itchy and red.  Have weird pains that shoot through the breast several times/day 5-8 times.  Shoulders still feel limited especially with removing tighter shirts.  Compliant with HEP but forget sometimes with my busy schedule.    Pertinent History bilateral lumpectomy by Dr. [name] on 01/29/20 due to left breast cancer showing DCIS and Rt clear. No lymph nodes removed.  ER/PR positive. Radiation done Friday.    Limitations Lifting;House hold activities    Patient Stated Goals swelling, get the soreness out    Currently in Pain? No/denies    Pain Score 7     Pain Location Breast    Pain Orientation Left;Anterior    Pain Descriptors / Indicators Shooting    Pain Onset More than a month ago    Pain Frequency Intermittent    Pain Descriptors / Indicators Aching    Pain Type Surgical pain    Pain Onset More than a month ago    Pain Frequency Intermittent    Pain Relieving Factors rest                       [facility]patient [facility]  Lymphedema Life Impact Scale Total Score 33.82 %            [facility] Adult PT Treatment/Exercise - 04/12/20 0001  Neck Exercises: Seated   Lateral Flexion --   left UT strech x 3     Shoulder Exercises: ROM/Strengthening   Other ROM/Strengthening Exercises Supine wand flex and scaption 5 ea for ROM      Manual Therapy   Manual Therapy Soft tissue mobilization;Passive ROM    Soft tissue mobilization to the left upper trapezius, cervical parapsinals, levator scapulae    Passive ROM to bilateral shoulder into flexion, abduction, and IR/ER as tolerated                    PT Short Term Goals - 04/05/20 1714      PT SHORT TERM GOAL #1   Title Pt will be ind with self MLD for the left breast    Time 3    Period Weeks    Status New             PT Long Term Goals - 04/05/20 1713      PT LONG TERM GOAL #1   Title Pt will improve cervical spine AROM to painfree and equal bilaterally    Time 6    Period Weeks    Status New      PT LONG TERM GOAL #2   Title  Pt will report she is able to undress independently due to improved shoulder ROM    Time 6    Period Weeks    Status New      PT LONG TERM GOAL #3   Title Pt will decrease left breast swelling by at least 50% to improve breast discomfort    Time 6    Period Weeks    Status New                 Plan - 04/12/20 1613    Clinical Impression Statement Pt requires VC to relax for PROM of bilateral shoulders secondary to guarding.  Pt. felt good after rx and noted visibly improved Bilateral shoulder ROM    Examination-Activity Limitations Reach Overhead;Sleep    Stability/Clinical Decision Making Stable/Uncomplicated    Clinical Decision Making Low    Rehab Potential Excellent    PT Frequency 2x / week    PT Treatment/Interventions ADLs/Self Care Home Management;Manual lymph drainage;Patient/family education;Manual techniques;Therapeutic exercise;Taping    PT Next Visit Plan continue STM to the left UT, levator, cervical muscles and stretching, bil shoulder PROM and AAROM activities working to improve bil shoulder strength and mobility, monitor left breast status to begin MLD and self MLD instruction    PT Home Exercise Plan UT stretch, LS stretch, supine dowel flexion and scaption    Consulted and Agree with Plan of Care Patient           Patient will benefit from skilled therapeutic intervention in order to improve the following deficits and impairments:  Increased edema, Pain, Impaired UE functional use, Postural dysfunction, Decreased range of motion  Visit Diagnosis: Aftercare following surgery for neoplasm  Stiffness of left shoulder, not elsewhere classified  Stiffness of right shoulder, not elsewhere classified  Cervicalgia     Problem List Patient Active Problem List   Diagnosis Date Noted  . Ductal carcinoma in situ (DCIS) of left breast 11/24/2019  . IUD (intrauterine device) in place 02/23/2016  . Mild dysplasia of cervix 03/17/2012    Claris Pong 04/12/2020, 4:22 PM  [place], [place], [address] Phone: [phone]   Fax:  [phone]  Name:  Pamela Martinez MRN: [id number] Date of Birth: 1971-05-07  Cheral Almas, PT 04/12/20 4:24 PM

## 2020-04-12 NOTE — Patient Instructions (Signed)
Instructed in AA shoulder scaption with wand using wider grip than for flexion. Reviewed Left UT stretching

## 2020-04-13 ENCOUNTER — Ambulatory Visit
Admission: EM | Admit: 2020-04-13 | Discharge: 2020-04-13 | Disposition: A | Discharge status: Home / Self Care | Payer: BC Managed Care – PPO | Attending: Emergency Medicine | Admitting: Emergency Medicine

## 2020-04-13 ENCOUNTER — Encounter: Payer: Self-pay | Admitting: Emergency Medicine

## 2020-04-13 DIAGNOSIS — J069 Acute upper respiratory infection, unspecified: Secondary | ICD-10-CM | POA: Diagnosis not present

## 2020-04-13 DIAGNOSIS — Z20822 Contact with and (suspected) exposure to covid-19: Secondary | ICD-10-CM

## 2020-04-13 MED ORDER — MONTELUKAST SODIUM 10 MG PO TABS: 10.0000 mg | ORAL_TABLET | Freq: Every day | ORAL | 0 refills | Status: DC

## 2020-04-13 MED ORDER — BENZONATATE 100 MG PO CAPS: 100.0000 mg | ORAL_CAPSULE | Freq: Three times a day (TID) | ORAL | 0 refills | Status: DC

## 2020-04-13 MED ORDER — ALBUTEROL SULFATE HFA 108 (90 BASE) MCG/ACT IN AERS: 1.0000 | INHALATION_SPRAY | Freq: Four times a day (QID) | RESPIRATORY_TRACT | 0 refills | Status: DC | PRN

## 2020-04-13 NOTE — Discharge Instructions (Signed)
COVID testing ordered.  It will take between 5-7 days for test results.  Someone will contact you regarding abnormal results.    In the meantime: You should remain isolated in your home for 10 days from symptom onset AND greater than 72 hours after symptoms resolution (absence of fever without the use of fever-reducing medication and improvement in respiratory symptoms), whichever is longer Get plenty of rest and push fluids Tessalon perles for cough Use OTC zyrtec for nasal congestion, runny nose, and/or sore throat Use OTC flonase for nasal congestion and runny nose Use medications daily for symptom relief Use OTC medications like ibuprofen or tylenol as needed fever or pain Call or go to the ED if you have any new or worsening symptoms such as fever, worsening cough, shortness of breath, chest tightness, chest pain, turning blue, changes in mental status, etc..Marland Kitchen

## 2020-04-13 NOTE — ED Triage Notes (Signed)
Pt states she has seasonal allergies and asthma. Reports voice is horse, scratchy throat and cough that started yesterday.

## 2020-04-13 NOTE — ED Provider Notes (Signed)
[place]   [id number] 04/13/20 Arrival Time: 0808   CC: COVID symptoms  SUBJECTIVE: History from: patient.  Pamela Martinez is a 49 y.o. female who presents with hoarseness, scratchy throat, and cough x 1 day.  Denies sick exposure to COVID, flu or strep.  Denies alleviating or aggravating factors.  Reports previous symptoms in the past with allergies.   Denies fever, chills, fatigue, SOB, wheezing, chest pain, nausea, changes in bowel or bladder habits.    ROS: As per HPI.  All other pertinent ROS negative.     Past Medical History:  Diagnosis Date  . Anxiety   . 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 Asthma    no inhalers  . Ductal carcinoma in situ (DCIS) of left breast   . Joint pain   . Migraine   . Neurocardiogenic syncope   . PONV (postoperative nausea and vomiting)   . Restless leg syndrome    Past Surgical History:  Procedure Laterality Date  . BREAST LUMPECTOMY WITH RADIOACTIVE SEED LOCALIZATION Bilateral 01/29/2020   Procedure: BILATERAL BREAST LUMPECTOMY WITH RADIOACTIVE SEED LOCALIZATION;  Surgeon: Jovita Kussmaul, MD;  Location: [facility];  Service: General;  Laterality: Bilateral;  . INTRAUTERINE DEVICE INSERTION  02/10/2016   Mirena  . KNEE SURGERY     X 4 = 1X RIGHT 3X LEFT/3 ARTHROSCOPIES AND 1 ACL  . KNEE SURGERY     ACL  . MM BREAST STEREO BIOPSY LEFT ([place] HX) Left   . WISDOM TOOTH EXTRACTION     Allergies  Allergen Reactions  . Diflucan [Fluconazole] Dermatitis  . Sulfa Antibiotics Itching   Current Facility-Administered Medications on File Prior to Encounter  Medication Dose Route Frequency Provider Last Rate Last Admin  . levonorgestrel (MIRENA) 20 MCG/24HR IUD   Intrauterine Once Fontaine, Belinda Block, MD       Current Outpatient Medications on File Prior to Encounter  Medication Sig Dispense Refill  . ALPRAZolam (XANAX) 0.5  MG tablet Take 0.5 mg by mouth at bedtime as needed for anxiety.    . cetirizine (ZYRTEC) 10 MG tablet Take 10 mg by mouth daily.    . Cholecalciferol (VITAMIN D) 50 MCG (2000 UT) tablet Take 2,000 Units by mouth daily.     Marland Kitchen MAGNESIUM PO Take 400 mg by mouth daily.     . Multiple Vitamins-Minerals (MULTIVITAMIN WITH MINERALS) tablet Take 1 tablet by mouth daily.    . sertraline (ZOLOFT) 50 MG tablet Take 50 mg by mouth daily.    . tamoxifen (NOLVADEX) 20 MG tablet Take 1 tablet (20 mg total) by mouth daily. 90 tablet 3  . thiamine (VITAMIN B-1) 50 MG tablet Take 50 mg by mouth daily.    . [DISCONTINUED] levonorgestrel (MIRENA) 20 MCG/24HR IUD 1 each by Intrauterine route once. INSERTED 05-01-06.      Social History   Socioeconomic History  . Marital status: Married    Spouse name: Octavia Bruckner  . Number of children: 2  . Years of education: Not on file  . Highest education level: Associate degree: academic program  Occupational History    Comment: working on [employer]  Tobacco Use  . Smoking status: Former Smoker    Types: Cigarettes    Quit date: 07/09/1988    Years since quitting: 31.7  . Smokeless tobacco: Never Used  Vaping Use  . Vaping Use: Never  used  Substance and Sexual Activity  . Alcohol use: Yes    Comment: 1 time a month  . Drug use: No  . Sexual activity: Yes    Birth control/protection: I.U.D.  Other Topics Concern  . Not on file  Social History Narrative   Lives with husband    caffeine, tea x 1, soda x 1   Social Determinants of Health   Financial Resource Strain:   . Difficulty of Paying Living Expenses: Not on file  Food Insecurity:   . Worried About Charity fundraiser in the Last Year: Not on file  . Ran Out of Food in the Last Year: Not on file  Transportation Needs:   . Lack of Transportation (Medical): Not on file  . Lack of Transportation (Non-Medical): Not on file  Physical Activity:   . Days of Exercise per Week: Not on file  . Minutes of Exercise per  Session: Not on file  Stress:   . Feeling of Stress : Not on file  Social Connections:   . Frequency of Communication with Friends and Family: Not on file  . Frequency of Social Gatherings with Friends and Family: Not on file  . Attends Religious Services: Not on file  . Active Member of Clubs or Organizations: Not on file  . Attends Archivist Meetings: Not on file  . Marital Status: Not on file  Intimate Partner Violence:   . Fear of Current or Ex-Partner: Not on file  . Emotionally Abused: Not on file  . Physically Abused: Not on file  . Sexually Abused: Not on file   Family History  Problem Relation Age of Onset  . COPD Father   . CAD Father   . Cancer Paternal Grandfather        COLON  . Hyperlipidemia Brother   . Hypertension Brother   . Stroke Maternal Grandmother   . Hyperlipidemia Brother   . Cancer Paternal Aunt        PRIMARY PERITONEAL CANCER    OBJECTIVE:  Vitals:   04/13/20 0827  BP: 130/77  Pulse: 76  Resp: 19  Temp: 98.7 F (37.1 C)  TempSrc: Oral  SpO2: 96%  Weight: 180 lb (81.6 kg)  Height: 5\' 4"  (1.626 m)    General appearance: alert; mildly fatigued, nontoxic; speaking in full sentences and tolerating own secretions HEENT: NCAT; Ears: EACs clear, TMs pearly gray; Eyes: PERRL.  EOM grossly intact.Nose: nares patent without rhinorrhea, Throat: oropharynx clear, tonsils non erythematous or enlarged, uvula midline  Neck: supple without LAD Lungs: unlabored respirations, symmetrical air entry; cough: absent; no respiratory distress; CTAB Heart: regular rate and rhythm.  Skin: warm and dry Psychological: alert and cooperative; normal mood and affect   ASSESSMENT & PLAN:  1. Viral URI with cough   2. Suspected COVID-19 virus infection     Meds ordered this encounter  Medications  . benzonatate (TESSALON) 100 MG capsule    Sig: Take 1 capsule (100 mg total) by mouth every 8 (eight) hours.    Dispense:  21 capsule    Refill:  0     Order Specific Question:   Supervising Provider    Answer:   Raylene Everts [[id number]]  . montelukast (SINGULAIR) 10 MG tablet    Sig: Take 1 tablet (10 mg total) by mouth at bedtime.    Dispense:  30 tablet    Refill:  0    Order Specific Question:   Supervising Provider  AnswerRaylene Everts [[id number]]  . albuterol (VENTOLIN HFA) 108 (90 Base) MCG/ACT inhaler    Sig: Inhale 1-2 puffs into the lungs every 6 (six) hours as needed for wheezing or shortness of breath.    Dispense:  18 g    Refill:  0    Order Specific Question:   Supervising Provider    Answer:   Raylene Everts [[id number]]   COVID testing ordered.  It will take between 5-7 days for test results.  Someone will contact you regarding abnormal results.    In the meantime: You should remain isolated in your home for 10 days from symptom onset AND greater than 72 hours after symptoms resolution (absence of fever without the use of fever-reducing medication and improvement in respiratory symptoms), whichever is longer Get plenty of rest and push fluids Tessalon perles for cough Use OTC zyrtec for nasal congestion, runny nose, and/or sore throat Use OTC flonase for nasal congestion and runny nose Use medications daily for symptom relief Use OTC medications like ibuprofen or tylenol as needed fever or pain Call or go to the ED if you have any new or worsening symptoms such as fever, worsening cough, shortness of breath, chest tightness, chest pain, turning blue, changes in mental status, etc...   Reviewed expectations re: course of current medical issues. Questions answered. Outlined signs and symptoms indicating need for more acute intervention. Patient verbalized understanding. After Visit Summary given.         Lestine Box, PA-C 04/13/20 [phone]

## 2020-04-14 ENCOUNTER — Encounter (HOSPITAL_COMMUNITY): Payer: Self-pay | Admitting: *Deleted

## 2020-04-14 ENCOUNTER — Other Ambulatory Visit: Payer: Self-pay

## 2020-04-14 ENCOUNTER — Telehealth: Payer: Self-pay | Admitting: Emergency Medicine

## 2020-04-14 ENCOUNTER — Emergency Department (HOSPITAL_COMMUNITY)
Admission: EM | Admit: 2020-04-14 | Discharge: 2020-04-14 | Disposition: A | Discharge status: Home / Self Care | Payer: BC Managed Care – PPO | Attending: Emergency Medicine | Admitting: Emergency Medicine

## 2020-04-14 ENCOUNTER — Emergency Department (HOSPITAL_COMMUNITY): Payer: BC Managed Care – PPO

## 2020-04-14 DIAGNOSIS — Z20822 Contact with and (suspected) exposure to covid-19: Secondary | ICD-10-CM | POA: Diagnosis not present

## 2020-04-14 DIAGNOSIS — J45909 Unspecified asthma, uncomplicated: Secondary | ICD-10-CM | POA: Diagnosis not present

## 2020-04-14 DIAGNOSIS — R0602 Shortness of breath: Secondary | ICD-10-CM | POA: Diagnosis present

## 2020-04-14 DIAGNOSIS — Z87891 Personal history of nicotine dependence: Secondary | ICD-10-CM | POA: Insufficient documentation

## 2020-04-14 DIAGNOSIS — J069 Acute upper respiratory infection, unspecified: Secondary | ICD-10-CM | POA: Insufficient documentation

## 2020-04-14 LAB — CBC WITH DIFFERENTIAL/PLATELET
Abs Immature Granulocytes: 0.1 10*3/uL — ABNORMAL HIGH (ref 0.00–0.07)
Basophils Absolute: 0.1 10*3/uL (ref 0.0–0.1)
Basophils Relative: 1 %
Eosinophils Absolute: 0.1 10*3/uL (ref 0.0–0.5)
Eosinophils Relative: 1 %
HCT: 41.4 % (ref 36.0–46.0)
Hemoglobin: 14.1 g/dL (ref 12.0–15.0)
Immature Granulocytes: 1 %
Lymphocytes Relative: 8 %
Lymphs Abs: 1.1 10*3/uL (ref 0.7–4.0)
MCH: 32.7 pg (ref 26.0–34.0)
MCHC: 34.1 g/dL (ref 30.0–36.0)
MCV: 96.1 fL (ref 80.0–100.0)
Monocytes Absolute: 1.8 10*3/uL — ABNORMAL HIGH (ref 0.1–1.0)
Monocytes Relative: 13 %
Neutro Abs: 10.9 10*3/uL — ABNORMAL HIGH (ref 1.7–7.7)
Neutrophils Relative %: 76 %
Platelets: 219 10*3/uL (ref 150–400)
RBC: 4.31 MIL/uL (ref 3.87–5.11)
RDW: 12.6 % (ref 11.5–15.5)
WBC: 14.1 10*3/uL — ABNORMAL HIGH (ref 4.0–10.5)
nRBC: 0 % (ref 0.0–0.2)

## 2020-04-14 LAB — BASIC METABOLIC PANEL
Anion gap: 8 (ref 5–15)
BUN: 12 mg/dL (ref 6–20)
CO2: 23 mmol/L (ref 22–32)
Calcium: 9.4 mg/dL (ref 8.9–10.3)
Chloride: 106 mmol/L (ref 98–111)
Creatinine, Ser: 0.66 mg/dL (ref 0.44–1.00)
GFR calc non Af Amer: >60 mL/min (ref >60)
Glucose, Bld: 94 mg/dL (ref 70–99)
Potassium: 3.6 mmol/L (ref 3.5–5.1)
Sodium: 137 mmol/L (ref 135–145)

## 2020-04-14 LAB — RESP PANEL BY RT PCR (RSV, FLU A&B, COVID)
Influenza A by PCR: NEGATIVE
Influenza B by PCR: NEGATIVE
Respiratory Syncytial Virus by PCR: NEGATIVE
SARS Coronavirus 2 by RT PCR: NEGATIVE

## 2020-04-14 LAB — NOVEL CORONAVIRUS, NAA: SARS-CoV-2, NAA: NOT DETECTED

## 2020-04-14 LAB — SARS-COV-2, NAA 2 DAY TAT

## 2020-04-14 NOTE — ED Triage Notes (Signed)
Pt seen at urgent Care yesterday.  covid test is still pending results, last night began to have a fever highest at 101.5 and N/V.  Productive cough and brown in color. Alternating tylenol and ibuprofen at home.

## 2020-04-14 NOTE — Telephone Encounter (Signed)
Spoke with patient's husband.  Patient experienced and episode of SOB with pulse ox of 91% at home with ambulation.  EMS called.  Stated it would be an 8 hour wait in the ED.  I advised patient's husband to take patient to ED for new symptoms of SOB and low oxygen. COVID test pending. Unable to do breathing treatment in Urgent care setting.  Patient husband aware and in agreement with plan.

## 2020-04-14 NOTE — ED Provider Notes (Signed)
[facility] EMERGENCY DEPARTMENT Provider Note   CSN: [id number] Arrival date & time: 04/14/20  1408     History Chief Complaint  Patient presents with  . Shortness of Breath    Pamela Martinez is a 49 y.o. female.  HPI   Patient with significant medical history of anxiety, asthma, DCIS finished radiation treatment and starting tamoxifen in 2 weeks, presents to the emergency department with chief complaint of fever, chills, cough, nausea, vomiting, diarrhea general malaise.  Patient states the symptoms started on Tuesday and have progressively gotten worse.  Patient states she has had fever as high as 101.5, developed a productive cough, has had a few episodes of vomiting and diarrhea.  She denies becoming short of breath, chest pain, hematuria, blood in her emesis, or bloody stools.  Patient states she has been able to hold food and water down and that the nausea has since resolved.  She states she is currently Covid vaccinated but does endorse that her daughter was Covid +2 weeks ago.  Patient states she was seen in urgent care yesterday where she had a Covid test performed but will not be back in 5 days. she is requesting an another Covid test to figure out if she is Covid positive or not.  Patient denies headache, sore throat, chest pain, shortness of breath, abdominal pain, nausea, vomiting, diarrhea, pedal edema.  Past Medical History:  Diagnosis Date  . Anxiety   . 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 Asthma    no inhalers  . Ductal carcinoma in situ (DCIS) of left breast   . Joint pain   . Migraine   . Neurocardiogenic syncope   . PONV (postoperative nausea and vomiting)   . Restless leg syndrome     Patient Active Problem List   Diagnosis Date Noted  . Ductal carcinoma in situ (DCIS) of left breast 11/24/2019  . IUD (intrauterine device) in place 02/23/2016    . Mild dysplasia of cervix 03/17/2012    Past Surgical History:  Procedure Laterality Date  . BREAST LUMPECTOMY WITH RADIOACTIVE SEED LOCALIZATION Bilateral 01/29/2020   Procedure: BILATERAL BREAST LUMPECTOMY WITH RADIOACTIVE SEED LOCALIZATION;  Surgeon: Jovita Kussmaul, MD;  Location: [place];  Service: General;  Laterality: Bilateral;  . INTRAUTERINE DEVICE INSERTION  02/10/2016   Mirena  . KNEE SURGERY     X 4 = 1X RIGHT 3X LEFT/3 ARTHROSCOPIES AND 1 ACL  . KNEE SURGERY     ACL  . MM BREAST STEREO BIOPSY LEFT ([place] HX) Left   . WISDOM TOOTH EXTRACTION       OB History    Gravida  2   Para  2   Term      Preterm      AB  0   Living  2     SAB      TAB      Ectopic  0   Multiple      Live Births              Family History  Problem Relation Age of Onset  . COPD Father   . CAD Father   . Cancer Paternal Grandfather        COLON  . Hyperlipidemia Brother   . Hypertension Brother   . Stroke Maternal Grandmother   . Hyperlipidemia Brother   . Cancer  Paternal Aunt        PRIMARY PERITONEAL CANCER    Social History   Tobacco Use  . Smoking status: Former Smoker    Types: Cigarettes    Quit date: 07/09/1988    Years since quitting: 31.7  . Smokeless tobacco: Never Used  Vaping Use  . Vaping Use: Never used  Substance Use Topics  . Alcohol use: Yes    Comment: 1 time a month  . Drug use: No    Home Medications Prior to Admission medications   Medication Sig Start Date End Date Taking? Authorizing Provider  albuterol (VENTOLIN HFA) 108 (90 Base) MCG/ACT inhaler Inhale 1-2 puffs into the lungs every 6 (six) hours as needed for wheezing or shortness of breath. 04/13/20  Yes Wurst, [place], PA-C  ALPRAZolam (XANAX) 0.5 MG tablet Take 0.5 mg by mouth at bedtime as needed for anxiety.   Yes [provider]  benzonatate (TESSALON) 100 MG capsule Take 1 capsule (100 mg total) by mouth every 8 (eight) hours. 04/13/20  Yes Wurst, [place], PA-C   cetirizine (ZYRTEC) 10 MG tablet Take 10 mg by mouth daily.   Yes [provider]  Cholecalciferol (VITAMIN D) 50 MCG (2000 UT) tablet Take 2,000 Units by mouth daily.    Yes [provider]  montelukast (SINGULAIR) 10 MG tablet Take 1 tablet (10 mg total) by mouth at bedtime. 04/13/20  Yes Wurst, [place], PA-C  Multiple Vitamins-Minerals (MULTIVITAMIN WITH MINERALS) tablet Take 1 tablet by mouth daily.   Yes [provider]  sertraline (ZOLOFT) 50 MG tablet Take 50 mg by mouth daily. 09/17/19  Yes [provider]  thiamine (VITAMIN B-1) 50 MG tablet Take 50 mg by mouth daily.   Yes [provider]  MAGNESIUM PO Take 400 mg by mouth daily.  Patient not taking: Reported on 04/14/2020    [provider]  tamoxifen (NOLVADEX) 20 MG tablet Take 1 tablet (20 mg total) by mouth daily. 04/05/20   Nicholas Lose, MD  levonorgestrel (MIRENA) 20 MCG/24HR IUD 1 each by Intrauterine route once. INSERTED 05-01-06.   09/02/17  [provider]    Allergies    Diflucan [fluconazole], Mixed ragweed, and Sulfa antibiotics  Review of Systems   Review of Systems  Constitutional: Positive for chills and fever.  HENT: Negative for congestion, tinnitus, trouble swallowing and voice change.   Eyes: Negative for visual disturbance.  Respiratory: Positive for choking. Negative for shortness of breath.   Cardiovascular: Negative for chest pain and palpitations.  Gastrointestinal: Negative for abdominal pain, diarrhea, nausea and vomiting.  Genitourinary: Negative for dysuria, enuresis and flank pain.  Musculoskeletal: Negative for back pain.  Skin: Negative for rash.  Neurological: Negative for dizziness and headaches.  Hematological: Does not bruise/bleed easily.    Physical Exam Updated Vital Signs BP (!) 146/80   Pulse 80   Temp 98.5 F (36.9 C) (Oral)   Resp 18   Ht 5\' 4"  (1.626 m)   Wt 81.6 kg   SpO2 100%   BMI 30.90 kg/m   Physical  Exam Vitals and nursing note reviewed.  Constitutional:      General: She is not in acute distress.    Appearance: She is not ill-appearing.  HENT:     Head: Normocephalic and atraumatic.     Right Ear: Tympanic membrane, ear canal and external ear normal.     Left Ear: Tympanic membrane, ear canal and external ear normal.     Nose: No congestion.  Mouth/Throat:     Mouth: Mucous membranes are moist.     Pharynx: Oropharynx is clear. No oropharyngeal exudate or posterior oropharyngeal erythema.  Eyes:     General: No scleral icterus. Cardiovascular:     Rate and Rhythm: Normal rate and regular rhythm.     Pulses: Normal pulses.     Heart sounds: No murmur heard.  No friction rub. No gallop.   Pulmonary:     Effort: No respiratory distress.     Breath sounds: No wheezing, rhonchi or rales.  Abdominal:     General: There is no distension.     Tenderness: There is no abdominal tenderness. There is no right CVA tenderness, left CVA tenderness or guarding.  Musculoskeletal:        General: No swelling.     Right lower leg: No edema.     Left lower leg: No edema.  Skin:    General: Skin is warm and dry.     Capillary Refill: Capillary refill takes less than 2 seconds.     Findings: No rash.  Neurological:     Mental Status: She is alert.  Psychiatric:        Mood and Affect: Mood normal.     ED Results / Procedures / Treatments   Labs (all labs ordered are listed, but only abnormal results are displayed) Labs Reviewed  CBC WITH DIFFERENTIAL/PLATELET - Abnormal; Notable for the following components:      Result Value   WBC 14.1 (*)    Neutro Abs 10.9 (*)    Monocytes Absolute 1.8 (*)    Abs Immature Granulocytes 0.10 (*)    All other components within normal limits  RESP PANEL BY RT PCR (RSV, FLU A&B, COVID)  BASIC METABOLIC PANEL    EKG None  Radiology DG Chest Portable 1 View  Result Date: 04/14/2020 CLINICAL DATA:  Shortness of breath and cough EXAM:  PORTABLE CHEST 1 VIEW COMPARISON:  April 14, 2014 FINDINGS: There is slight medial right base atelectasis. Lungs elsewhere clear. Heart size and pulmonary vascularity are normal. No adenopathy. No bone lesions. IMPRESSION: Slight medial right base atelectasis. Lungs elsewhere clear. Cardiac silhouette normal. Electronically Signed   By: Lowella Grip III M.D.   On: 04/14/2020 14:41    Procedures Procedures (including critical care time)  Medications Ordered in ED Medications - No data to display  ED Course  I have reviewed the triage vital signs and the nursing notes.  Pertinent labs & imaging results that were available during my care of the patient were reviewed by me and considered in my medical decision making (see chart for details).    MDM Rules/Calculators/A&P                          I have personally reviewed all imaging, labs and have interpreted them.  Patient presents emergency department with chief complaint of viral URI symptoms and requesting Covid test.  She was alert, did not appear acute distress, vital signs reassuring.  Will order basic labs and respiratory panel as well as a chest x-ray.  CBC shows leukocytosis of 14.1, no anemia, BMP does not show electrolyte abnormalities, no metabolic acidosis, no AKI, no anion gap.  Respiratory panel negative for Covid, influenza A/B. Chest x-ray does not reveal any acute abnormalities.  I have low suspicion patient would be hospitalized due to Covid or for a URI as patient has no new oxygen requirements, no signs  respirations noted on exam, lung sounds were clear bilaterally.  Low suspicion for systemic infection/pneumonia as patient was nontoxic-appearing, vital signs reassuring, chest x-ray does not reveal any acute findings.  I suspect elevated white count is acute phase reactant.  Low suspicion for PE as patient denies pleuritic chest pain, shortness of breath, denies leg pain or leg swelling, no history of DVTs or Pes  history is more consistent with viral URI as she is complaining of general malaise fevers and chills with exposure to Covid.  Low suspicion for ACS as patient denies chest pain, shortness of breath, no signs of hypoperfusion or fluid overload noted on exam.  I suspect patient suffering from viral URI.  If she is Covid positive will refer her to the infusion clinic as she qualifies, if negative will recommend over-the-counter pain medications follow-up with PCP in 2 weeks time.  Vital signs remained stable, no indication for hospital admission.  Patient was given at home care and strict return precautions.  Patient verbalized that she understood agreed to plan. Final Clinical Impression(s) / ED Diagnoses Final diagnoses:  Viral upper respiratory tract infection    Rx / DC Orders ED Discharge Orders    None       Aron Baba 04/14/20 Aldona Lento, MD 04/15/20 [phone]

## 2020-04-14 NOTE — Discharge Instructions (Addendum)
You have been seen here for viral-like illness.  Your lab work and imaging look reassuring.  Your Covid test is pending at this time, please self isolate until you see results back on MyChart.  If you are Covid positive you must self quarantine for 10 days starting on symptom onset.  If Covid positive I would like you to contact "post Covid care" as they can provide more information on how to manage your Covid symptoms.  I would like you to use Tylenol as needed for fever control and ibuprofen as needed for pain control.  Please stay hydrated if you do not have an appetite please consume soups as this will give you fluids as well as calories.  Come back to emergency department if you develop chest pain, shortness of breath, severe abdominal pain, uncontrolled nausea, vomiting, diarrhea.

## 2020-04-19 ENCOUNTER — Ambulatory Visit: Payer: BC Managed Care – PPO

## 2020-04-19 ENCOUNTER — Other Ambulatory Visit: Payer: Self-pay

## 2020-04-19 DIAGNOSIS — Z483 Aftercare following surgery for neoplasm: Secondary | ICD-10-CM | POA: Diagnosis not present

## 2020-04-19 DIAGNOSIS — M25611 Stiffness of right shoulder, not elsewhere classified: Secondary | ICD-10-CM

## 2020-04-19 DIAGNOSIS — M25612 Stiffness of left shoulder, not elsewhere classified: Secondary | ICD-10-CM

## 2020-04-19 DIAGNOSIS — M542 Cervicalgia: Secondary | ICD-10-CM

## 2020-04-19 NOTE — Patient Instructions (Signed)
Pt given ship pack to place over firm area under breast at incision site.

## 2020-04-19 NOTE — Therapy (Signed)
[place], [place], [address] Phone: [phone]   Fax:  [phone]  Physical Therapy Treatment  Patient Details  Name: Pamela Martinez MRN: [id number] Date of Birth: 1971/02/14 Referring Provider (PT): Dr. Isidore Moos   Encounter Date: 04/19/2020   PT End of Session - 04/19/20 0855    Visit Number 3    Number of Visits 13    Date for PT Re-Evaluation 05/17/20    Authorization Type BC[employer]    PT Start Time 0801    PT Stop Time 0845    PT Time Calculation (min) 44 min    Activity Tolerance Patient tolerated treatment well   Pt had to sit up secondary to several coughing fits from URI.  Treatment modified to do exs in sitting/standing and coughing improved   Behavior During Therapy [facility] for tasks assessed/performed           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, 02/2014   Colposcopy with ECC showed KA 2015  recommend followup Pap smear/HPV at her annual 2016    Asthma    no inhalers   Ductal carcinoma in situ (DCIS) of left breast    Joint pain    Migraine    Neurocardiogenic syncope    PONV (postoperative nausea and vomiting)    Restless leg syndrome     Past Surgical History:  Procedure Laterality Date   BREAST LUMPECTOMY WITH RADIOACTIVE SEED LOCALIZATION Bilateral 01/29/2020   Procedure: BILATERAL BREAST LUMPECTOMY WITH RADIOACTIVE SEED LOCALIZATION;  Surgeon: Jovita Kussmaul, MD;  Location: [facility];  Service: General;  Laterality: Bilateral;   INTRAUTERINE DEVICE INSERTION  02/10/2016   Mirena   KNEE SURGERY     X 4 = 1X RIGHT 3X LEFT/3 ARTHROSCOPIES AND 1 ACL   KNEE SURGERY     ACL   MM BREAST STEREO BIOPSY LEFT ([place] HX) Left    WISDOM TOOTH EXTRACTION      There were no vitals filed for this visit.   Subjective Assessment - 04/19/20 0804    Subjective Got really sick on Wed last week and went to [facility] and ED.  Had  bad URI symptoms and GI symtpoms.  Still have some symptoms of URI but COVID tests are negative.  Did not give me anything for symptoms. Haven't been able to do anything since Wed night because I have felt so bad.  Was in bed until Sunday pm I felt so bad. Skin on chest is feeling a lot better;  less tender and not itchy.                       [facility]patient [facility]  Lymphedema Life Impact Scale Total Score 33.82 %            [facility] Adult PT Treatment/Exercise - 04/19/20 0001      Shoulder Exercises: Pulleys   Flexion 2 minutes    Flexion Limitations tight right neck with  left shoulder flex    Scaption 2 minutes      Shoulder Exercises: Therapy Ball   Flexion 10 reps      Shoulder Exercises: ROM/Strengthening   Other ROM/Strengthening Exercises standing wall slides for flex and abd 5 ea      Manual Therapy   Manual Therapy Edema management;Soft tissue mobilization;Manual Lymphatic Drainage (MLD);Passive ROM    Edema  Management chip pack made for firm area arround incision inferior to breast    Manual Lymphatic Drainage (MLD) short neck,bilateral axillary LN, Anterior inter axillary anastomosis, Axillo inguinal, right trunk and breast in supine and SL    Passive ROM to left shoulder in supine flex, abd, ER                    PT Short Term Goals - 04/05/20 1714      PT SHORT TERM GOAL #1   Title Pt will be ind with self MLD for the left breast    Time 3    Period Weeks    Status New             PT Long Term Goals - 04/05/20 1713      PT LONG TERM GOAL #1   Title Pt will improve cervical spine AROM to painfree and equal bilaterally    Time 6    Period Weeks    Status New      PT LONG TERM GOAL #2   Title Pt will report she is able to undress independently due to improved shoulder ROM    Time 6    Period Weeks    Status New      PT LONG TERM GOAL #3   Title Pt will decrease left  breast swelling by at least 50% to improve breast discomfort    Time 6    Period Weeks    Status New                 Plan - 04/19/20 0857    Clinical Impression Statement Pt with URI but feeling better. Had several coughing attacks where she was allowed to sit up and recover.  Skin irritation is gone and pt had good tolerance for Manual lymph drainage.  ROM of shoulders improving, and relaxed better for PROM.  Area of induration under left breast at incision.  Foam chip pack made for pts bra    Examination-Activity Limitations Reach Overhead;Sleep    Stability/Clinical Decision Making Stable/Uncomplicated    Rehab Potential Excellent    PT Frequency 2x / week    PT Duration 6 weeks    PT Treatment/Interventions ADLs/Self Care Home Management;Manual lymph drainage;Patient/family education;Manual techniques;Therapeutic exercise;Taping    PT Next Visit Plan assess benefit of chip pack, instruct self MLD, continue ROM    Consulted and Agree with Plan of Care Patient           Patient will benefit from skilled therapeutic intervention in order to improve the following deficits and impairments:  Increased edema, Pain, Impaired UE functional use, Postural dysfunction, Decreased range of motion  Visit Diagnosis: Aftercare following surgery for neoplasm  Stiffness of left shoulder, not elsewhere classified  Stiffness of right shoulder, not elsewhere classified  Cervicalgia     Problem List Patient Active Problem List   Diagnosis Date Noted   Ductal carcinoma in situ (DCIS) of left breast 11/24/2019   IUD (intrauterine device) in place 02/23/2016   Mild dysplasia of cervix 03/17/2012    Pamela Martinez 04/19/2020, 9:03 AM  [place], [place], [address] Phone: [phone]   Fax:  [phone]  Name: Pamela Martinez MRN: [id number] Date of Birth: [birth year]

## 2020-04-20 ENCOUNTER — Ambulatory Visit: Payer: BC Managed Care – PPO

## 2020-04-20 DIAGNOSIS — Z483 Aftercare following surgery for neoplasm: Secondary | ICD-10-CM

## 2020-04-20 DIAGNOSIS — M542 Cervicalgia: Secondary | ICD-10-CM

## 2020-04-20 DIAGNOSIS — M25612 Stiffness of left shoulder, not elsewhere classified: Secondary | ICD-10-CM

## 2020-04-20 DIAGNOSIS — M25611 Stiffness of right shoulder, not elsewhere classified: Secondary | ICD-10-CM

## 2020-04-20 NOTE — Therapy (Signed)
[place], [place], [address] Phone: [phone]   Fax:  [phone]  Physical Therapy Treatment  Patient Details  Name: Pamela Martinez MRN: [id number] Date of Birth: 11-May-1971 Referring Provider (PT): Dr. Isidore Moos   Encounter Date: 04/20/2020   PT End of Session - 04/20/20 1609    Visit Number 4    Number of Visits 13    Date for PT Re-Evaluation 05/17/20    PT Start Time 1504    PT Stop Time 1556    PT Time Calculation (min) 52 min    Activity Tolerance Patient tolerated treatment well    Behavior During Therapy [facility] for tasks assessed/performed           Past Medical History:  Diagnosis Date  . Anxiety   . 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 Asthma    no inhalers  . Ductal carcinoma in situ (DCIS) of left breast   . Joint pain   . Migraine   . Neurocardiogenic syncope   . PONV (postoperative nausea and vomiting)   . Restless leg syndrome     Past Surgical History:  Procedure Laterality Date  . BREAST LUMPECTOMY WITH RADIOACTIVE SEED LOCALIZATION Bilateral 01/29/2020   Procedure: BILATERAL BREAST LUMPECTOMY WITH RADIOACTIVE SEED LOCALIZATION;  Surgeon: Jovita Kussmaul, MD;  Location: [facility];  Service: General;  Laterality: Bilateral;  . INTRAUTERINE DEVICE INSERTION  02/10/2016   Mirena  . KNEE SURGERY     X 4 = 1X RIGHT 3X LEFT/3 ARTHROSCOPIES AND 1 ACL  . KNEE SURGERY     ACL  . MM BREAST STEREO BIOPSY LEFT ([place] HX) Left   . WISDOM TOOTH EXTRACTION      There were no vitals filed for this visit.   Subjective Assessment - 04/20/20 1505    Subjective Feeling better overall. Feels tight on left side of neck but not painful.  MLD felt good.  Starting to feel better after URI but I have to sleep.  Chip pack seemed to help but yesterday I got some sharp pains so took my bra  off at the end of the day.  It has been fine today and seems softer.    Pertinent History bilateral lumpectomy by Dr. [name] on 01/29/20 due to left breast cancer showing DCIS and Rt clear. No lymph nodes removed.  ER/PR positive. Radiation done Friday.    Patient Stated Goals swelling, get the soreness out    Currently in Pain? No/denies                       Outpatient [facility]12/[facility]  Lymphedema Life Impact Scale Total Score 33.82 %            [facility] Adult PT Treatment/Exercise - 04/20/20 0001      Shoulder Exercises: Pulleys   Flexion 2 minutes    Scaption 2 minutes      Shoulder Exercises: Therapy Ball   Flexion 10 reps      Manual Therapy   Manual Therapy Edema management;Soft tissue mobilization;Manual Lymphatic Drainage (MLD);Passive ROM    Soft tissue mobilization To left UT, levator cervical, and lateral upper arm with coconut oil    Manual Lymphatic Drainage (MLD) short neck, Deep breathing x 5,Right axillary LN, Left inguinal LN,Anterior inter axillary  anastomosis, Left Axillo inguinal pathway, left lat. trunk and breast in supine and SL. Pt. instructed in same     Passive ROM to left shoulder in supine flex, abd, ER                  PT Education - 04/20/20 1608    Education Details Pt instructed in self MLD to breast and was given written instructions for home use    Person(s) Educated Patient    Methods Explanation;Demonstration;Verbal cues;Handout    Comprehension Verbalized understanding;Returned demonstration;Need further instruction            PT Short Term Goals - 04/05/20 1714      PT SHORT TERM GOAL #1   Title Pt will be ind with self MLD for the left breast    Time 3    Period Weeks    Status New             PT Long Term Goals - 04/05/20 1713      PT LONG TERM GOAL #1   Title Pt will improve cervical spine AROM to painfree and equal bilaterally    Time 6    Period  Weeks    Status New      PT LONG TERM GOAL #2   Title Pt will report she is able to undress independently due to improved shoulder ROM    Time 6    Period Weeks    Status New      PT LONG TERM GOAL #3   Title Pt will decrease left breast swelling by at least 50% to improve breast discomfort    Time 6    Period Weeks    Status New                 Plan - 04/20/20 1615    Clinical Impression Statement Pt. continues to relax better for shoulder ROM and notes that overall pain in shoulder and axilla has improved considerably.  Chip pack worked well    Merchandiser, retail    Stability/Clinical Decision Making Stable/Uncomplicated    Rehab Potential Excellent    PT Frequency 2x / week    PT Duration 6 weeks    PT Treatment/Interventions ADLs/Self Care Home Management;Manual lymph drainage;Patient/family education;Manual techniques;Therapeutic exercise;Taping    PT Next Visit Plan Review self MLD and teach husband if he comes in, continue ROM, exs, STW prn    PT Home Exercise Plan , self MLD    Consulted and Agree with Plan of Care Patient           Patient will benefit from skilled therapeutic intervention in order to improve the following deficits and impairments:  Increased edema, Pain, Impaired UE functional use, Postural dysfunction, Decreased range of motion  Visit Diagnosis: Aftercare following surgery for neoplasm  Stiffness of left shoulder, not elsewhere classified  Stiffness of right shoulder, not elsewhere classified  Cervicalgia     Problem List Patient Active Problem List   Diagnosis Date Noted  . Ductal carcinoma in situ (DCIS) of left breast 11/24/2019  . IUD (intrauterine device) in place 02/23/2016  . Mild dysplasia of cervix 03/17/2012    Claris Pong, PT 04/20/2020, 4:46 PM  [place], [place], [address] Phone: [phone]    Fax:  [phone]  Name: Pamela Martinez MRN: [id number] Date of Birth: 09/21/1970

## 2020-04-20 NOTE — Patient Instructions (Signed)
Manual Lymph Drainage for Left Breast.  Do daily.  Do slowly. Use flat hands with just enough pressure to stretch the skin. Do not slide over the skin, but move the skin with the hand you're using. Lie down or sit comfortably (in a recliner, for example) to do this.  1) Hug yourself:  cross arms and do circles at collar bones near neck 5-7 times (to "wake up" lots of lymph nodes in this area). 2) Take slow deep breaths, allowing your belly to balloon out as your breathe in, 5x (to "wake up" abdominal lymph nodes to take on extra fluid). 3) Right armpit-stretch skin in small circles to stimulate intact lymph nodes there, 5-7x. 4) Left groin area, at panty line-stretch skin in small circles to stimulate lymph nodes 5-7x. 5) Redirect fluid from left chest toward right armpit (stretch skin starting at left chest in 3-4 spots working toward right armpit) 3-4x across the chest. 6) Redirect fluid from left armpit toward left groin (cup your hand around the curve of your left side and do 3-4 "pumps" from armpit to groin) 3-4x down your side. 7) Draw an imaginary diagonal line from upper outer breast through the nipple area toward lower inner breast.  Direct fluid upward and inward from this line toward the pathway across your upper chest (established in #5).  Do this in three rows to treat all of the upper inner breast tissue, and do each row 3-4x. 8) Then repeat #5 above. 9) Direct fluid to treat all of lower outer breast tissue downward and outward toward pathway established in #6 that is aimed at the left groin. 10)  Then repeat #6 above. 11)  End with repeating #3 and #4 above.   [facility] [address] [place], [place]   [address] [phone] 

## 2020-04-25 ENCOUNTER — Ambulatory Visit: Payer: BC Managed Care – PPO | Admitting: Physical Therapy

## 2020-04-25 ENCOUNTER — Other Ambulatory Visit: Payer: Self-pay

## 2020-04-25 ENCOUNTER — Encounter: Payer: Self-pay | Admitting: Physical Therapy

## 2020-04-25 DIAGNOSIS — M25612 Stiffness of left shoulder, not elsewhere classified: Secondary | ICD-10-CM

## 2020-04-25 DIAGNOSIS — Z483 Aftercare following surgery for neoplasm: Secondary | ICD-10-CM

## 2020-04-25 NOTE — Patient Instructions (Signed)
First of all, check with your insurance company to see if provider is in network    [facility] (for wigs and compression sleeves / gloves/gauntlets )  [address] [place], [place] [address] [phone]  Will file some insurances --- call for appointment   [facility] (for mastectomy prosthetics and garments) [address] [place], [place] [address] [phone] Will file some insurances --- call for appointment  [facility]  [address]  [place], [place] [address] [phone] Lower extremity garments  [facility] [address] [place], [place]  [address] [phone]   [facility] (for compression garments, especilly for lower extremities) [address] [place], [place]  [address] [phone] Call for appointment    Melissa Meares ,certified fitter [facility]  [phone]  [facility] (for mastectomy supplies and garments) [address] [place], [place] [address] [phone]  Other Resources: National Lymphedema Network:  www.lymphnet.org www.K[employer].com for patient articles and self manual lymph drainage information www.lymphedemablog.com has informative articles.  www.compressionguru.com www.lymphedemaproducts.com www.brightlifedirect.com 

## 2020-04-25 NOTE — Therapy (Signed)
Pamela Martinez, [place], [address] Phone: [phone]   Fax:  [phone]  Physical Therapy Treatment  Patient Details  Name: Pamela Martinez MRN: [id number] Date of Birth: 08-24-1970 Referring Provider (PT): Dr. Isidore Moos   Encounter Date: 04/25/2020   PT End of Session - 04/25/20 1157    Visit Number 5    Number of Visits 13    Date for PT Re-Evaluation 05/17/20    PT Start Time 1100    PT Stop Time 1145    PT Time Calculation (min) 45 min    Activity Tolerance Patient tolerated treatment well    Behavior During Therapy [facility] for tasks assessed/performed           Past Medical History:  Diagnosis Date  . Anxiety   . 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 Asthma    no inhalers  . Ductal carcinoma in situ (DCIS) of left breast   . Joint pain   . Migraine   . Neurocardiogenic syncope   . PONV (postoperative nausea and vomiting)   . Restless leg syndrome     Past Surgical History:  Procedure Laterality Date  . BREAST LUMPECTOMY WITH RADIOACTIVE SEED LOCALIZATION Bilateral 01/29/2020   Procedure: BILATERAL BREAST LUMPECTOMY WITH RADIOACTIVE SEED LOCALIZATION;  Surgeon: Jovita Kussmaul, MD;  Location: [place];  Service: General;  Laterality: Bilateral;  . INTRAUTERINE DEVICE INSERTION  02/10/2016   Mirena  . KNEE SURGERY     X 4 = 1X RIGHT 3X LEFT/3 ARTHROSCOPIES AND 1 ACL  . KNEE SURGERY     ACL  . MM BREAST STEREO BIOPSY LEFT ([place] HX) Left   . WISDOM TOOTH EXTRACTION      There were no vitals filed for this visit.                [facility]patient [facility]  Lymphedema Life Impact Scale Total Score 33.82 %            [facility] Adult PT Treatment/Exercise - 04/25/20 0001      Exercises   Exercises Shoulder    encouraged pt to continue stretching      Shoulder Exercises: Sidelying   ABduction AROM;Left;5 reps    ABduction Limitations with deep breath at the top, stretch felt into breast     Other Sidelying Exercises from right sidelying, backward rotation with arm extended for thoracic rotation and anterior shoulder stretch       Manual Therapy   Manual Therapy Edema management;Manual Lymphatic Drainage (MLD)    Edema Management Pt given sheet for addresses of mastecomy products as she may want to get partial prosthetics. Also given information from [employer] for online yoga and other classes     Manual Lymphatic Drainage (MLD) short neck, Deep breathing x 5,Right axillary LN, Left inguinal LN,Anterior inter axillary anastomosis, Left Axillo inguinal pathway, left lat. trunk and breast in supine and SL. Pt.    extra time spent on inferior breast    Passive ROM to left shoulder in supine flex, abd, ER                    PT Short Term Goals - 04/05/20 1714      PT SHORT TERM GOAL #1   Title Pt will be ind with self  MLD for the left breast    Time 3    Period Weeks    Status New             PT Long Term Goals - 04/05/20 1713      PT LONG TERM GOAL #1   Title Pt will improve cervical spine AROM to painfree and equal bilaterally    Time 6    Period Weeks    Status New      PT LONG TERM GOAL #2   Title Pt will report she is able to undress independently due to improved shoulder ROM    Time 6    Period Weeks    Status New      PT LONG TERM GOAL #3   Title Pt will decrease left breast swelling by at least 50% to improve breast discomfort    Time 6    Period Weeks    Status New                 Plan - 04/25/20 1157    Clinical Impression Statement Pt continues to improve . She has minor fullness at inferior breast near scar that she says is helped with chip pack and MLD. she is not sure she will get a compression bra as her regular bras seem to be doing ok  but she may want to get partial prosthetics to "even out" Gave information about where to get that and online yoga classess for more stretching    Examination-Activity Limitations Reach Overhead;Sleep    Stability/Clinical Decision Making Stable/Uncomplicated    Rehab Potential Excellent    PT Frequency 2x / week    PT Duration 6 weeks    PT Treatment/Interventions ADLs/Self Care Home Management;Manual lymph drainage;Patient/family education;Manual techniques;Therapeutic exercise;Taping    PT Next Visit Plan check goals continue with MLD and monitoring chip pack ,Review self MLD and teach husband if he comes in, continue ROM, exs, STW prn    PT Home Exercise Plan , self MLD    Consulted and Agree with Plan of Care Patient           Patient will benefit from skilled therapeutic intervention in order to improve the following deficits and impairments:  Increased edema, Pain, Impaired UE functional use, Postural dysfunction, Decreased range of motion  Visit Diagnosis: Aftercare following surgery for neoplasm  Stiffness of left shoulder, not elsewhere classified     Problem List Patient Active Problem List   Diagnosis Date Noted  . Ductal carcinoma in situ (DCIS) of left breast 11/24/2019  . IUD (intrauterine device) in place 02/23/2016  . Mild dysplasia of cervix 03/17/2012   Donato Heinz. Owens Shark PT  [name] 04/25/2020, 12:03 PM  [place], [place], [address] Phone: [phone]   Fax:  [phone]  Name: Pamela Martinez MRN: [id number] Date of Birth: [birth year]

## 2020-04-27 ENCOUNTER — Other Ambulatory Visit: Payer: Self-pay

## 2020-04-27 ENCOUNTER — Ambulatory Visit: Payer: BC Managed Care – PPO

## 2020-04-27 DIAGNOSIS — M25611 Stiffness of right shoulder, not elsewhere classified: Secondary | ICD-10-CM

## 2020-04-27 DIAGNOSIS — M25612 Stiffness of left shoulder, not elsewhere classified: Secondary | ICD-10-CM

## 2020-04-27 DIAGNOSIS — M542 Cervicalgia: Secondary | ICD-10-CM

## 2020-04-27 DIAGNOSIS — Z483 Aftercare following surgery for neoplasm: Secondary | ICD-10-CM | POA: Diagnosis not present

## 2020-04-27 NOTE — Therapy (Signed)
[place], [place], [address] Phone: [phone]   Fax:  [phone]  Physical Therapy Treatment  Patient Details  Name: Pamela Martinez MRN: [id number] Date of Birth: Apr 26, 1971 Referring Provider (PT): Dr. Isidore Moos   Encounter Date: 04/27/2020   PT End of Session - 04/27/20 0958    Visit Number 6    Number of Visits 13    Date for PT Re-Evaluation 05/17/20    Authorization Type BCBS    PT Start Time 0807    PT Stop Time 0850    PT Time Calculation (min) 43 min    Activity Tolerance Patient tolerated treatment well    Behavior During Therapy [facility] for tasks assessed/performed           Past Medical History:  Diagnosis Date  . Anxiety   . 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 Asthma    no inhalers  . Ductal carcinoma in situ (DCIS) of left breast   . Joint pain   . Migraine   . Neurocardiogenic syncope   . PONV (postoperative nausea and vomiting)   . Restless leg syndrome     Past Surgical History:  Procedure Laterality Date  . BREAST LUMPECTOMY WITH RADIOACTIVE SEED LOCALIZATION Bilateral 01/29/2020   Procedure: BILATERAL BREAST LUMPECTOMY WITH RADIOACTIVE SEED LOCALIZATION;  Surgeon: Jovita Kussmaul, MD;  Location: [place];  Service: General;  Laterality: Bilateral;  . INTRAUTERINE DEVICE INSERTION  02/10/2016   Mirena  . KNEE SURGERY     X 4 = 1X RIGHT 3X LEFT/3 ARTHROSCOPIES AND 1 ACL  . KNEE SURGERY     ACL  . MM BREAST STEREO BIOPSY LEFT ([place] HX) Left   . WISDOM TOOTH EXTRACTION      There were no vitals filed for this visit.   Subjective Assessment - 04/27/20 0807    Subjective Cold is much better.  Neck is still tight but its related to my jobs and school.Ship pack is making a big difference; feeling softer.  Tightness in axillary region is improving.  Has done some of  the lymph drainage. Still get intermittent shooting pains through breast, more so when I take chip pack out    Pertinent History bilateral lumpectomy by Dr. [name] on 01/29/20 due to left breast cancer showing DCIS and Rt clear. No lymph nodes removed.  ER/PR positive. Radiation done Friday.    Patient Stated Goals swelling, get the soreness out    Currently in Pain? No/denies    Pain Onset More than a month ago    Aggravating Factors  removing shirts, extremes of reaching                       Outpatient Rehab from 04/12/2020 in [place]  Lymphedema Life Impact Scale Total Score 33.82 %            [facility] Adult PT Treatment/Exercise - 04/27/20 0001      Manual Therapy   Soft tissue mobilization scar mobs to inferior breast incision    Manual Lymphatic Drainage (MLD) short neck, Deep breathing x 5,Right axillary LN, Left inguinal LN,Anterior inter axillary anastomosis, Left Axillo inguinal pathway, left lat. trunk and breast in supine and SL. Reviewed all steps with  Pt. practicing as well   extra time spent on inferior breast  Passive ROM to left shoulder in supine flex, abd, ER                  PT Education - 04/27/20 0900    Education Details Reviewed self MLD with pt practicing all steps.  Able to return demonstrate    Person(s) Educated Patient    Methods Explanation;Demonstration    Comprehension Returned demonstration;Verbalized understanding            PT Short Term Goals - 04/05/20 1714      PT SHORT TERM GOAL #1   Title Pt will be ind with self MLD for the left breast    Time 3    Period Weeks    Status New             PT Long Term Goals - 04/05/20 1713      PT LONG TERM GOAL #1   Title Pt will improve cervical spine AROM to painfree and equal bilaterally    Time 6    Period Weeks    Status New      PT LONG TERM GOAL #2   Title Pt will report she is able to undress independently due to improved  shoulder ROM    Time 6    Period Weeks    Status New      PT LONG TERM GOAL #3   Title Pt will decrease left breast swelling by at least 50% to improve breast discomfort    Time 6    Period Weeks    Status New                 Plan - 04/27/20 0959    Clinical Impression Statement Pt is getting good benefit from chip pack but does not have in presently and area above scar continues with induration.  Shoulder ROM is improving nicely with less pulling.  She was able to return demonstrate good technique with self MLD with occasional VC's and tactile cues    Examination-Activity Limitations Reach Overhead;Sleep    Stability/Clinical Decision Making Stable/Uncomplicated    Rehab Potential Excellent    PT Frequency 2x / week    PT Duration 6 weeks    PT Treatment/Interventions ADLs/Self Care Home Management;Manual lymph drainage;Patient/family education;Manual techniques;Therapeutic exercise;Taping    PT Next Visit Plan check goals continue with MLD and monitoring chip pack ,Review self MLD and teach husband if he comes in, continue ROM, exs, STW prn    PT Home Exercise Plan , self MLD    Consulted and Agree with Plan of Care Patient           Patient will benefit from skilled therapeutic intervention in order to improve the following deficits and impairments:  Increased edema, Pain, Impaired UE functional use, Postural dysfunction, Decreased range of motion  Visit Diagnosis: Aftercare following surgery for neoplasm  Stiffness of left shoulder, not elsewhere classified  Stiffness of right shoulder, not elsewhere classified  Cervicalgia     Problem List Patient Active Problem List   Diagnosis Date Noted  . Ductal carcinoma in situ (DCIS) of left breast 11/24/2019  . IUD (intrauterine device) in place 02/23/2016  . Mild dysplasia of cervix 03/17/2012    Claris Pong, PT 04/27/2020, 10:04 AM  [place], [place], [address] Phone: [phone]   Fax:  [phone]  Name: Pamela Martinez MRN: [id number] Date of Birth: [birth year]

## 2020-05-02 ENCOUNTER — Other Ambulatory Visit: Payer: Self-pay

## 2020-05-02 ENCOUNTER — Ambulatory Visit: Payer: BC Managed Care – PPO

## 2020-05-02 DIAGNOSIS — M25611 Stiffness of right shoulder, not elsewhere classified: Secondary | ICD-10-CM

## 2020-05-02 DIAGNOSIS — Z483 Aftercare following surgery for neoplasm: Secondary | ICD-10-CM

## 2020-05-02 DIAGNOSIS — M25612 Stiffness of left shoulder, not elsewhere classified: Secondary | ICD-10-CM

## 2020-05-02 DIAGNOSIS — M542 Cervicalgia: Secondary | ICD-10-CM

## 2020-05-02 NOTE — Therapy (Signed)
[place], [place], [address] Phone: [phone]   Fax:  [phone]  Physical Therapy Treatment  Patient Details  Name: Pamela Martinez MRN: [id number] Date of Birth: 1971/03/21 Referring Provider (PT): Dr. Isidore Moos   Encounter Date: 05/02/2020   PT End of Session - 05/02/20 1215    Visit Number 7    Number of Visits 13    Date for PT Re-Evaluation 05/17/20    Authorization Type BCBS    PT Start Time 0858    PT Stop Time 0958    PT Time Calculation (min) 60 min    Activity Tolerance Patient tolerated treatment well    Behavior During Therapy [facility] for tasks assessed/performed           Past Medical History:  Diagnosis Date  . Anxiety   . 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 Asthma    no inhalers  . Ductal carcinoma in situ (DCIS) of left breast   . Joint pain   . Migraine   . Neurocardiogenic syncope   . PONV (postoperative nausea and vomiting)   . Restless leg syndrome     Past Surgical History:  Procedure Laterality Date  . BREAST LUMPECTOMY WITH RADIOACTIVE SEED LOCALIZATION Bilateral 01/29/2020   Procedure: BILATERAL BREAST LUMPECTOMY WITH RADIOACTIVE SEED LOCALIZATION;  Surgeon: Jovita Kussmaul, MD;  Location: [place];  Service: General;  Laterality: Bilateral;  . INTRAUTERINE DEVICE INSERTION  02/10/2016   Mirena  . KNEE SURGERY     X 4 = 1X RIGHT 3X LEFT/3 ARTHROSCOPIES AND 1 ACL  . KNEE SURGERY     ACL  . MM BREAST STEREO BIOPSY LEFT ([place] HX) Left   . WISDOM TOOTH EXTRACTION      There were no vitals filed for this visit.   Subjective Assessment - 05/02/20 0855    Subjective Chip packis going well, but after 12 hours I feel like I have to take my bra off.  Seems to be helping.  Shoulder was doing great until yesterday but I spent 7 hours on the computer and UT feel  really tight on both sides.    Pertinent History bilateral lumpectomy by Dr. [name] on 01/29/20 due to left breast cancer showing DCIS and Rt clear. No lymph nodes removed.  ER/PR positive. Radiation done Friday.    Limitations Lifting;House hold activities    Currently in Pain? No/denies    Pain Score 0    Pain Descriptors / Indicators Tightness    Pain Onset More than a month ago    Aggravating Factors  improving with removing shirts              [facility] PT Assessment - 05/02/20 0001      AROM   Right Shoulder Extension 62 Degrees    Right Shoulder Flexion 163 Degrees    Right Shoulder ABduction 165 Degrees    Right Shoulder Internal Rotation 60 Degrees    Right Shoulder External Rotation 95 Degrees    Left Shoulder Extension 61 Degrees    Left Shoulder Flexion 165 Degrees    Left Shoulder ABduction 165 Degrees    Left Shoulder Internal Rotation 68 Degrees    Left Shoulder External Rotation 86 Degrees    Cervical Flexion 55    Cervical - Right Side Bend 38    Cervical -  Left Side Bend 25    Cervical - Right Rotation 60    Cervical - Left Rotation 61                   [facility]  Lymphedema Life Impact Scale Total Score 33.82 %            [facility] Adult PT Treatment/Exercise - 05/02/20 0001      Manual Therapy   Soft tissue mobilization STM to left upper traps and cervical region, left lateral arm,scar mobs to inferior breast incision    Manual Lymphatic Drainage (MLD) short neck, Deep breathing x 5,Right axillary LN, Left inguinal LN,Anterior inter axillary anastomosis, Left Axillo inguinal pathway, left lat. trunk and breast in supine Reviewed all steps with  Pt. practicing  all   extra time spent on inferior breast    Passive ROM to left shoulder in supine flex, abd, ER      Neck Exercises: Stretches   Upper Trapezius Stretch Right;Left;2 reps;20 seconds    Upper Trapezius Stretch  Limitations tighter on left    Other Neck Stretches --    Other Neck Stretches Bil Rot, SB x 5 ea AROM                    PT Short Term Goals - 05/02/20 1324      PT SHORT TERM GOAL #1   Title Pt will be ind with self MLD for the left breast    Time 3    Period Weeks    Status Partially Met             PT Long Term Goals - 05/02/20 0953      PT LONG TERM GOAL #1   Title Pt will improve cervical spine AROM to painfree and equal bilaterally    Time 6    Period Weeks    Status Partially Met      PT LONG TERM GOAL #2   Title Pt will report she is able to undress independently due to improved shoulder ROM    Time 6    Period Weeks    Status Achieved      PT LONG TERM GOAL #3   Title Pt will decrease left breast swelling by at least 50% to improve breast discomfort    Baseline 30-40% better    Time 6    Period Weeks    Status Partially Met                 Plan - 05/02/20 1216    Clinical Impression Statement Scar is softer however proximal to incision remains firm.  Pt notes swelling improved by 30-40%.  Return demonstrate good technique with MLD.  Shoulder ROM improved and able to undress now independently.  Progressing well with goals    Examination-Activity Limitations Reach Overhead;Sleep    Stability/Clinical Decision Making Stable/Uncomplicated    Rehab Potential Excellent    PT Frequency 2x / week    PT Duration 6 weeks    PT Treatment/Interventions ADLs/Self Care Home Management;Manual lymph drainage;Patient/family education;Manual techniques;Therapeutic exercise;Taping    PT Next Visit Plan cont MLD, ROM, add TBand    PT Home Exercise Plan , self MLD    Consulted and Agree with Plan of Care Patient           Patient will benefit from skilled therapeutic intervention in order to improve the following deficits and impairments:  Increased edema, Pain, Impaired UE functional use, Postural dysfunction, Decreased range of motion  Visit  Diagnosis: Aftercare following surgery for neoplasm  Stiffness of left shoulder, not elsewhere classified  Stiffness of right shoulder, not elsewhere classified  Cervicalgia     Problem List Patient Active Problem List   Diagnosis Date Noted  . Ductal carcinoma in situ (DCIS) of left breast 11/24/2019  . IUD (intrauterine device) in place 02/23/2016  . Mild dysplasia of cervix 03/17/2012    Claris Pong, PT 05/02/2020, 12:26 PM  [place], [place], [address] Phone: [phone]   Fax:  [phone]  Name: Pamela Martinez MRN: [id number] Date of Birth: 1971/02/07

## 2020-05-04 ENCOUNTER — Ambulatory Visit: Payer: BC Managed Care – PPO

## 2020-05-04 ENCOUNTER — Other Ambulatory Visit: Payer: Self-pay

## 2020-05-04 DIAGNOSIS — M542 Cervicalgia: Secondary | ICD-10-CM

## 2020-05-04 DIAGNOSIS — Z483 Aftercare following surgery for neoplasm: Secondary | ICD-10-CM | POA: Diagnosis not present

## 2020-05-04 DIAGNOSIS — M25611 Stiffness of right shoulder, not elsewhere classified: Secondary | ICD-10-CM

## 2020-05-04 DIAGNOSIS — M25612 Stiffness of left shoulder, not elsewhere classified: Secondary | ICD-10-CM

## 2020-05-04 NOTE — Patient Instructions (Signed)
Pt. To cut up 1/2 in foam and make a new chip pack to see if denser foam helps decrease swelling better at incision.

## 2020-05-04 NOTE — Therapy (Signed)
[place], [place], [address] Phone: [phone]   Fax:  [phone]  Physical Therapy Treatment  Patient Details  Name: Pamela Martinez MRN: [id number] Date of Birth: 12-Dec-1970 Referring Provider (PT): Dr. Isidore Moos   Encounter Date: 05/04/2020   PT End of Session - 05/04/20 2059    Visit Number 8    Number of Visits 13    Date for PT Re-Evaluation 05/17/20    Authorization Type BCBS    PT Start Time 1402    PT Stop Time 1455    PT Time Calculation (min) 53 min    Activity Tolerance Patient tolerated treatment well    Behavior During Therapy [facility] for tasks assessed/performed           Past Medical History:  Diagnosis Date  . Anxiety   . 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 Asthma    no inhalers  . Ductal carcinoma in situ (DCIS) of left breast   . Joint pain   . Migraine   . Neurocardiogenic syncope   . PONV (postoperative nausea and vomiting)   . Restless leg syndrome     Past Surgical History:  Procedure Laterality Date  . BREAST LUMPECTOMY WITH RADIOACTIVE SEED LOCALIZATION Bilateral 01/29/2020   Procedure: BILATERAL BREAST LUMPECTOMY WITH RADIOACTIVE SEED LOCALIZATION;  Surgeon: Jovita Kussmaul, MD;  Location: [place];  Service: General;  Laterality: Bilateral;  . INTRAUTERINE DEVICE INSERTION  02/10/2016   Mirena  . KNEE SURGERY     X 4 = 1X RIGHT 3X LEFT/3 ARTHROSCOPIES AND 1 ACL  . KNEE SURGERY     ACL  . MM BREAST STEREO BIOPSY LEFT ([place] HX) Left   . WISDOM TOOTH EXTRACTION      There were no vitals filed for this visit.   Subjective Assessment - 05/04/20 1402    Subjective Feel like I need to be done next week.  We start an audit at work the following week.  My neck has been bothering me since I got up today. I feel a twinge that rotates all the way around my neck.     Pertinent History bilateral lumpectomy by Dr. [name] on 01/29/20 due to left breast cancer showing DCIS and Rt clear. No lymph nodes removed.  ER/PR positive. Radiation done Friday.    Limitations Lifting;House hold activities    Patient Stated Goals swelling, get the soreness out    Currently in Pain? Yes    Pain Score 2     Pain Location Neck    Pain Onset More than a month ago    Pain Descriptors / Indicators Sharp;Spasm    Pain Frequency Intermittent                       [facility]  Lymphedema Life Impact Scale Total Score 33.82 %            [facility] Adult PT Treatment/Exercise - 05/04/20 0001      Manual Therapy   Edema Management of 1/2 in foam to cut up and put in new chip pack to see if denser foam works better over incision.    Soft tissue mobilization STM to left upper traps and cervical region, left lateral arm,scar mobs to inferior breast incision    Manual  Lymphatic Drainage (MLD) short neck, Deep breathing x 5,Right axillary LN, Left inguinal LN,Anterior inter axillary anastomosis, Left Axillo inguinal pathway, left lat. trunk and breast in supine Reviewed all steps with  Pt. practicing  all   extra time spent on inferior breast    Passive ROM to left shoulder in supine flex, abd, ER                    PT Short Term Goals - 05/02/20 1062      PT SHORT TERM GOAL #1   Title Pt will be ind with self MLD for the left breast    Time 3    Period Weeks    Status Partially Met             PT Long Term Goals - 05/02/20 0953      PT LONG TERM GOAL #1   Title Pt will improve cervical spine AROM to painfree and equal bilaterally    Time 6    Period Weeks    Status Partially Met      PT LONG TERM GOAL #2   Title Pt will report she is able to undress independently due to improved shoulder ROM    Time 6    Period Weeks    Status Achieved      PT LONG TERM GOAL #3    Title Pt will decrease left breast swelling by at least 50% to improve breast discomfort    Baseline 30-40% better    Time 6    Period Weeks    Status Partially Met                 Plan - 05/04/20 2106    Clinical Impression Statement Pt. needs to be finished with PT next week secondary to an audit at work. She was given 1/2 in gray foam to cut up and place in a chip pack to see if denser foam will help soften area at scar.. Neck area felt much better after rx.   Examination-Activity Limitations Reach Overhead;Sleep    Stability/Clinical Decision Making Stable/Uncomplicated    Rehab Potential Excellent    PT Frequency 2x / week    PT Duration 6 weeks    PT Treatment/Interventions ADLs/Self Care Home Management;Manual lymph drainage;Patient/family education;Manual techniques;Therapeutic exercise;Taping    PT Next Visit Plan add TB, determine if she wants to come for both visits next week, Cont MLD, scar massage, ROM    PT Home Exercise Plan , self MLD    Consulted and Agree with Plan of Care Patient           Patient will benefit from skilled therapeutic intervention in order to improve the following deficits and impairments:  Increased edema, Pain, Impaired UE functional use, Postural dysfunction, Decreased range of motion  Visit Diagnosis: Aftercare following surgery for neoplasm  Stiffness of left shoulder, not elsewhere classified  Stiffness of right shoulder, not elsewhere classified  Cervicalgia     Problem List Patient Active Problem List   Diagnosis Date Noted  . Ductal carcinoma in situ (DCIS) of left breast 11/24/2019  . IUD (intrauterine device) in place 02/23/2016  . Mild dysplasia of cervix 03/17/2012    Elsie Ra [facility] 05/04/2020, 9:16 PM  [place], [place], [address] Phone: [phone]   Fax:  [phone]  Name: Aalyah Mansouri MRN: [id number] Date of  Birth: 02-02-1971

## 2020-05-09 ENCOUNTER — Ambulatory Visit: Payer: BC Managed Care – PPO | Attending: Radiation Oncology

## 2020-05-09 ENCOUNTER — Other Ambulatory Visit: Payer: Self-pay

## 2020-05-09 DIAGNOSIS — M25611 Stiffness of right shoulder, not elsewhere classified: Secondary | ICD-10-CM

## 2020-05-09 DIAGNOSIS — M25612 Stiffness of left shoulder, not elsewhere classified: Secondary | ICD-10-CM

## 2020-05-09 DIAGNOSIS — M542 Cervicalgia: Secondary | ICD-10-CM | POA: Diagnosis present

## 2020-05-09 DIAGNOSIS — Z483 Aftercare following surgery for neoplasm: Secondary | ICD-10-CM

## 2020-05-09 NOTE — Patient Instructions (Signed)
Access Code: [email or web address] URL: [email or web address] Date: 05/09/2020 Prepared by: Cheral Almas  Exercises Standing Shoulder Row with Anchored Resistance - 1 x daily - 7 x weekly - 1 sets - 10 reps Shoulder Extension with Resistance - 1 x daily - 7 x weekly - 1 sets - 10 reps Supine Shoulder Horizontal Abduction with Resistance - 1 x daily - 7 x weekly - 1 sets - 10 reps

## 2020-05-09 NOTE — Therapy (Signed)
[place], [place], [address] Phone: [phone]   Fax:  [phone]  Physical Therapy Treatment  Patient Details  Name: Pamela Martinez MRN: [id number] Date of Birth: 06-Feb-1971 Referring Provider (PT): Dr. Isidore Moos   Encounter Date: 05/09/2020   PT End of Session - 05/09/20 1358    Visit Number 9    Number of Visits 13    Date for PT Re-Evaluation 05/17/20    Authorization Type BCBS    PT Start Time 1306    PT Stop Time 1352    PT Time Calculation (min) 46 min    Activity Tolerance Patient tolerated treatment well    Behavior During Therapy [facility] for tasks assessed/performed           Past Medical History:  Diagnosis Date  . Anxiety   . 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 Asthma    no inhalers  . Ductal carcinoma in situ (DCIS) of left breast   . Joint pain   . Migraine   . Neurocardiogenic syncope   . PONV (postoperative nausea and vomiting)   . Restless leg syndrome     Past Surgical History:  Procedure Laterality Date  . BREAST LUMPECTOMY WITH RADIOACTIVE SEED LOCALIZATION Bilateral 01/29/2020   Procedure: BILATERAL BREAST LUMPECTOMY WITH RADIOACTIVE SEED LOCALIZATION;  Surgeon: Jovita Kussmaul, MD;  Location: [place];  Service: General;  Laterality: Bilateral;  . INTRAUTERINE DEVICE INSERTION  02/10/2016   Mirena  . KNEE SURGERY     X 4 = 1X RIGHT 3X LEFT/3 ARTHROSCOPIES AND 1 ACL  . KNEE SURGERY     ACL  . MM BREAST STEREO BIOPSY LEFT ([place] HX) Left   . WISDOM TOOTH EXTRACTION      There were no vitals filed for this visit.   Subjective Assessment - 05/09/20 1306    Subjective Feel like I am doing well.  We will make Wednesday my last visit. We made the other chip pack and it seems to be doing better and with less swelling.  Continued to have some neck pain the rest of  last week, nut not yesterday or today.    Pertinent History bilateral lumpectomy by Dr. [name] on 01/29/20 due to left breast cancer showing DCIS and Rt clear. No lymph nodes removed.  ER/PR positive. Radiation done Friday.    Currently in Pain? Yes    Pain Score 5     Pain Descriptors / Indicators --   Headache   Pain Type Other (Comment)   Headache   Pain Onset More than a month ago                       [facility]patient [facility]  Lymphedema Life Impact Scale Total Score 33.82 %            [facility] Adult PT Treatment/Exercise - 05/09/20 0001      Shoulder Exercises: Standing   Horizontal ABduction Strengthening;Both;10 reps    Theraband Level (Shoulder Horizontal ABduction) Level 1 (Yellow)   supine)   Extension Strengthening;Left;Both;12 reps    Theraband Level (Shoulder Extension) Level 1 (Yellow)    Row Strengthening;Both;12 reps    Theraband Level (Shoulder Row) Level 1 (Yellow)      Manual Therapy   Edema Management chip pack at  inferior breast incision    Soft tissue mobilization STM to Bilateral upper traps and cervical region, left lateral arm,scar mobs to inferior breast incision. manual cervical traction    Manual Lymphatic Drainage (MLD) short neck, Deep breathing x 5,Right axillary LN, Left inguinal LN,Anterior inter axillary anastomosis, Left Axillo inguinal pathway, left lat. trunk and breast in supine Reviewed all steps with  Pt. practicing  all   extra time spent on inferior breast    Passive ROM to left shoulder in supine flex, abd, ER                  PT Education - 05/09/20 1357    Education Details Educated in Scapular retraction, shoulder extension and supine horizontal abd all with yellow band x 10 for HEP    Person(s) Educated Patient    Methods Explanation;Demonstration;Handout    Comprehension Verbalized understanding;Returned demonstration            PT Short Term Goals -  05/02/20 0952      PT SHORT TERM GOAL #1   Title Pt will be ind with self MLD for the left breast    Time 3    Period Weeks    Status Partially Met             PT Long Term Goals - 05/02/20 0953      PT LONG TERM GOAL #1   Title Pt will improve cervical spine AROM to painfree and equal bilaterally    Time 6    Period Weeks    Status Partially Met      PT LONG TERM GOAL #2   Title Pt will report she is able to undress independently due to improved shoulder ROM    Time 6    Period Weeks    Status Achieved      PT LONG TERM GOAL #3   Title Pt will decrease left breast swelling by at least 50% to improve breast discomfort    Baseline 30-40% better    Time 6    Period Weeks    Status Partially Met                 Plan - 05/09/20 1358    Clinical Impression Statement Pt with a HA and with continued neck spasm over the end of last week.  Felt better after manual traction and STW. PROM progressing well.  Scar with decreased induration but 1 area of continued firmness improved with denser foam.  Pt would like to have strength after breast CA packet next visit.    Examination-Activity Limitations Reach Overhead;Sleep    Stability/Clinical Decision Making Stable/Uncomplicated    Rehab Potential Excellent    PT Frequency 2x / week    PT Duration 6 weeks    PT Treatment/Interventions ADLs/Self Care Home Management;Manual lymph drainage;Patient/family education;Manual techniques;Therapeutic exercise;Taping    PT Next Visit Plan After breast cancer strengthening packet, check goals/discharge    PT Home Exercise Plan , self MLD, HEP for exercises    Consulted and Agree with Plan of Care Patient           Patient will benefit from skilled therapeutic intervention in order to improve the following deficits and impairments:  Increased edema, Pain, Impaired UE functional use, Postural dysfunction, Decreased range of motion  Visit Diagnosis: Stiffness of left shoulder, not  elsewhere classified  Aftercare following surgery for neoplasm  Stiffness of right shoulder, not elsewhere classified  Cervicalgia  Problem List Patient Active Problem List   Diagnosis Date Noted  . Ductal carcinoma in situ (DCIS) of left breast 11/24/2019  . IUD (intrauterine device) in place 02/23/2016  . Mild dysplasia of cervix 03/17/2012    [facility] 05/09/2020, 2:02 PM  [place], [place], [address] Phone: [phone]   Fax:  [phone]  Name: Pamela Martinez MRN: [id number] Date of Birth: 26-Jan-1971

## 2020-05-11 ENCOUNTER — Encounter: Payer: Self-pay | Admitting: Radiation Oncology

## 2020-05-11 ENCOUNTER — Ambulatory Visit: Payer: BC Managed Care – PPO

## 2020-05-11 ENCOUNTER — Other Ambulatory Visit: Payer: Self-pay

## 2020-05-11 ENCOUNTER — Ambulatory Visit
Admission: RE | Admit: 2020-05-11 | Discharge: 2020-05-11 | Disposition: A | Discharge status: Home / Self Care | Payer: BC Managed Care – PPO | Source: Ambulatory Visit | Attending: Radiation Oncology | Admitting: Radiation Oncology

## 2020-05-11 VITALS — BP 115/73 | HR 66 | Temp 98.1°F | Resp 17

## 2020-05-11 DIAGNOSIS — M25612 Stiffness of left shoulder, not elsewhere classified: Secondary | ICD-10-CM

## 2020-05-11 DIAGNOSIS — Z79899 Other long term (current) drug therapy: Secondary | ICD-10-CM | POA: Diagnosis not present

## 2020-05-11 DIAGNOSIS — Z483 Aftercare following surgery for neoplasm: Secondary | ICD-10-CM

## 2020-05-11 DIAGNOSIS — M25611 Stiffness of right shoulder, not elsewhere classified: Secondary | ICD-10-CM

## 2020-05-11 DIAGNOSIS — Z170 Estrogen receptor positive status [ER+]: Secondary | ICD-10-CM | POA: Insufficient documentation

## 2020-05-11 DIAGNOSIS — M542 Cervicalgia: Secondary | ICD-10-CM

## 2020-05-11 DIAGNOSIS — D0512 Intraductal carcinoma in situ of left breast: Secondary | ICD-10-CM | POA: Diagnosis present

## 2020-05-11 DIAGNOSIS — Z923 Personal history of irradiation: Secondary | ICD-10-CM | POA: Diagnosis not present

## 2020-05-11 NOTE — Patient Instructions (Signed)
Pt reviewed all exs instructed and we verbally and partially reviewed exercises from the [facility] strengthening packet.  Pt verbalized understanding.  Pt had no questions about MLD or HEP as instructed

## 2020-05-11 NOTE — Progress Notes (Signed)
  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   Plan: The patient will follow-up with radiation oncology in 81mo . -----------------------------------  Eppie Gibson, MD

## 2020-05-11 NOTE — Progress Notes (Signed)
Ms. Dettinger presents today for follow-up after completing radiation to her left breast on 04/01/2020  Pain: Reports occasional sharp pain around lumpectomy site. Tenderness around fluid pocket (PT helping) Skin: Repots skin is intact and well healed ROM: Reports she finished her last PT session today, and is pleased with her progess Swelling: Small area of fluid to the front/medial acpect of the breast. PT has shown patient how to do lymphatic massage, and patient reports area seems to be resolving MedOnc F/U: Scheduled for [place] with Mendel Ryder Causey-NP on 07/13/2020 Other issues of note: Some lingering fatigue, but otherwise nothing of note. Patient has done well since completing treatment  Vitals:   05/11/20 1532  BP: 115/73  Pulse: 66  Resp: 17  Temp: 98.1 F (36.7 C)  SpO2: 98%   Wt Readings from Last 3 Encounters:  04/14/20 180 lb (81.6 kg)  04/13/20 180 lb (81.6 kg)  04/05/20 186 lb 14.4 oz (84.8 kg)

## 2020-05-11 NOTE — Therapy (Signed)
[place], [place], [address] Phone: [phone]   Fax:  [phone]  Physical Therapy Treatment  Patient Details  Name: Pamela Martinez MRN: [id number] Date of Birth: 1971-05-21 Referring Provider (PT): Dr. Isidore Moos   Encounter Date: 05/11/2020   PT End of Session - 05/11/20 1508    Visit Number 10    Number of Visits 13    Date for PT Re-Evaluation 05/17/20    PT Start Time 1400    PT Stop Time 1455    PT Time Calculation (min) 55 min    Activity Tolerance Patient tolerated treatment well    Behavior During Therapy [facility] for tasks assessed/performed           Past Medical History:  Diagnosis Date  . Anxiety   . 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 Asthma    no inhalers  . Ductal carcinoma in situ (DCIS) of left breast   . Joint pain   . Migraine   . Neurocardiogenic syncope   . PONV (postoperative nausea and vomiting)   . Restless leg syndrome     Past Surgical History:  Procedure Laterality Date  . BREAST LUMPECTOMY WITH RADIOACTIVE SEED LOCALIZATION Bilateral 01/29/2020   Procedure: BILATERAL BREAST LUMPECTOMY WITH RADIOACTIVE SEED LOCALIZATION;  Surgeon: Jovita Kussmaul, MD;  Location: [place];  Service: General;  Laterality: Bilateral;  . INTRAUTERINE DEVICE INSERTION  02/10/2016   Mirena  . KNEE SURGERY     X 4 = 1X RIGHT 3X LEFT/3 ARTHROSCOPIES AND 1 ACL  . KNEE SURGERY     ACL  . MM BREAST STEREO BIOPSY LEFT ([place] HX) Left   . WISDOM TOOTH EXTRACTION      There were no vitals filed for this visit.   Subjective Assessment - 05/11/20 1401    Subjective Graduation day from PT today.  Have last visit with radiation oncologist today.. Pain is alot better.  Probably 80% better, and swelling is better by 50-60%              [facility] PT Assessment - 05/11/20 0001       AROM   Right Shoulder Extension 62 Degrees    Right Shoulder Flexion 164 Degrees    Right Shoulder ABduction 175 Degrees    Right Shoulder Internal Rotation 60 Degrees    Right Shoulder External Rotation 95 Degrees    Cervical Flexion 55    Cervical - Right Side Bend 36    Cervical - Left Side Bend 25    Cervical - Right Rotation 72    Cervical - Left Rotation 65                   Outpatient Re[facility] from [facility]12/[facility]  Lymphedema Life Impact Scale Total Score 33.82 %            [facility] Adult PT Treatment/Exercise - 05/11/20 0001      Lumbar Exercises: Supine   Bridge 5 reps    Bridge Limitations --   none     Lumbar Exercises: Quadruped   Opposite Arm/Leg Raise Right arm/Left leg;Left arm/Right leg;5 reps;Limitations    Opposite Arm/Leg Raise Limitations POOR BALANCE      Shoulder Exercises: Standing   Horizontal ABduction Strengthening;Both;10 reps    Extension Strengthening;Left;Both;12 reps    Theraband  Level (Shoulder Extension) Level 1 (Yellow)    Row Strengthening;Both;12 reps    Theraband Level (Shoulder Row) Level 1 (Yellow)      Shoulder Exercises: Pulleys   Flexion 2 minutes    ABduction 2 minutes      Shoulder Exercises: Therapy Ball   Flexion 10 reps                    PT Short Term Goals - 05/11/20 1447      PT SHORT TERM GOAL #1   Title Pt will be ind with self MLD for the left breast    Time 3    Period weeks   Status Achieved             PT Long Term Goals - 05/11/20 1447      PT LONG TERM GOAL #1   Title Pt will improve cervical spine AROM to painfree and equal bilaterally    Time 6    Period Weeks    Status Partially Met      PT LONG TERM GOAL #2   Title Pt will report she is able to undress independently due to improved shoulder ROM    Time 6    Period Weeks    Status Achieved      PT LONG TERM GOAL #3   Title Pt will decrease left breast swelling by at least 50%  to improve breast discomfort    Baseline 50-60% BETTER    Time 6    Period --    Status --                 Plan - 05/11/20 1508    Clinical Impression Statement Pt has achieved all goals established except for neck ROM equal bilaterally.  She has shoulder ROM WNL without pain, Decreased swelling at incision site, and she is independent with Self MLD.  She feels ready to be released to HEP.    Examination-Activity Limitations Reach Overhead;Sleep    Stability/Clinical Decision Making Stable/Uncomplicated    Clinical Decision Making Low    Rehab Potential Excellent    PT Frequency 2x / week    PT Duration 6 weeks    PT Treatment/Interventions ADLs/Self Care Home Management;Manual lymph drainage;Patient/family education;Manual techniques;Therapeutic exercise;Taping    PT Home Exercise Plan self MLD, Shoulder ROM exs and neck stretches prn, ABC strengthening exs    Consulted and Agree with Plan of Care Patient           Patient will benefit from skilled therapeutic intervention in order to improve the following deficits and impairments:  Increased edema, Pain, Impaired UE functional use, Postural dysfunction, Decreased range of motion  Visit Diagnosis: Stiffness of left shoulder, not elsewhere classified  Aftercare following surgery for neoplasm  Stiffness of right shoulder, not elsewhere classified  Cervicalgia     Problem List Patient Active Problem List   Diagnosis Date Noted  . Ductal carcinoma in situ (DCIS) of left breast 11/24/2019  . IUD (intrauterine device) in place 02/23/2016  . 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  Patient goals were met. Patient is being  discharged due to meeting the stated rehab goals.  ?????     Sherrin Daisy 05/11/2020, 3:18 PM  [place], [place], [address] Phone: [phone]   Fax:  [phone]  Name: Pamela Martinez MRN: [id number] Date of Birth: 10-14-1970

## 2020-05-11 NOTE — Progress Notes (Signed)
Radiation Oncology         [phone]) [phone] ________________________________  Name: Pamela Martinez MRN: [id number]  Date: 05/11/2020  DOB: Jan 16, 1971  Follow-Up Visit Note  Outpatient  CC: Celene Squibb, MD  Celene Squibb, MD  Diagnosis and Prior Radiotherapy:    ICD-10-CM   1. Ductal carcinoma in situ (DCIS) of left breast  D05.12    Radiation Treatment Dates: 03/07/2020 through 04/01/2020 Site Technique Total Dose (Gy) Dose per Fx (Gy) Completed Fx Beam Energies  Breast, Left: Breast_Lt 3D 42.56/42.56 2.66 16/16 6X, 10X    CHIEF COMPLAINT: Here for follow-up and surveillance of DCIS  Narrative:  The patient returns today for routine follow-up.  She is doing well.  She finished her last physical therapy session today.  She still has a little bit of tenderness around the lumpectomy site but physical therapy has helped.  She is not applying any lotion any longer to her skin.  She notes that her left nipple is hypopigmented                              ALLERGIES:  is allergic to diflucan [fluconazole], mixed ragweed, and sulfa antibiotics.  Meds: Current Outpatient Medications  Medication Sig Dispense Refill  . albuterol (VENTOLIN HFA) 108 (90 Base) MCG/ACT inhaler Inhale 1-2 puffs into the lungs every 6 (six) hours as needed for wheezing or shortness of breath. 18 g 0  . ALPRAZolam (XANAX) 0.5 MG tablet Take 0.5 mg by mouth at bedtime as needed for anxiety.    . benzonatate (TESSALON) 100 MG capsule Take 1 capsule (100 mg total) by mouth every 8 (eight) hours. 21 capsule 0  . cetirizine (ZYRTEC) 10 MG tablet Take 10 mg by mouth daily.    . Cholecalciferol (VITAMIN D) 50 MCG (2000 UT) tablet Take 2,000 Units by mouth daily.     Marland Kitchen MAGNESIUM PO Take 400 mg by mouth daily.  (Patient not taking: Reported on 04/14/2020)    . montelukast (SINGULAIR) 10 MG tablet Take 1 tablet (10 mg total) by mouth at bedtime. 30 tablet 0  . Multiple Vitamins-Minerals (MULTIVITAMIN WITH MINERALS) tablet  Take 1 tablet by mouth daily.    . sertraline (ZOLOFT) 50 MG tablet Take 50 mg by mouth daily.    . tamoxifen (NOLVADEX) 20 MG tablet Take 1 tablet (20 mg total) by mouth daily. 90 tablet 3  . thiamine (VITAMIN B-1) 50 MG tablet Take 50 mg by mouth daily.     Current Facility-Administered Medications  Medication Dose Route Frequency Provider Last Rate Last Admin  . levonorgestrel (MIRENA) 20 MCG/24HR IUD   Intrauterine Once Fontaine, Belinda Block, MD        Physical Findings: The patient is in no acute distress. Patient is alert and oriented.  oral temperature is 98.1 F (36.7 C). Her blood pressure is 115/73 and her pulse is 66. Her respiration is 17 and oxygen saturation is 98%. .    Satisfactory skin healing in radiotherapy fields.  There is hyperpigmented skin remaining over the left breast and the areola and nipple are hypopigmented on the left compared to the right.  Skin is intact  Lab Findings: Lab Results  Component Value Date   WBC 14.1 (H) 04/14/2020   HGB 14.1 04/14/2020   HCT 41.4 04/14/2020   MCV 96.1 04/14/2020   PLT 219 04/14/2020    Radiographic Findings: DG Chest Portable 1 View  Result Date:  04/14/2020 CLINICAL DATA:  Shortness of breath and cough EXAM: PORTABLE CHEST 1 VIEW COMPARISON:  April 14, 2014 FINDINGS: There is slight medial right base atelectasis. Lungs elsewhere clear. Heart size and pulmonary vascularity are normal. No adenopathy. No bone lesions. IMPRESSION: Slight medial right base atelectasis. Lungs elsewhere clear. Cardiac silhouette normal. Electronically Signed   By: Lowella Grip III M.D.   On: 04/14/2020 14:41    Impression/Plan: Healing well from radiotherapy to the breast tissue.  Continue skin care with topical Vitamin E Oil  BID for at least 2 more months for further healing.  I encouraged her to continue with yearly mammography as appropriate and followup with medical oncology. I will see her back on an as-needed basis. I have  encouraged her to call if she has any issues or concerns in the future. I wished her the very best.  At her request I also wrote her a prescription for 2 custom bras.   On date of service, in total, I spent 10 minutes on this encounter. Patient was seen in person.  _____________________________________   Eppie Gibson, MD

## 2020-06-08 ENCOUNTER — Telehealth: Payer: Self-pay

## 2020-06-08 NOTE — Telephone Encounter (Signed)
Patient called to discuss possible reaction to new medication, Tamoxifen.    Patient reports significant itching to torso, hips, and back.  Patient denies any new lotions, detergent, or other new medication.   No shortness of breath, or tongue swelling.   Per MD recommendations patient to hold medication X 2 weeks.  May take OTC benadryl to control itching.  Pt will call clinic in 2 weeks to update on itching.  Will review with MD at that time for further recommendations for medication management.   Pt verbalized understanding and agreement.  No further needs at this time.

## 2020-06-22 ENCOUNTER — Telehealth: Payer: Self-pay

## 2020-06-22 MED ORDER — TAMOXIFEN CITRATE 20 MG PO TABS
20.0000 mg | ORAL_TABLET | Freq: Every day | ORAL | 0 refills | Status: DC
Start: 2020-06-22 — End: 2020-07-21

## 2020-06-22 NOTE — Telephone Encounter (Signed)
Pt called to report that itching has subsided since discontinuation of Tamoxifen.  Pt states, "after about 10 days, itching was finally gone."   Patient is currently still having a menstrual cycle -   RN reviewed with MD - MD recommendation to obtain brand name Tamoxifen.   Pt aware - verbalized understanding and agreement.  Pt will notify clinic of any side effects.   If patient experiences any side effects, next recommendations from MD to obtain [facility] and Estradiol levels.    Rx sent for Nolvadex 20mg  tablet to pharmacy.

## 2020-06-24 ENCOUNTER — Telehealth: Payer: Self-pay

## 2020-06-24 DIAGNOSIS — D0512 Intraductal carcinoma in situ of left breast: Secondary | ICD-10-CM

## 2020-06-24 NOTE — Telephone Encounter (Signed)
Pt called to update that insurance will not cover brand name Tamoxifen.  Per pharmacy cost would be $5,000 for a 30 day supply.    RN contacted pharmacy, no option to submit PA.   Per MD recommendations patient will have labs drawn [facility] and estradiol to determine next step for medication.

## 2020-06-30 ENCOUNTER — Inpatient Hospital Stay: Payer: BC Managed Care – PPO | Attending: Adult Health

## 2020-06-30 ENCOUNTER — Other Ambulatory Visit: Payer: Self-pay

## 2020-06-30 ENCOUNTER — Telehealth: Payer: Self-pay | Admitting: Adult Health

## 2020-06-30 DIAGNOSIS — D0511 Intraductal carcinoma in situ of right breast: Secondary | ICD-10-CM | POA: Insufficient documentation

## 2020-06-30 DIAGNOSIS — D0512 Intraductal carcinoma in situ of left breast: Secondary | ICD-10-CM

## 2020-06-30 NOTE — Telephone Encounter (Signed)
Rescheduled appointment due to provider admin time. Patient is aware of changes.

## 2020-07-01 LAB — FOLLICLE STIMULATING HORMONE: FSH: 17.2 m[IU]/mL

## 2020-07-09 LAB — ESTRADIOL, ULTRA SENS: Estradiol, Sensitive: 395.3 pg/mL

## 2020-07-12 ENCOUNTER — Telehealth: Payer: Self-pay | Admitting: Adult Health

## 2020-07-12 NOTE — Telephone Encounter (Signed)
Rescheduled appointment per 1/4 schedule message. Patient is aware of changes. 

## 2020-07-13 ENCOUNTER — Inpatient Hospital Stay: Payer: Self-pay | Admitting: Adult Health

## 2020-07-13 ENCOUNTER — Encounter: Payer: BC Managed Care – PPO | Admitting: Adult Health

## 2020-07-20 ENCOUNTER — Encounter: Payer: Self-pay | Admitting: *Deleted

## 2020-07-20 NOTE — Progress Notes (Signed)
Per Md after reviewing pt labs, pt not in menopause and needs to take Tamoxifen.  Pt with hx of allergic reaction with severe itching to generic Tamoxifen and insurance not wanting to cover name brand.  Per MD pt to review her current bottle of Tamoxifen and see who the manufacture is and call other local pharmacies to see if they offer the generic tamoxifen made by a different manufacture to assess if pt is able to tolerate that medication.  Pt verbalized understanding and states she will call the office when she finds a pharmacy so the office can send in a new prescription.

## 2020-07-21 ENCOUNTER — Telehealth: Payer: Self-pay

## 2020-07-21 MED ORDER — TAMOXIFEN CITRATE 20 MG PO TABS
20.0000 mg | ORAL_TABLET | Freq: Every day | ORAL | 0 refills | Status: DC
Start: 2020-07-21 — End: 2020-08-17

## 2020-07-21 NOTE — Telephone Encounter (Signed)
Pt called to report that she has found a pharmacy that carries a [employer] representative for Tamoxifen.    New Rx placed to [facility] - pt aware.   Pt verbalized she will contact clinic if any symptoms develop with utilizing this manufacture.

## 2020-07-22 ENCOUNTER — Telehealth: Payer: Self-pay | Admitting: Adult Health

## 2020-07-22 NOTE — Telephone Encounter (Signed)
Rescheduled appts per 1/14 emial from [facility]. Pt confirmed appt date and time.

## 2020-07-25 ENCOUNTER — Inpatient Hospital Stay: Payer: Self-pay | Admitting: Adult Health

## 2020-07-29 ENCOUNTER — Inpatient Hospital Stay: Payer: BC Managed Care – PPO | Attending: Adult Health | Admitting: Adult Health

## 2020-07-29 ENCOUNTER — Encounter: Payer: Self-pay | Admitting: Adult Health

## 2020-07-29 DIAGNOSIS — Z79810 Long term (current) use of selective estrogen receptor modulators (SERMs): Secondary | ICD-10-CM

## 2020-07-29 DIAGNOSIS — D0512 Intraductal carcinoma in situ of left breast: Secondary | ICD-10-CM

## 2020-07-29 DIAGNOSIS — Z853 Personal history of malignant neoplasm of breast: Secondary | ICD-10-CM | POA: Diagnosis not present

## 2020-07-29 NOTE — Progress Notes (Signed)
SURVIVORSHIP VIRTUAL VISIT:  I connected with Emali Withrow on 07/29/20 at  8:30 AM EST by telephone (video connection issues) and verified that I am speaking with the correct person using two identifiers.  I discussed the limitations, risks, security and privacy concerns of performing an evaluation and management service by telephone and the availability of in person appointments. I also discussed with the patient that there may be a patient responsible charge related to this service. The patient expressed understanding and agreed to proceed.   BRIEF ONCOLOGIC HISTORY:  Oncology History  Ductal carcinoma in situ (DCIS) of left breast  11/04/2019 Initial Diagnosis   Screening mammogram on 10/12/19 detected a possible left breast mass. Diagnostic mammogram and [place] on 10/27/19 showed a 0.6cm mass at the 8:30 position in the left breast. Biopsy on 11/04/19 showed ductal carcinoma in situ with calcifications partially involving an intraductal papilloma, low grade, ER/PR+ 100%.    01/29/2020 Surgery   Bilateral lumpectomies [name]) [id number]): left breast: low grade DCIS involving papilloma, clear margins; right breast: no evidence of carcinoma. No regional lymph nodes were examined.   02/01/2020 Cancer Staging   Staging form: Breast, AJCC 8th Edition - Pathologic stage from 02/01/2020: Stage 0 (pTis (DCIS), pN0, cM0, ER+, PR+)   03/07/2020 - 04/01/2020 Radiation Therapy   The patient initially received a dose of 42.56 Gy in 16 fractions to the breast using whole-breast tangent fields. This was delivered using a 3-D conformal technique. The total dose was 42.56 Gy.   06/2020 - 06/2025 Anti-estrogen oral therapy   Tamoxifen     INTERVAL HISTORY:  Ms. Krall to review her survivorship care plan detailing her treatment course for breast cancer, as well as monitoring long-term side effects of that treatment, education regarding health maintenance, screening, and overall wellness and health promotion.      Overall, Ms. Leveque reports feeling quite well.  She notes that she started Tamoxifen in 04/2020 in 05/2020 she developed itching.  She stopped the tamoxifen, the itching subsided and went to a different pharmacy where they carried a different manufacturer and switched to tamoxifen from there last week.  She has had no issues.  She has vaginal discharge and an increase in fatigue.    REVIEW OF SYSTEMS:  Review of Systems  Constitutional: Positive for fatigue. Negative for appetite change, chills, fever and unexpected weight change.  HENT:   Negative for hearing loss, lump/mass and trouble swallowing.   Eyes: Negative for eye problems and icterus.  Respiratory: Negative for chest tightness, cough and shortness of breath.   Cardiovascular: Negative for chest pain, leg swelling and palpitations.  Gastrointestinal: Negative for abdominal distention, abdominal pain, constipation, diarrhea, nausea and vomiting.  Endocrine: Negative for hot flashes.  Genitourinary: Negative for difficulty urinating.   Musculoskeletal: Negative for arthralgias.  Skin: Negative for itching and rash.  Neurological: Negative for dizziness, extremity weakness, headaches and numbness.  Hematological: Negative for adenopathy. Does not bruise/bleed easily.  Psychiatric/Behavioral: Negative for depression. The patient is not nervous/anxious.   Breast: Denies any new nodularity, masses, tenderness, nipple changes, or nipple discharge.      ONCOLOGY TREATMENT TEAM:  1. Surgeon:  Dr. [name] at [facility] 2. Medical Oncologist: Dr. Lindi Adie  3. Radiation Oncologist: Dr. Isidore Moos    PAST MEDICAL/SURGICAL HISTORY:  Past Medical History:  Diagnosis Date  . Anxiety   . ASCUS with positive high risk HPV 07/2011, 02/2014   Colposcopy with ECC showed KA 2015  recommend followup Pap smear/HPV at her  annual 2016   . Asthma    no inhalers  . Ductal carcinoma in situ (DCIS) of left breast   . Joint pain   .  Migraine   . Neurocardiogenic syncope   . PONV (postoperative nausea and vomiting)   . Restless leg syndrome    Past Surgical History:  Procedure Laterality Date  . BREAST LUMPECTOMY WITH RADIOACTIVE SEED LOCALIZATION Bilateral 01/29/2020   Procedure: BILATERAL BREAST LUMPECTOMY WITH RADIOACTIVE SEED LOCALIZATION;  Surgeon: Griselda Miner, MD;  Location: [facility] OR;  Service: General;  Laterality: Bilateral;  . INTRAUTERINE DEVICE INSERTION  02/10/2016   Mirena  . KNEE SURGERY     X 4 = 1X RIGHT 3X LEFT/3 ARTHROSCOPIES AND 1 ACL  . KNEE SURGERY     ACL  . MM BREAST STEREO BIOPSY LEFT ([facility] HX) Left   . WISDOM TOOTH EXTRACTION       ALLERGIES:  Allergies  Allergen Reactions  . Diflucan [Fluconazole] Dermatitis    Causes welts  . Mixed Ragweed Other (See Comments)    Seasonal allergies   . Sulfa Antibiotics Itching     CURRENT MEDICATIONS:  Outpatient Encounter Medications as of 07/29/2020  Medication Sig  . albuterol (VENTOLIN HFA) 108 (90 Base) MCG/ACT inhaler Inhale 1-2 puffs into the lungs every 6 (six) hours as needed for wheezing or shortness of breath.  . ALPRAZolam (XANAX) 0.5 MG tablet Take 0.5 mg by mouth at bedtime as needed for anxiety.  . benzonatate (TESSALON) 100 MG capsule Take 1 capsule (100 mg total) by mouth every 8 (eight) hours.  . cetirizine (ZYRTEC) 10 MG tablet Take 10 mg by mouth daily.  . Cholecalciferol (VITAMIN D) 50 MCG (2000 UT) tablet Take 2,000 Units by mouth daily.   Marland Kitchen MAGNESIUM PO Take 400 mg by mouth daily.  (Patient not taking: Reported on 04/14/2020)  . montelukast (SINGULAIR) 10 MG tablet Take 1 tablet (10 mg total) by mouth at bedtime.  . Multiple Vitamins-Minerals (MULTIVITAMIN WITH MINERALS) tablet Take 1 tablet by mouth daily.  . sertraline (ZOLOFT) 50 MG tablet Take 50 mg by mouth daily.  . tamoxifen (NOLVADEX) 20 MG tablet Take 1 tablet (20 mg total) by mouth daily.  Marland Kitchen thiamine (VITAMIN B-1) 50 MG tablet Take 50 mg by mouth daily.  .  [DISCONTINUED] levonorgestrel (MIRENA) 20 MCG/24HR IUD 1 each by Intrauterine route once. INSERTED 05-01-06.    Facility-Administered Encounter Medications as of 07/29/2020  Medication  . levonorgestrel (MIRENA) 20 MCG/24HR IUD     ONCOLOGIC FAMILY HISTORY:  Family History  Problem Relation Age of Onset  . COPD Father   . CAD Father   . Cancer Paternal Grandfather        COLON  . Hyperlipidemia Brother   . Hypertension Brother   . Stroke Maternal Grandmother   . Hyperlipidemia Brother   . Cancer Paternal Aunt        PRIMARY PERITONEAL CANCER     GENETIC COUNSELING/TESTING: Not at this time  SOCIAL HISTORY:  Social History   Socioeconomic History  . Marital status: Married    Spouse name: Jorja Loa  . Number of children: 2  . Years of education: Not on file  . Highest education level: Associate degree: academic program  Occupational History    Comment: working on [employer]  Tobacco Use  . Smoking status: Former Smoker    Types: Cigarettes    Quit date: 07/09/1988    Years since quitting: 32.0  . Smokeless tobacco: Never Used  Vaping Use  . Vaping Use: Never used  Substance and Sexual Activity  . Alcohol use: Yes    Comment: 1 time a month  . Drug use: No  . Sexual activity: Yes    Birth control/protection: I.U.D.  Other Topics Concern  . Not on file  Social History Narrative   Lives with husband    caffeine, tea x 1, soda x 1   Social Determinants of Health   Financial Resource Strain: Not on file  Food Insecurity: Not on file  Transportation Needs: Not on file  Physical Activity: Not on file  Stress: Not on file  Social Connections: Not on file  Intimate Partner Violence: Not on file     OBSERVATIONS/OBJECTIVE:  Patient sounds well.  She is in no apparent distress.  Breathing is non labored.    LABORATORY DATA:  None for this visit.  DIAGNOSTIC IMAGING:  None for this visit.      ASSESSMENT AND PLAN:  Ms.. Henkin is a pleasant 50 y.o. female with  Stage 0 left breast DCIS, ER+/PR+, diagnosed in 10/2019, treated with lumpectomy, adjuvant radiation therapy, and anti-estrogen therapy with Tamoxifen beginning in 06/2020.  She presents to the Survivorship Clinic for our initial meeting and routine follow-up post-completion of treatment for breast cancer.    1. Stage 0 left breast cancer:  Ms. Sparr is continuing to recover from definitive treatment for breast cancer. She will follow-up with her medical oncologist, Dr. Lindi Adie in 11/2020 with history and physical exam per surveillance protocol.  She will continue her anti-estrogen therapy with Tamoxifen. Thus far, she is tolerating the Tamoxifen well, with minimal side effects. She was instructed to make Dr. Lindi Adie or myself aware if she begins to experience any worsening side effects of the medication and I could see her back in clinic to help manage those side effects, as needed. Her mammogram is due 10/2020; orders placed today.   Today, a comprehensive survivorship care plan and treatment summary was reviewed with the patient today detailing her breast cancer diagnosis, treatment course, potential late/long-term effects of treatment, appropriate follow-up care with recommendations for the future, and patient education resources.  A copy of this summary, along with a letter will be sent to the patient's primary care provider via mail/fax/In Basket message after today's visit.    2. Bone health:  She was given education on specific activities to promote bone health.  3. Cancer screening:  Due to Ms. Abate's history and her age, she should receive screening for skin cancers, colon cancer, and gynecologic cancers.  The information and recommendations are listed on the patient's comprehensive care plan/treatment summary and were reviewed in detail with the patient.    4. Health maintenance and wellness promotion: Ms. Hoey was encouraged to consume 5-7 servings of fruits and vegetables per day. We  reviewed the "Nutrition Rainbow" handout, as well as the handout "Take Control of Your Health and Reduce Your Cancer Risk" from the [place].  She was also encouraged to engage in moderate to vigorous exercise for 30 minutes per day most days of the week. We discussed the [employer] fitness program, which is designed for cancer survivors to help them become more physically fit after cancer treatments.  She was instructed to limit her alcohol consumption and continue to abstain from tobacco use.     5. Support services/counseling: It is not uncommon for this period of the patient's cancer care trajectory to be one of many emotions and stressors.  We discussed how this can be increasingly difficult during the times of quarantine and social distancing due to the COVID-19 pandemic.   She was given information regarding our available services and encouraged to contact me with any questions or for help enrolling in any of our support group/programs.    Follow up instructions:    -Return to cancer center in 11/2020  -Mammogram due in 10/2020 -Follow up with Dr. Carolynne Edouard in 05/2021 -She is welcome to return back to the Survivorship Clinic at any time; no additional follow-up needed at this time.  -Consider referral back to survivorship as a long-term survivor for continued surveillance  The patient was provided an opportunity to ask questions and all were answered. The patient agreed with the plan and demonstrated an understanding of the instructions.   The patient was advised to call back or seek an in-person evaluation if the symptoms worsen or if the condition fails to improve as anticipated.   I provided 21 minutes of non face-to-face telephone visit time during this encounter, and > 50% was spent counseling as documented under my assessment & plan.  Lillard Anes, NP 07/29/20 9:00 AM Medical Oncology and Hematology [facility] [address] [place], [place]  [address] Tel. [phone]    Fax. [phone]

## 2020-08-01 ENCOUNTER — Telehealth: Payer: Self-pay | Admitting: Hematology and Oncology

## 2020-08-01 NOTE — Telephone Encounter (Signed)
Scheduled appts per 12/1 los. Left voicemail with appt date and time.  °

## 2020-08-17 ENCOUNTER — Other Ambulatory Visit: Payer: Self-pay | Admitting: Hematology and Oncology

## 2020-09-16 ENCOUNTER — Other Ambulatory Visit: Payer: Self-pay | Admitting: Hematology and Oncology

## 2020-09-22 ENCOUNTER — Other Ambulatory Visit: Payer: Self-pay

## 2020-09-22 ENCOUNTER — Encounter: Payer: Self-pay | Admitting: Nurse Practitioner

## 2020-09-22 ENCOUNTER — Ambulatory Visit: Payer: BC Managed Care – PPO | Admitting: Nurse Practitioner

## 2020-09-22 VITALS — BP 120/68 | HR 68 | Ht 64.5 in | Wt 185.0 lb

## 2020-09-22 DIAGNOSIS — Z01419 Encounter for gynecological examination (general) (routine) without abnormal findings: Secondary | ICD-10-CM

## 2020-09-22 DIAGNOSIS — Z853 Personal history of malignant neoplasm of breast: Secondary | ICD-10-CM

## 2020-09-22 DIAGNOSIS — Z30431 Encounter for routine checking of intrauterine contraceptive device: Secondary | ICD-10-CM

## 2020-09-22 NOTE — Progress Notes (Signed)
   Pamela Martinez [birth year] [id number]   History:  50 y.o. G2P0002 presents for annual exam. Mirena IUD 02/2016. 2015 ASCUS positive HPV with negative C & B, 2019 ASCUS negative HPV. 11/2019 left DCIS managed with lumpectomy, radiation, and Tamoxifen. She is about to start a weight loss program and would like lab work done today. Her employer requires a doctor's note for the purchase of a standing desk and would like this today. She feels sitting affects her posture and causes joint stiffness.   Gynecologic History No LMP recorded. (Menstrual status: IUD).   Contraception/Family planning: IUD  Health Maintenance Last Pap: 09/22/2019. Results were: normal Last mammogram: 10/12/2019. Results were: left breast DCIS Last colonoscopy: Never Last Dexa: Never  Past medical history, past surgical history, family history and social history were all reviewed and documented in the EPIC chart.  ROS:  A ROS was performed and pertinent positives and negatives are included.  Exam:  Vitals:   09/22/20 1351  BP: 120/68  Pulse: 68  Weight: 185 lb (83.9 kg)  Height: 5' 4.5" (1.638 m)   Body mass index is 31.26 kg/m.  General appearance:  Normal Thyroid:  Symmetrical, normal in size, without palpable masses or nodularity. Respiratory  Auscultation:  Clear without wheezing or rhonchi Cardiovascular  Auscultation:  Regular rate, without rubs, murmurs or gallops  Edema/varicosities:  Not grossly evident Abdominal  Soft,nontender, without masses, guarding or rebound.  Liver/spleen:  No organomegaly noted  Hernia:  None appreciated  Skin  Inspection:  Grossly normal   Breasts: Examined lying and sitting.   Right: Without masses, retractions, discharge or axillary adenopathy.   Left: Without masses, retractions, discharge or axillary adenopathy. Well-healed lumpectomy scar.  Gentitourinary   Inguinal/mons:  Normal without inguinal adenopathy  External genitalia:   Normal  BUS/Urethra/Skene's glands:  Normal  Vagina:  Normal  Cervix:  Normal, IUD string visible at endocervix  Uterus:  Normal in size, shape and contour.  Midline and mobile  Adnexa/parametria:     Rt: Without masses or tenderness.   Lt: Without masses or tenderness.  Anus and perineum: Normal  Digital rectal exam: Normal sphincter tone without palpated masses or tenderness  Assessment/Plan:  50 y.o. G2P0002 for annual exam.   Well female exam with routine gynecological exam - Plan: CBC with Differential/Platelet, Comprehensive metabolic panel, Lipid panel, Hemoglobin A1c. Education provided on SBEs, importance of preventative screenings, current guidelines, high calcium diet, regular exercise, and multivitamin daily.   History of left breast cancer - 11/2019 left DCIS managed with lumpectomy, radiation, and Tamoxifen for 5 year duration.   Encounter for routine checking of intrauterine contraceptive device (IUD) - Mirena 02/2016 for pregnancy prevention. We discussed FDA approval for 7 years and she is agreeable.   Screening for cervical cancer - 2015 ASCUS positive HPV with negative C & B, 2019 ASCUS negative HPV. Discussed current guidelines and recommendation to screen at 3-year interval. She would like a pap today.   Screening for breast cancer -11/2019 left DCIS managed with lumpectomy, radiation, and Tamoxifen for 5 year duration.  Normal breast exam today. Mammogram scheduled 10/13/2020.  Screening for colon cancer - Has not had screening colonoscopy. We discussed current guidelines and importance of preventative screenings. She plans to schedule this soon.  Return in 1 year for annual.      Tamela Gammon DNP, 2:09 PM 09/22/2020

## 2020-09-22 NOTE — Patient Instructions (Signed)
Health Maintenance, Female Adopting a healthy lifestyle and getting preventive care are important in promoting health and wellness. Ask your health care provider about:  The right schedule for you to have regular tests and exams.  Things you can do on your own to prevent diseases and keep yourself healthy. What should I know about diet, weight, and exercise? Eat a healthy diet  Eat a diet that includes plenty of vegetables, fruits, low-fat dairy products, and lean protein.  Do not eat a lot of foods that are high in solid fats, added sugars, or sodium.   Maintain a healthy weight Body mass index (BMI) is used to identify weight problems. It estimates body fat based on height and weight. Your health care provider can help determine your BMI and help you achieve or maintain a healthy weight. Get regular exercise Get regular exercise. This is one of the most important things you can do for your health. Most adults should:  Exercise for at least 150 minutes each week. The exercise should increase your heart rate and make you sweat (moderate-intensity exercise).  Do strengthening exercises at least twice a week. This is in addition to the moderate-intensity exercise.  Spend less time sitting. Even light physical activity can be beneficial. Watch cholesterol and blood lipids Have your blood tested for lipids and cholesterol at 52 years of age, then have this test every 5 years. Have your cholesterol levels checked more often if:  Your lipid or cholesterol levels are high.  You are older than 52 years of age.  You are at high risk for heart disease. What should I know about cancer screening? Depending on your health history and family history, you may need to have cancer screening at various ages. This may include screening for:  Breast cancer.  Cervical cancer.  Colorectal cancer.  Skin cancer.  Lung cancer. What should I know about heart disease, diabetes, and high blood  pressure? Blood pressure and heart disease  High blood pressure causes heart disease and increases the risk of stroke. This is more likely to develop in people who have high blood pressure readings, are of African descent, or are overweight.  Have your blood pressure checked: ? Every 3-5 years if you are 18-39 years of age. ? Every year if you are 40 years old or older. Diabetes Have regular diabetes screenings. This checks your fasting blood sugar level. Have the screening done:  Once every three years after age 40 if you are at a normal weight and have a low risk for diabetes.  More often and at a younger age if you are overweight or have a high risk for diabetes. What should I know about preventing infection? Hepatitis B If you have a higher risk for hepatitis B, you should be screened for this virus. Talk with your health care provider to find out if you are at risk for hepatitis B infection. Hepatitis C Testing is recommended for:  Everyone born from 1945 through 1965.  Anyone with known risk factors for hepatitis C. Sexually transmitted infections (STIs)  Get screened for STIs, including gonorrhea and chlamydia, if: ? You are sexually active and are younger than 52 years of age. ? You are older than 52 years of age and your health care provider tells you that you are at risk for this type of infection. ? Your sexual activity has changed since you were last screened, and you are at increased risk for chlamydia or gonorrhea. Ask your health care provider   if you are at risk.  Ask your health care provider about whether you are at high risk for HIV. Your health care provider may recommend a prescription medicine to help prevent HIV infection. If you choose to take medicine to prevent HIV, you should first get tested for HIV. You should then be tested every 3 months for as long as you are taking the medicine. Pregnancy  If you are about to stop having your period (premenopausal) and  you may become pregnant, seek counseling before you get pregnant.  Take 400 to 800 micrograms (mcg) of folic acid every day if you become pregnant.  Ask for birth control (contraception) if you want to prevent pregnancy. Osteoporosis and menopause Osteoporosis is a disease in which the bones lose minerals and strength with aging. This can result in bone fractures. If you are 65 years old or older, or if you are at risk for osteoporosis and fractures, ask your health care provider if you should:  Be screened for bone loss.  Take a calcium or vitamin D supplement to lower your risk of fractures.  Be given hormone replacement therapy (HRT) to treat symptoms of menopause. Follow these instructions at home: Lifestyle  Do not use any products that contain nicotine or tobacco, such as cigarettes, e-cigarettes, and chewing tobacco. If you need help quitting, ask your health care provider.  Do not use street drugs.  Do not share needles.  Ask your health care provider for help if you need support or information about quitting drugs. Alcohol use  Do not drink alcohol if: ? Your health care provider tells you not to drink. ? You are pregnant, may be pregnant, or are planning to become pregnant.  If you drink alcohol: ? Limit how much you use to 0-1 drink a day. ? Limit intake if you are breastfeeding.  Be aware of how much alcohol is in your drink. In the [place] one drink equals one 12 oz bottle of beer (355 mL), one 5 oz glass of wine (148 mL), or one 1 oz glass of hard liquor (44 mL). General instructions  Schedule regular health, dental, and eye exams.  Stay current with your vaccines.  Tell your health care provider if: ? You often feel depressed. ? You have ever been abused or do not feel safe at home. Summary  Adopting a healthy lifestyle and getting preventive care are important in promoting health and wellness.  Follow your health care provider's instructions about healthy  diet, exercising, and getting tested or screened for diseases.  Follow your health care provider's instructions on monitoring your cholesterol and blood pressure. This information is not intended to replace advice given to you by your health care provider. Make sure you discuss any questions you have with your health care provider. Document Revised: 06/18/2018 Document Reviewed: 06/18/2018 Elsevier Patient Education  2021 [employer]  

## 2020-09-23 LAB — LIPID PANEL
Cholesterol: 193 mg/dL (ref <200)
HDL: 43 mg/dL — ABNORMAL LOW (ref >=50)
LDL Cholesterol (Calc): 125 mg/dL (calc) — ABNORMAL HIGH
Non-HDL Cholesterol (Calc): 150 mg/dL (calc) — ABNORMAL HIGH (ref <130)
Total CHOL/HDL Ratio: 4.5 (calc) (ref <5.0)
Triglycerides: 133 mg/dL (ref <150)

## 2020-09-23 LAB — COMPREHENSIVE METABOLIC PANEL
AG Ratio: 2 (calc) (ref 1.0–2.5)
ALT: 24 U/L (ref 6–29)
AST: 16 U/L (ref 10–35)
Albumin: 4.5 g/dL (ref 3.6–5.1)
Alkaline phosphatase (APISO): 60 U/L (ref 37–153)
BUN: 19 mg/dL (ref 7–25)
CO2: 26 mmol/L (ref 20–32)
Calcium: 9.4 mg/dL (ref 8.6–10.4)
Chloride: 104 mmol/L (ref 98–110)
Creat: 0.72 mg/dL (ref 0.50–1.05)
Globulin: 2.2 g/dL (calc) (ref 1.9–3.7)
Glucose, Bld: 79 mg/dL (ref 65–99)
Potassium: 3.9 mmol/L (ref 3.5–5.3)
Sodium: 140 mmol/L (ref 135–146)
Total Bilirubin: 0.7 mg/dL (ref 0.2–1.2)
Total Protein: 6.7 g/dL (ref 6.1–8.1)

## 2020-09-23 LAB — CBC WITH DIFFERENTIAL/PLATELET
Absolute Monocytes: 520 cells/uL (ref 200–950)
Basophils Absolute: 59 cells/uL (ref 0–200)
Basophils Relative: 0.9 %
Eosinophils Absolute: 137 cells/uL (ref 15–500)
Eosinophils Relative: 2.1 %
HCT: 41 % (ref 35.0–45.0)
Hemoglobin: 14.1 g/dL (ref 11.7–15.5)
Lymphs Abs: 1872 cells/uL (ref 850–3900)
MCH: 32.8 pg (ref 27.0–33.0)
MCHC: 34.4 g/dL (ref 32.0–36.0)
MCV: 95.3 fL (ref 80.0–100.0)
MPV: 11.4 fL (ref 7.5–12.5)
Monocytes Relative: 8 %
Neutro Abs: 3913 cells/uL (ref 1500–7800)
Neutrophils Relative %: 60.2 %
Platelets: 232 10*3/uL (ref 140–400)
RBC: 4.3 10*6/uL (ref 3.80–5.10)
RDW: 12.3 % (ref 11.0–15.0)
Total Lymphocyte: 28.8 %
WBC: 6.5 10*3/uL (ref 3.8–10.8)

## 2020-09-23 LAB — PAP IG W/ RFLX HPV ASCU

## 2020-09-23 LAB — HEMOGLOBIN A1C
Hgb A1c MFr Bld: 5.3 % of total Hgb (ref <5.7)
Mean Plasma Glucose: 105 mg/dL
eAG (mmol/L): 5.8 mmol/L

## 2020-10-13 ENCOUNTER — Other Ambulatory Visit: Payer: Self-pay

## 2020-10-13 ENCOUNTER — Ambulatory Visit
Admission: RE | Admit: 2020-10-13 | Discharge: 2020-10-13 | Disposition: A | Discharge status: Home / Self Care | Payer: BC Managed Care – PPO | Source: Ambulatory Visit | Attending: Adult Health | Admitting: Adult Health

## 2020-10-13 DIAGNOSIS — D0512 Intraductal carcinoma in situ of left breast: Secondary | ICD-10-CM

## 2020-10-13 HISTORY — DX: Personal history of irradiation: Z92.3

## 2020-10-17 ENCOUNTER — Other Ambulatory Visit: Payer: Self-pay | Admitting: Hematology and Oncology

## 2020-11-28 NOTE — Progress Notes (Incomplete)
Patient Care Team: Celene Squibb, MD as PCP - General (Internal Medicine) Satira Sark, MD as PCP - Cardiology (Cardiology) Jovita Kussmaul, MD as Surgeon (General Surgery) Nicholas Lose, MD as Medical Oncologist (Hematology and Oncology) Eppie Gibson, MD as Radiation Oncologist (Radiation Oncology)  DIAGNOSIS: No diagnosis found.  SUMMARY OF ONCOLOGIC HISTORY: Oncology History  Ductal carcinoma in situ (DCIS) of left breast  11/04/2019 Initial Diagnosis   Screening mammogram on 10/12/19 detected a possible left breast mass. Diagnostic mammogram and [place] on 10/27/19 showed a 0.6cm mass at the 8:30 position in the left breast. Biopsy on 11/04/19 showed ductal carcinoma in situ with calcifications partially involving an intraductal papilloma, low grade, ER/PR+ 100%.    01/29/2020 Surgery   Bilateral lumpectomies [name]) [phone]): left breast: low grade DCIS involving papilloma, clear margins; right breast: no evidence of carcinoma. No regional lymph nodes were examined.   02/01/2020 Cancer Staging   Staging form: Breast, AJCC 8th Edition - Pathologic stage from 02/01/2020: Stage 0 (pTis (DCIS), pN0, cM0, ER+, PR+)   03/07/2020 - 04/01/2020 Radiation Therapy   The patient initially received a dose of 42.56 Gy in 16 fractions to the breast using whole-breast tangent fields. This was delivered using a 3-D conformal technique. The total dose was 42.56 Gy.   06/2020 - 06/2025 Anti-estrogen oral therapy   Tamoxifen     CHIEF COMPLIANT: Follow-up to discuss antiestrogen therapy  INTERVAL HISTORY: Pamela Martinez is a 50 y.o. with above-mentioned history of left breast DCIS who underwent bilateral lumpectomies, radiation, and is currently on antiestrogen therapy with tamoxifen. Mammogram on 10/13/20 showed no evidence of malignancy bilaterally. She presents to the clinic today for follow-up.   ALLERGIES:  is allergic to diflucan [fluconazole], mixed ragweed, and sulfa  antibiotics.  MEDICATIONS:  Current Outpatient Medications  Medication Sig Dispense Refill  . albuterol (VENTOLIN HFA) 108 (90 Base) MCG/ACT inhaler Inhale 1-2 puffs into the lungs every 6 (six) hours as needed for wheezing or shortness of breath. 18 g 0  . ALPRAZolam (XANAX) 0.5 MG tablet Take 0.5 mg by mouth at bedtime as needed for anxiety.    . cetirizine (ZYRTEC) 10 MG tablet Take 10 mg by mouth daily.    . Cholecalciferol (VITAMIN D) 50 MCG (2000 UT) tablet Take 2,000 Units by mouth daily.     Marland Kitchen MAGNESIUM PO Take 400 mg by mouth daily.    . Multiple Vitamins-Minerals (MULTIVITAMIN WITH MINERALS) tablet Take 1 tablet by mouth daily.    . sertraline (ZOLOFT) 50 MG tablet Take 50 mg by mouth daily.    . tamoxifen (NOLVADEX) 20 MG tablet TAKE ONE TABLET BY MOUTH ONCE DAILY. 90 tablet 0  . thiamine (VITAMIN B-1) 50 MG tablet Take 50 mg by mouth daily.     Current Facility-Administered Medications  Medication Dose Route Frequency Provider Last Rate Last Admin  . levonorgestrel (MIRENA) 20 MCG/24HR IUD   Intrauterine Once Fontaine, Belinda Block, MD        PHYSICAL EXAMINATION: ECOG PERFORMANCE STATUS: {CHL ONC ECOG EP:[id number]}  There were no vitals filed for this visit. There were no vitals filed for this visit.  BREAST:*** No palpable masses or nodules in either right or left breasts. No palpable axillary supraclavicular or infraclavicular adenopathy no breast tenderness or nipple discharge. (exam performed in the presence of a chaperone)  LABORATORY DATA:  I have reviewed the data as listed CMP Latest Ref Rng & Units 09/22/2020 04/14/2020 09/10/2018  Glucose 65 - 99  mg/dL 79 94 78  BUN 7 - 25 mg/dL 19 12 15   Creatinine 0.50 - 1.05 mg/dL 0.72 0.66 0.68  Sodium 135 - 146 mmol/L 140 137 139  Potassium 3.5 - 5.3 mmol/L 3.9 3.6 3.7  Chloride 98 - 110 mmol/L 104 106 105  CO2 20 - 32 mmol/L 26 23 26   Calcium 8.6 - 10.4 mg/dL 9.4 9.4 9.4  Total Protein 6.1 - 8.1 g/dL 6.7 - 6.4  Total  Bilirubin 0.2 - 1.2 mg/dL 0.7 - 0.8  Alkaline Phos 33 - 115 U/L - - -  AST 10 - 35 U/L 16 - 15  ALT 6 - 29 U/L 24 - 23    Lab Results  Component Value Date   WBC 6.5 09/22/2020   HGB 14.1 09/22/2020   HCT 41.0 09/22/2020   MCV 95.3 09/22/2020   PLT 232 09/22/2020   NEUTROABS 3,913 09/22/2020    ASSESSMENT & PLAN:  No problem-specific Assessment & Plan notes found for this encounter.    No orders of the defined types were placed in this encounter.  The patient has a good understanding of the overall plan. she agrees with it. she will call with any problems that may develop before the next visit here.  Total time spent: *** mins including face to face time and time spent for planning, charting and coordination of care  Rulon Eisenmenger, MD, MPH 11/28/2020  I, Molly Dorshimer, am acting as scribe for Dr. Nicholas Lose.  {insert scribe attestation}

## 2020-11-29 ENCOUNTER — Inpatient Hospital Stay: Payer: BC Managed Care – PPO | Admitting: Hematology and Oncology

## 2020-11-29 NOTE — Assessment & Plan Note (Deleted)
01/29/20:Bilateral lumpectomies [name]): Left breast: low grade DCIS involving papilloma, clear marginsER/PR 100%; Right breast: no evidence of carcinoma  Treatment plan: 1.Adjuvant radiation therapy 03/08/2020-04/05/2020 2.followed by adjuvant antiestrogen therapy with tamoxifen x5 years  Tamoxifen toxicities:  Breast cancer surveillance: 1.  Breast exam 11/29/2020: Benign 2. Mammogram 10/13/2020: Benign breast density category C  Return to clinic in 1 year for follow-up

## 2020-12-01 ENCOUNTER — Encounter: Payer: Self-pay | Admitting: Nurse Practitioner

## 2020-12-02 NOTE — Telephone Encounter (Signed)
Patient would like appointment with Tiffany next Tuesday if possible.

## 2020-12-06 ENCOUNTER — Ambulatory Visit: Payer: BC Managed Care – PPO | Admitting: Nurse Practitioner

## 2020-12-06 ENCOUNTER — Other Ambulatory Visit: Payer: Self-pay

## 2020-12-06 DIAGNOSIS — N939 Abnormal uterine and vaginal bleeding, unspecified: Secondary | ICD-10-CM | POA: Diagnosis not present

## 2020-12-06 MED ORDER — MEGESTROL ACETATE 40 MG PO TABS: 40.0000 mg | ORAL_TABLET | Freq: Two times a day (BID) | ORAL | 0 refills | Status: AC

## 2020-12-06 NOTE — Progress Notes (Signed)
   Acute Office Visit  Subjective:    Patient ID: Pamela Martinez, female    DOB: 09-28-1970, 50 y.o.   MRN: [id number]   HPI 50 y.o. presents today for prolonged vaginal bleeding. Bleeding began 09/26/2020 and has occurred daily ranging from light to heavy. Prior to this she had occasional spotting. Mirena inserted 02/2016. She is on Tamoxifen for breast cancer treatment since 03/2020.   Review of Systems  Constitutional: Negative.   Genitourinary: Positive for menstrual problem.       Objective:    Physical Exam Genitourinary:    General: Normal vulva.     Vagina: Normal.     Cervix: Normal.     Uterus: Normal.      Comments: IUD string visible about 2 cm Neurological:     Mental Status: She is alert.     There were no vitals taken for this visit. Wt Readings from Last 3 Encounters:  09/22/20 185 lb (83.9 kg)  04/14/20 180 lb (81.6 kg)  04/13/20 180 lb (81.6 kg)        Assessment & Plan:   Problem List Items Addressed This Visit   None   Visit Diagnoses    Abnormal uterine bleeding    -  Primary   Relevant Medications   megestrol (MEGACE) 40 MG tablet     Plan: Reassurance provided that IUD appears to be in correct position. Megace 40 mg twice daily x 10 days to stop bleeding. If prolonged bleeding returns we discussed exchanging IUD. Abnormal bleeding may be from Tamoxifen use. She is agreeable to plan.      Pound, 4:03 PM 12/06/2020

## 2020-12-08 NOTE — Progress Notes (Signed)
Patient Care Team: Celene Squibb, MD as PCP - General (Internal Medicine) Satira Sark, MD as PCP - Cardiology (Cardiology) Jovita Kussmaul, MD as Surgeon (General Surgery) Nicholas Lose, MD as Medical Oncologist (Hematology and Oncology) Eppie Gibson, MD as Radiation Oncologist (Radiation Oncology)  DIAGNOSIS:    ICD-10-CM   1. Ductal carcinoma in situ (DCIS) of left breast  D05.12     SUMMARY OF ONCOLOGIC HISTORY: Oncology History  Ductal carcinoma in situ (DCIS) of left breast  11/04/2019 Initial Diagnosis   Screening mammogram on 10/12/19 detected a possible left breast mass. Diagnostic mammogram and [place] on 10/27/19 showed a 0.6cm mass at the 8:30 position in the left breast. Biopsy on 11/04/19 showed ductal carcinoma in situ with calcifications partially involving an intraductal papilloma, low grade, ER/PR+ 100%.    01/29/2020 Surgery   Bilateral lumpectomies [name]) [phone]): left breast: low grade DCIS involving papilloma, clear margins; right breast: no evidence of carcinoma. No regional lymph nodes were examined.   02/01/2020 Cancer Staging   Staging form: Breast, AJCC 8th Edition - Pathologic stage from 02/01/2020: Stage 0 (pTis (DCIS), pN0, cM0, ER+, PR+)   03/07/2020 - 04/01/2020 Radiation Therapy   The patient initially received a dose of 42.56 Gy in 16 fractions to the breast using whole-breast tangent fields. This was delivered using a 3-D conformal technique. The total dose was 42.56 Gy.   06/2020 - 06/2025 Anti-estrogen oral therapy   Tamoxifen     CHIEF COMPLIANT: Follow-up of left breast DCIS  INTERVAL HISTORY: Pamela Martinez is a 50 y.o. with above-mentioned history of left breast DCIS who underwent bilateral lumpectomies, radiation, and is currently on antiestrogen therapy with tamoxifen. Mammogram on 10/13/20 showed no evidence of malignancy bilaterally. She presents to the clinic today for follow-up.  Her major complaint today is a brownish vaginal  discharge as well as heavy uterine bleeding.  The symptoms have started since she started back on tamoxifen.  ALLERGIES:  is allergic to diflucan [fluconazole], mixed ragweed, and sulfa antibiotics.  MEDICATIONS:  Current Outpatient Medications  Medication Sig Dispense Refill  . albuterol (VENTOLIN HFA) 108 (90 Base) MCG/ACT inhaler Inhale 1-2 puffs into the lungs every 6 (six) hours as needed for wheezing or shortness of breath. 18 g 0  . ALPRAZolam (XANAX) 0.5 MG tablet Take 0.5 mg by mouth at bedtime as needed for anxiety.    . cetirizine (ZYRTEC) 10 MG tablet Take 10 mg by mouth daily.    . Cholecalciferol (VITAMIN D) 50 MCG (2000 UT) tablet Take 2,000 Units by mouth daily.     Marland Kitchen MAGNESIUM PO Take 400 mg by mouth daily.    . megestrol (MEGACE) 40 MG tablet Take 1 tablet (40 mg total) by mouth 2 (two) times daily for 10 days. 20 tablet 0  . Multiple Vitamins-Minerals (MULTIVITAMIN WITH MINERALS) tablet Take 1 tablet by mouth daily.    . sertraline (ZOLOFT) 50 MG tablet Take 50 mg by mouth daily.    . tamoxifen (NOLVADEX) 20 MG tablet TAKE ONE TABLET BY MOUTH ONCE DAILY. 90 tablet 0  . thiamine (VITAMIN B-1) 50 MG tablet Take 50 mg by mouth daily.     Current Facility-Administered Medications  Medication Dose Route Frequency Provider Last Rate Last Admin  . levonorgestrel (MIRENA) 20 MCG/24HR IUD   Intrauterine Once Fontaine, Belinda Block, MD        PHYSICAL EXAMINATION: ECOG PERFORMANCE STATUS: 1 - Symptomatic but completely ambulatory  Vitals:   12/09/20 0854  BP: 126/72  Pulse: 60  Resp: 18  Temp: 97.7 F (36.5 C)  SpO2: 100%   Filed Weights   12/09/20 0854  Weight: 186 lb 8 oz (84.6 kg)    BREAST: No palpable masses or nodules in either right or left breasts. No palpable axillary supraclavicular or infraclavicular adenopathy no breast tenderness or nipple discharge. (exam performed in the presence of a chaperone)  LABORATORY DATA:  I have reviewed the data as listed CMP  Latest Ref Rng & Units 09/22/2020 04/14/2020 09/10/2018  Glucose 65 - 99 mg/dL 79 94 78  BUN 7 - 25 mg/dL 19 12 15   Creatinine 0.50 - 1.05 mg/dL 0.72 0.66 0.68  Sodium 135 - 146 mmol/L 140 137 139  Potassium 3.5 - 5.3 mmol/L 3.9 3.6 3.7  Chloride 98 - 110 mmol/L 104 106 105  CO2 20 - 32 mmol/L 26 23 26   Calcium 8.6 - 10.4 mg/dL 9.4 9.4 9.4  Total Protein 6.1 - 8.1 g/dL 6.7 - 6.4  Total Bilirubin 0.2 - 1.2 mg/dL 0.7 - 0.8  Alkaline Phos 33 - 115 U/L - - -  AST 10 - 35 U/L 16 - 15  ALT 6 - 29 U/L 24 - 23    Lab Results  Component Value Date   WBC 6.5 09/22/2020   HGB 14.1 09/22/2020   HCT 41.0 09/22/2020   MCV 95.3 09/22/2020   PLT 232 09/22/2020   NEUTROABS 3,913 09/22/2020    ASSESSMENT & PLAN:  Ductal carcinoma in situ (DCIS) of left breast 01/29/20:Bilateral lumpectomies [name]): Left breast: low grade DCIS involving papilloma, clear marginsER/PR 100%; Right breast: no evidence of carcinoma  Treatment plan: 1.Adjuvant radiation therapy 03/08/2020-04/05/2020 2.followed by adjuvant antiestrogen therapy with tamoxifen x5 years  Tamoxifen toxicities: Profound vaginal discharge of brownish material Heavy menstrual bleed lasting for more than a month She is following with gynecology.  This started on progesterone. I recommended stopping tamoxifen at this time. We will reassess her in 6 months and decide if we can go back on a low-dose of tamoxifen.  I discussed with her about TAM-01 clinical trial where the use of 5 mg of tamoxifen it was still found to be beneficial.  Breast cancer surveillance: 1.  Breast exam 08/11/2020: Benign 2. Mammogram 10/13/2020: Benign breast density category C  Return to clinic in 6 months for follow-up to assess restarting tamoxifen at a lower dose.    No orders of the defined types were placed in this encounter.  The patient has a good understanding of the overall plan. she agrees with it. she will call with any problems that may develop  before the next visit here.  Total time spent: 20 mins including face to face time and time spent for planning, charting and coordination of care  Rulon Eisenmenger, MD, MPH 12/09/2020  I, Cloyde Reams Dorshimer, am acting as scribe for Dr. Nicholas Lose.  I have reviewed the above documentation for accuracy and completeness, and I agree with the above.

## 2020-12-09 ENCOUNTER — Inpatient Hospital Stay: Payer: BC Managed Care – PPO | Attending: Hematology and Oncology | Admitting: Hematology and Oncology

## 2020-12-09 ENCOUNTER — Telehealth: Payer: Self-pay | Admitting: Hematology and Oncology

## 2020-12-09 ENCOUNTER — Other Ambulatory Visit: Payer: Self-pay

## 2020-12-09 DIAGNOSIS — Z79810 Long term (current) use of selective estrogen receptor modulators (SERMs): Secondary | ICD-10-CM | POA: Insufficient documentation

## 2020-12-09 DIAGNOSIS — Z923 Personal history of irradiation: Secondary | ICD-10-CM | POA: Insufficient documentation

## 2020-12-09 DIAGNOSIS — Z793 Long term (current) use of hormonal contraceptives: Secondary | ICD-10-CM | POA: Insufficient documentation

## 2020-12-09 DIAGNOSIS — D0512 Intraductal carcinoma in situ of left breast: Secondary | ICD-10-CM | POA: Diagnosis not present

## 2020-12-09 NOTE — Telephone Encounter (Signed)
Scheduled appointment per 06/03 los. Patient is aware.

## 2020-12-09 NOTE — Assessment & Plan Note (Signed)
01/29/20:Bilateral lumpectomies [name]): Left breast: low grade DCIS involving papilloma, clear marginsER/PR 100%; Right breast: no evidence of carcinoma  Treatment plan: 1.Adjuvant radiation therapy 03/08/2020-04/05/2020 2.followed by adjuvant antiestrogen therapy with tamoxifen x5 years  Tamoxifen toxicities:  Breast cancer surveillance: 1.  Breast exam 08/11/2020: Benign 2. Mammogram 10/13/2020: Benign breast density category C  Return to clinic in 1 year for follow-up

## 2021-01-27 ENCOUNTER — Encounter (HOSPITAL_COMMUNITY): Payer: Self-pay

## 2021-02-06 DIAGNOSIS — Z8616 Personal history of COVID-19: Secondary | ICD-10-CM

## 2021-02-06 HISTORY — DX: Personal history of COVID-19: Z86.16

## 2021-04-09 ENCOUNTER — Encounter (HOSPITAL_COMMUNITY): Payer: Self-pay

## 2021-04-09 ENCOUNTER — Other Ambulatory Visit: Payer: Self-pay

## 2021-04-09 ENCOUNTER — Emergency Department (HOSPITAL_COMMUNITY)
Admission: EM | Admit: 2021-04-09 | Discharge: 2021-04-09 | Disposition: A | Discharge status: Home / Self Care | Payer: BC Managed Care – PPO | Attending: Emergency Medicine | Admitting: Emergency Medicine

## 2021-04-09 DIAGNOSIS — J45909 Unspecified asthma, uncomplicated: Secondary | ICD-10-CM | POA: Insufficient documentation

## 2021-04-09 DIAGNOSIS — Z23 Encounter for immunization: Secondary | ICD-10-CM | POA: Diagnosis not present

## 2021-04-09 DIAGNOSIS — S61551A Open bite of right wrist, initial encounter: Secondary | ICD-10-CM | POA: Diagnosis not present

## 2021-04-09 DIAGNOSIS — Z2914 Encounter for prophylactic rabies immune globin: Secondary | ICD-10-CM | POA: Insufficient documentation

## 2021-04-09 DIAGNOSIS — Z87891 Personal history of nicotine dependence: Secondary | ICD-10-CM | POA: Insufficient documentation

## 2021-04-09 DIAGNOSIS — W5501XA Bitten by cat, initial encounter: Secondary | ICD-10-CM | POA: Insufficient documentation

## 2021-04-09 DIAGNOSIS — S6991XA Unspecified injury of right wrist, hand and finger(s), initial encounter: Secondary | ICD-10-CM | POA: Diagnosis present

## 2021-04-09 MED ORDER — AMOXICILLIN-POT CLAVULANATE 875-125 MG PO TABS
1.0000 | ORAL_TABLET | Freq: Once | ORAL | Status: AC
  Administered 2021-04-09: 1 via ORAL
  Filled 2021-04-09: qty 1

## 2021-04-09 MED ORDER — RABIES VACCINE, PCEC IM SUSR
1.0000 mL | Freq: Once | INTRAMUSCULAR | Status: AC
  Administered 2021-04-09: 1 mL via INTRAMUSCULAR
  Filled 2021-04-09: qty 1

## 2021-04-09 MED ORDER — TETANUS-DIPHTH-ACELL PERTUSSIS 5-2.5-18.5 LF-MCG/0.5 IM SUSY
0.5000 mL | PREFILLED_SYRINGE | Freq: Once | INTRAMUSCULAR | Status: AC
  Administered 2021-04-09: 0.5 mL via INTRAMUSCULAR
  Filled 2021-04-09: qty 0.5

## 2021-04-09 MED ORDER — RABIES IMMUNE GLOBULIN 150 UNIT/ML IM INJ
20.0000 [IU]/kg | INJECTION | Freq: Once | INTRAMUSCULAR | Status: AC
  Administered 2021-04-09: 1725 [IU] via INTRAMUSCULAR
  Filled 2021-04-09: qty 20

## 2021-04-09 MED ORDER — AMOXICILLIN-POT CLAVULANATE 875-125 MG PO TABS: 1.0000 | ORAL_TABLET | Freq: Two times a day (BID) | ORAL | 0 refills | Status: DC

## 2021-04-09 NOTE — ED Provider Notes (Signed)
[facility] EMERGENCY DEPARTMENT Provider Note   CSN: [id number] Arrival date & time: 04/09/21  1404     History No chief complaint on file.   Pamela Martinez is a 50 y.o. female.  Patient c/o cat bite to dorsum right wrist yesterday. Symptoms acute onset, mild- moderate, persistent, dull pain to area. Animal is a stray - imm status unknown. Today pt with mild erythema about bite marks. No pain w rom digits. No redness or pain up forearm or arm. Was seen at urgent care and sent to ED. Overall pt does not feel sick or ill. No nausea/vomiting. No fever/chills. No numbness/weakness. Right hand dominant.   The history is provided by the patient and medical records.      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, 02/2014   Colposcopy with ECC showed KA 2015  recommend followup Pap smear/HPV at her annual 2016    Asthma    no inhalers   Ductal carcinoma in situ (DCIS) of left breast    Joint pain    Migraine    Neurocardiogenic syncope    Personal history of radiation therapy    PONV (postoperative nausea and vomiting)    Restless leg syndrome     Patient Active Problem List   Diagnosis Date Noted   Ductal carcinoma in situ (DCIS) of left breast 11/24/2019   IUD (intrauterine device) in place 02/23/2016   Mild dysplasia of cervix 03/17/2012    Past Surgical History:  Procedure Laterality Date   BREAST EXCISIONAL BIOPSY     BREAST LUMPECTOMY     BREAST LUMPECTOMY WITH RADIOACTIVE SEED LOCALIZATION Bilateral 01/29/2020   Procedure: BILATERAL BREAST LUMPECTOMY WITH RADIOACTIVE SEED LOCALIZATION;  Surgeon: Jovita Kussmaul, MD;  Location: [facility] OR;  Service: General;  Laterality: Bilateral;   INTRAUTERINE DEVICE INSERTION  02/10/2016   Mirena   KNEE SURGERY     X 4 = 1X RIGHT 3X LEFT/3 ARTHROSCOPIES AND 1 ACL   KNEE SURGERY     ACL   MM BREAST STEREO BIOPSY LEFT ([facility] HX) Left    WISDOM TOOTH EXTRACTION        OB History     Gravida  2   Para  2   Term      Preterm      AB  0   Living  2      SAB      IAB      Ectopic  0   Multiple      Live Births              Family History  Problem Relation Age of Onset   COPD Father    CAD Father    Cancer Paternal Grandfather        COLON   Hyperlipidemia Brother    Hypertension Brother    Stroke Maternal Grandmother    Hyperlipidemia Brother    Cancer Paternal Aunt        PRIMARY PERITONEAL CANCER    Social History   Tobacco Use   Smoking status: Former    Types: Cigarettes    Quit date: 07/09/1988    Years since quitting: 32.7   Smokeless tobacco: Never  Vaping Use   Vaping Use: Never used  Substance Use Topics   Alcohol use: Yes    Comment: 1 time a month   Drug use: No    Home  Medications Prior to Admission medications   Medication Sig Start Date End Date Taking? Authorizing Provider  albuterol (VENTOLIN HFA) 108 (90 Base) MCG/ACT inhaler Inhale 1-2 puffs into the lungs every 6 (six) hours as needed for wheezing or shortness of breath. 04/13/20   [place], [place], PA-C  ALPRAZolam Duanne Moron) 0.5 MG tablet Take 0.5 mg by mouth at bedtime as needed for anxiety.    [provider]  cetirizine (ZYRTEC) 10 MG tablet Take 10 mg by mouth daily.    [provider]  Cholecalciferol (VITAMIN D) 50 MCG (2000 UT) tablet Take 2,000 Units by mouth daily.     [provider]  MAGNESIUM PO Take 400 mg by mouth daily.    [provider]  Multiple Vitamins-Minerals (MULTIVITAMIN WITH MINERALS) tablet Take 1 tablet by mouth daily.    [provider]  sertraline (ZOLOFT) 50 MG tablet Take 50 mg by mouth daily. 09/17/19   [provider]  tamoxifen (NOLVADEX) 20 MG tablet TAKE ONE TABLET BY MOUTH ONCE DAILY. 10/17/20   Nicholas Lose, MD  thiamine (VITAMIN B-1) 50 MG tablet Take 50 mg by mouth daily.    [provider]  levonorgestrel (MIRENA) 20 MCG/24HR IUD 1 each by  Intrauterine route once. INSERTED 05-01-06.   09/02/17  [provider]    Allergies    Diflucan [fluconazole], Mixed ragweed, and Sulfa antibiotics  Review of Systems   Review of Systems  Constitutional:  Negative for fever.  HENT:  Negative for rhinorrhea.   Eyes:  Negative for redness.  Respiratory:  Negative for shortness of breath.   Cardiovascular:  Negative for chest pain.  Gastrointestinal:  Negative for abdominal pain.  Genitourinary:  Negative for flank pain.  Musculoskeletal:        Cat bite to right wrist.   Skin:  Negative for rash.  Neurological:  Negative for numbness.  Hematological:  Does not bruise/bleed easily.  Psychiatric/Behavioral:  Negative for confusion.    Physical Exam Updated Vital Signs BP 129/79 (BP Location: Right Arm)   Pulse 75   Temp 99.1 F (37.3 C) (Oral)   Resp 15   Wt 84.4 kg   SpO2 100%   BMI 31.45 kg/m   Physical Exam Vitals and nursing note reviewed.  Constitutional:      Appearance: Normal appearance. She is well-developed.  HENT:     Head: Atraumatic.     Nose: Nose normal.     Mouth/Throat:     Mouth: Mucous membranes are moist.  Eyes:     General: No scleral icterus.    Conjunctiva/sclera: Conjunctivae normal.  Neck:     Trachea: No tracheal deviation.  Cardiovascular:     Rate and Rhythm: Normal rate.     Pulses: Normal pulses.  Pulmonary:     Effort: Pulmonary effort is normal. No respiratory distress.  Abdominal:     General: There is no distension.  Genitourinary:    Comments: No cva tenderness.  Musculoskeletal:     Cervical back: Neck supple. No muscular tenderness.     Comments: Bite mark to dorsum right wrist. Mild surrounding erythema. No crepitus. No devitalized tissue. No pus from wound. No pain w rom digits, no tenosynovitis. No lymphangitis. No RUE l/a. No cellulitis up forearm. No pain w rom wrist. No fb seen/felt. No significant swelling noted. Radial pulse 2+.   Skin:    General: Skin is  warm and dry.     Findings: No rash.  Neurological:  Mental Status: She is alert.     Comments: Alert, speech normal. Right hand nvi w intact motor/sens fxn.   Psychiatric:        Mood and Affect: Mood normal.    ED Results / Procedures / Treatments   Labs (all labs ordered are listed, but only abnormal results are displayed) Labs Reviewed - No data to display  EKG None  Radiology No results found.  Procedures Procedures   Medications Ordered in ED Medications  rabies immune globulin (HYPERAB/KEDRAB) injection 1,725 Units (has no administration in time range)  rabies vaccine (RABAVERT) injection 1 mL (has no administration in time range)  Tdap (BOOSTRIX) injection 0.5 mL (has no administration in time range)  amoxicillin-clavulanate (AUGMENTIN) 875-125 MG per tablet 1 tablet (has no administration in time range)    ED Course  I have reviewed the triage vital signs and the nursing notes.  Pertinent labs & imaging results that were available during my care of the patient were reviewed by me and considered in my medical decision making (see chart for details).    MDM Rules/Calculators/A&P                           Allergies reviewed/confirmed w pt.   Wound cleaned, sterile dressing.   Augmentin po. Tetanus im. Rabies IG and vaccine.   Rx augmentin for home. Pt to complete vaccine series.    Final Clinical Impression(s) / ED Diagnoses Final diagnoses:  None    Rx / DC Orders ED Discharge Orders     None        Lajean Saver, MD 04/09/21 1826

## 2021-04-09 NOTE — ED Triage Notes (Signed)
Patient bitten by stray cat yesterday to right hand. Redness, pain and swelling noted. Patient sent from ucc for antibiotics and rabies vaccine. Patient unsure of last tetanus

## 2021-04-09 NOTE — Discharge Instructions (Addendum)
It was our pleasure to provide your ER care today - we hope that you feel better.]  Keep wound very clean - wash with soap and water 2x/day.   Take augmentin (antibiotic) as prescribed. Take acetaminophen or ibuprofen as need.   Return to [facility] for rabies vaccine series as directed, on Days 3, 7 and 14.   Return to ER if worse, new symptoms, fevers, spreading redness, severe pain/swelling, or other concern.

## 2021-04-12 ENCOUNTER — Ambulatory Visit: Payer: Self-pay

## 2021-04-12 ENCOUNTER — Other Ambulatory Visit: Payer: Self-pay

## 2021-04-12 ENCOUNTER — Ambulatory Visit
Admission: EM | Admit: 2021-04-12 | Discharge: 2021-04-12 | Disposition: A | Discharge status: Home / Self Care | Payer: BC Managed Care – PPO | Attending: Family Medicine | Admitting: Family Medicine

## 2021-04-12 DIAGNOSIS — Z203 Contact with and (suspected) exposure to rabies: Secondary | ICD-10-CM

## 2021-04-12 MED ORDER — RABIES VACCINE, PCEC IM SUSR
1.0000 mL | Freq: Once | INTRAMUSCULAR | Status: AC
  Administered 2021-04-12: 1 mL via INTRAMUSCULAR

## 2021-04-12 NOTE — ED Triage Notes (Signed)
Pt presents for rabies vaccine.  °

## 2021-04-16 ENCOUNTER — Encounter: Payer: Self-pay | Admitting: Emergency Medicine

## 2021-04-16 ENCOUNTER — Ambulatory Visit
Admission: EM | Admit: 2021-04-16 | Discharge: 2021-04-16 | Disposition: A | Discharge status: Home / Self Care | Payer: BC Managed Care – PPO | Attending: Family Medicine | Admitting: Family Medicine

## 2021-04-16 ENCOUNTER — Other Ambulatory Visit: Payer: Self-pay

## 2021-04-16 DIAGNOSIS — Z23 Encounter for immunization: Secondary | ICD-10-CM | POA: Diagnosis not present

## 2021-04-16 DIAGNOSIS — Z203 Contact with and (suspected) exposure to rabies: Secondary | ICD-10-CM | POA: Diagnosis not present

## 2021-04-16 MED ORDER — RABIES VACCINE, PCEC IM SUSR
1.0000 mL | Freq: Once | INTRAMUSCULAR | Status: AC
  Administered 2021-04-16: 1 mL via INTRAMUSCULAR

## 2021-04-16 NOTE — ED Notes (Signed)
Pt here for 3rd rabies shot

## 2021-04-23 ENCOUNTER — Ambulatory Visit
Admission: EM | Admit: 2021-04-23 | Discharge: 2021-04-23 | Disposition: A | Discharge status: Home / Self Care | Payer: BC Managed Care – PPO | Attending: Emergency Medicine | Admitting: Emergency Medicine

## 2021-04-23 ENCOUNTER — Other Ambulatory Visit: Payer: Self-pay

## 2021-04-23 DIAGNOSIS — Z203 Contact with and (suspected) exposure to rabies: Secondary | ICD-10-CM

## 2021-04-23 DIAGNOSIS — Z23 Encounter for immunization: Secondary | ICD-10-CM | POA: Diagnosis not present

## 2021-04-23 MED ORDER — RABIES VACCINE, PCEC IM SUSR
1.0000 mL | Freq: Once | INTRAMUSCULAR | Status: AC
  Administered 2021-04-23: 1 mL via INTRAMUSCULAR

## 2021-04-23 NOTE — ED Triage Notes (Signed)
Pt presents today for 4th rabies vaccine.  Patient states no issues with prior vaccinations.

## 2021-04-23 NOTE — Discharge Instructions (Signed)
Patient presents here today for her rabies vaccine #4.

## 2021-06-12 ENCOUNTER — Ambulatory Visit: Payer: BC Managed Care – PPO | Admitting: Hematology and Oncology

## 2021-06-18 NOTE — Progress Notes (Signed)
Patient Care Team: Celene Squibb, MD as PCP - General (Internal Medicine) Satira Sark, MD as PCP - Cardiology (Cardiology) Jovita Kussmaul, MD as Surgeon (General Surgery) Nicholas Lose, MD as Medical Oncologist (Hematology and Oncology) Eppie Gibson, MD as Radiation Oncologist (Radiation Oncology)  DIAGNOSIS:    ICD-10-CM   1. Ductal carcinoma in situ (DCIS) of left breast  D05.12       SUMMARY OF ONCOLOGIC HISTORY: Oncology History  Ductal carcinoma in situ (DCIS) of left breast  11/04/2019 Initial Diagnosis   Screening mammogram on 10/12/19 detected a possible left breast mass. Diagnostic mammogram and [place] on 10/27/19 showed a 0.6cm mass at the 8:30 position in the left breast. Biopsy on 11/04/19 showed ductal carcinoma in situ with calcifications partially involving an intraductal papilloma, low grade, ER/PR+ 100%.    01/29/2020 Surgery   Bilateral lumpectomies [name]) [phone]): left breast: low grade DCIS involving papilloma, clear margins; right breast: no evidence of carcinoma. No regional lymph nodes were examined.   02/01/2020 Cancer Staging   Staging form: Breast, AJCC 8th Edition - Pathologic stage from 02/01/2020: Stage 0 (pTis (DCIS), pN0, cM0, ER+, PR+)   03/07/2020 - 04/01/2020 Radiation Therapy   The patient initially received a dose of 42.56 Gy in 16 fractions to the breast using whole-breast tangent fields. This was delivered using a 3-D conformal technique. The total dose was 42.56 Gy.   06/2020 - 06/2025 Anti-estrogen oral therapy   Tamoxifen     CHIEF COMPLIANT: Follow-up of left breast DCIS  INTERVAL HISTORY: Pamela Martinez is a 50 y.o. with above-mentioned history of left breast DCIS who underwent bilateral lumpectomies, radiation, and is currently on antiestrogen therapy with tamoxifen. She presents to the clinic today for follow-up.   ALLERGIES:  is allergic to diflucan [fluconazole], mixed ragweed, and sulfa antibiotics.  MEDICATIONS:   Current Outpatient Medications  Medication Sig Dispense Refill   albuterol (VENTOLIN HFA) 108 (90 Base) MCG/ACT inhaler Inhale 1-2 puffs into the lungs every 6 (six) hours as needed for wheezing or shortness of breath. 18 g 0   ALPRAZolam (XANAX) 0.5 MG tablet Take 0.5 mg by mouth at bedtime as needed for anxiety.     amoxicillin-clavulanate (AUGMENTIN) 875-125 MG tablet Take 1 tablet by mouth 2 (two) times daily. 14 tablet 0   cetirizine (ZYRTEC) 10 MG tablet Take 10 mg by mouth daily.     Cholecalciferol (VITAMIN D) 50 MCG (2000 UT) tablet Take 2,000 Units by mouth daily.      MAGNESIUM PO Take 400 mg by mouth daily.     Multiple Vitamins-Minerals (MULTIVITAMIN WITH MINERALS) tablet Take 1 tablet by mouth daily.     sertraline (ZOLOFT) 50 MG tablet Take 50 mg by mouth daily.     tamoxifen (NOLVADEX) 20 MG tablet TAKE ONE TABLET BY MOUTH ONCE DAILY. 90 tablet 0   thiamine (VITAMIN B-1) 50 MG tablet Take 50 mg by mouth daily.     Current Facility-Administered Medications  Medication Dose Route Frequency Provider Last Rate Last Admin   levonorgestrel (MIRENA) 20 MCG/24HR IUD   Intrauterine Once Fontaine, Belinda Block, MD        PHYSICAL EXAMINATION: ECOG PERFORMANCE STATUS: 1 - Symptomatic but completely ambulatory  Vitals:   06/19/21 0848  BP: 137/76  Pulse: (!) 59  Resp: 18  Temp: (!) 97.5 F (36.4 C)  SpO2: 100%   Filed Weights   06/19/21 0848  Weight: 186 lb 3.2 oz (84.5 kg)  BREAST: No palpable masses or nodules in either right or left breasts. No palpable axillary supraclavicular or infraclavicular adenopathy no breast tenderness or nipple discharge. (exam performed in the presence of a chaperone)  LABORATORY DATA:  I have reviewed the data as listed CMP Latest Ref Rng & Units 09/22/2020 04/14/2020 09/10/2018  Glucose 65 - 99 mg/dL 79 94 78  BUN 7 - 25 mg/dL 19 12 15   Creatinine 0.50 - 1.05 mg/dL 0.72 0.66 0.68  Sodium 135 - 146 mmol/L 140 137 139  Potassium 3.5 - 5.3  mmol/L 3.9 3.6 3.7  Chloride 98 - 110 mmol/L 104 106 105  CO2 20 - 32 mmol/L 26 23 26   Calcium 8.6 - 10.4 mg/dL 9.4 9.4 9.4  Total Protein 6.1 - 8.1 g/dL 6.7 - 6.4  Total Bilirubin 0.2 - 1.2 mg/dL 0.7 - 0.8  Alkaline Phos 33 - 115 U/L - - -  AST 10 - 35 U/L 16 - 15  ALT 6 - 29 U/L 24 - 23    Lab Results  Component Value Date   WBC 6.5 09/22/2020   HGB 14.1 09/22/2020   HCT 41.0 09/22/2020   MCV 95.3 09/22/2020   PLT 232 09/22/2020   NEUTROABS 3,913 09/22/2020    ASSESSMENT & PLAN:  Ductal carcinoma in situ (DCIS) of left breast 01/29/20: Bilateral lumpectomies [name]):  Left breast: low grade DCIS involving papilloma, clear margins ER/PR 100%;  Right breast: no evidence of carcinoma   Treatment plan: 1.  Adjuvant radiation therapy 03/08/2020-04/05/2020 2. followed by adjuvant antiestrogen therapy with tamoxifen x5 years   Tamoxifen toxicities: Profound vaginal discharge of brownish material Heavy menstrual bleed lasting for more than a month  All of the symptoms have resolved 3 months after stopping tamoxifen. We discussed the pros and cons of tamoxifen at a lower dosage. She is willing to try it.  So I sent a prescription for 10 mg tamoxifen which she will break it in half and take the 5 mg dosage.  Breast cancer surveillance: 1.  Breast exam 08/11/2020: Benign 2. Mammogram 10/13/2020: Benign breast density category C   Telephone visit in 1 month to discuss tolerance to tamoxifen 5 mg dosage. If she is tolerating it well then we can see her annually for follow-ups in October.   No orders of the defined types were placed in this encounter.  The patient has a good understanding of the overall plan. she agrees with it. she will call with any problems that may develop before the next visit here.  Total time spent: 20 mins including face to face time and time spent for planning, charting and coordination of care  Rulon Eisenmenger, MD, MPH 06/19/2021  I, Thana Ates, am  acting as scribe for Dr. Nicholas Lose.  I have reviewed the above documentation for accuracy and completeness, and I agree with the above.

## 2021-06-18 NOTE — Assessment & Plan Note (Signed)
01/29/20:Bilateral lumpectomies Pamela Martinez): Left breast: low grade DCIS involving papilloma, clear marginsER/PR 100%; Right breast: no evidence of carcinoma  Treatment plan: 1.Adjuvant radiation therapy8/31/2021-04/05/2020 2.followed by adjuvant antiestrogen therapy with tamoxifen x5 years  Tamoxifen toxicities: Profound vaginal discharge of brownish material Heavy menstrual bleed lasting for more than a month She is following with gynecology.  This started on progesterone. I recommended stopping tamoxifen at this time. We will reassess her in 6 months and decide if we can go back on a low-dose of tamoxifen.  I discussed with her about TAM-01 clinical trial where the use of 5 mg of tamoxifen it was still found to be beneficial.  Breast cancer surveillance: 1.  Breast exam 08/11/2020: Benign 2. Mammogram 10/13/2020: Benign breast density category C  Return to clinic in 6 months for follow-up to assess restarting tamoxifen at a lower dose.

## 2021-06-19 ENCOUNTER — Inpatient Hospital Stay: Payer: BC Managed Care – PPO | Attending: Hematology and Oncology | Admitting: Hematology and Oncology

## 2021-06-19 ENCOUNTER — Other Ambulatory Visit: Payer: Self-pay

## 2021-06-19 DIAGNOSIS — D0512 Intraductal carcinoma in situ of left breast: Secondary | ICD-10-CM | POA: Insufficient documentation

## 2021-06-19 DIAGNOSIS — Z170 Estrogen receptor positive status [ER+]: Secondary | ICD-10-CM | POA: Diagnosis not present

## 2021-06-19 DIAGNOSIS — Z923 Personal history of irradiation: Secondary | ICD-10-CM | POA: Diagnosis not present

## 2021-06-19 DIAGNOSIS — Z79899 Other long term (current) drug therapy: Secondary | ICD-10-CM | POA: Diagnosis not present

## 2021-06-19 DIAGNOSIS — Z793 Long term (current) use of hormonal contraceptives: Secondary | ICD-10-CM | POA: Insufficient documentation

## 2021-06-19 DIAGNOSIS — Z79810 Long term (current) use of selective estrogen receptor modulators (SERMs): Secondary | ICD-10-CM | POA: Diagnosis not present

## 2021-06-19 MED ORDER — TAMOXIFEN CITRATE 10 MG PO TABS: 5.0000 mg | ORAL_TABLET | Freq: Two times a day (BID) | ORAL | 0 refills | Status: DC

## 2021-07-02 IMAGING — MG MM PLC BREAST LOC DEV 1ST LESION INC MAMMO GUIDE*L*
7 series · 7 of 7 positions shown · non-contrast
Comparison: Previous exam(s).

CLINICAL DATA: Radioactive seed localization was requested prior to
surgical excision of biopsy-proven low-grade ductal carcinoma in
situ partially involving an intraductal papilloma left breast.

EXAM:
MAMMOGRAPHIC GUIDED RADIOACTIVE SEED LOCALIZATION OF THE LEFT BREAST

[L CC (1 of 5)]
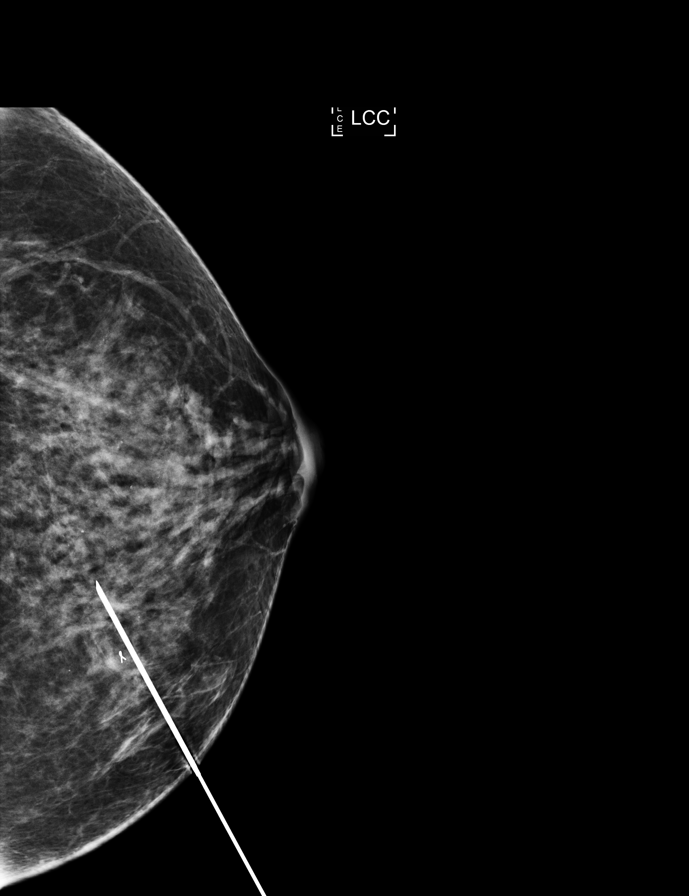

[L CC (2 of 5)]
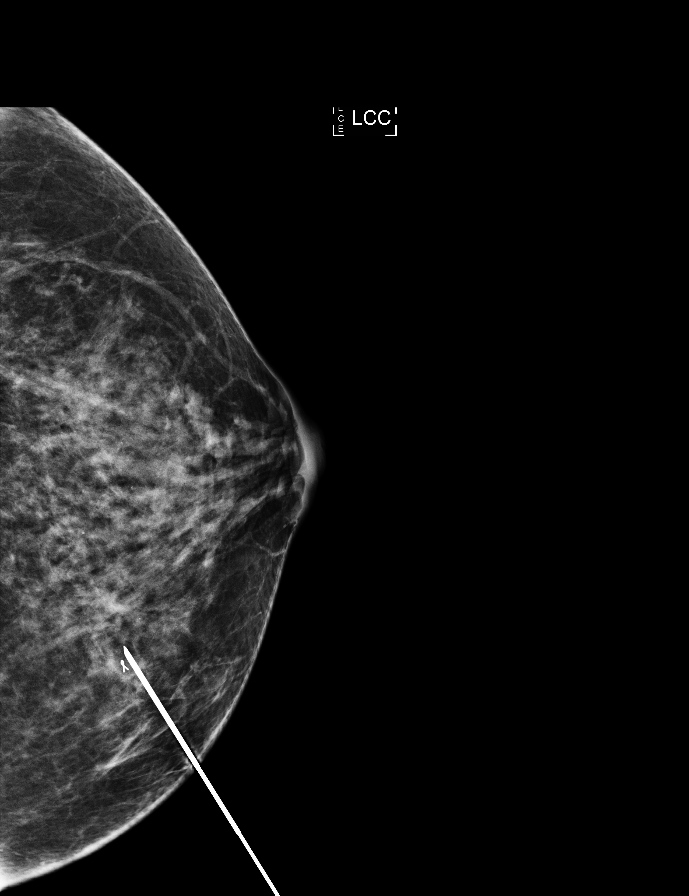

[L CC (3 of 5)]
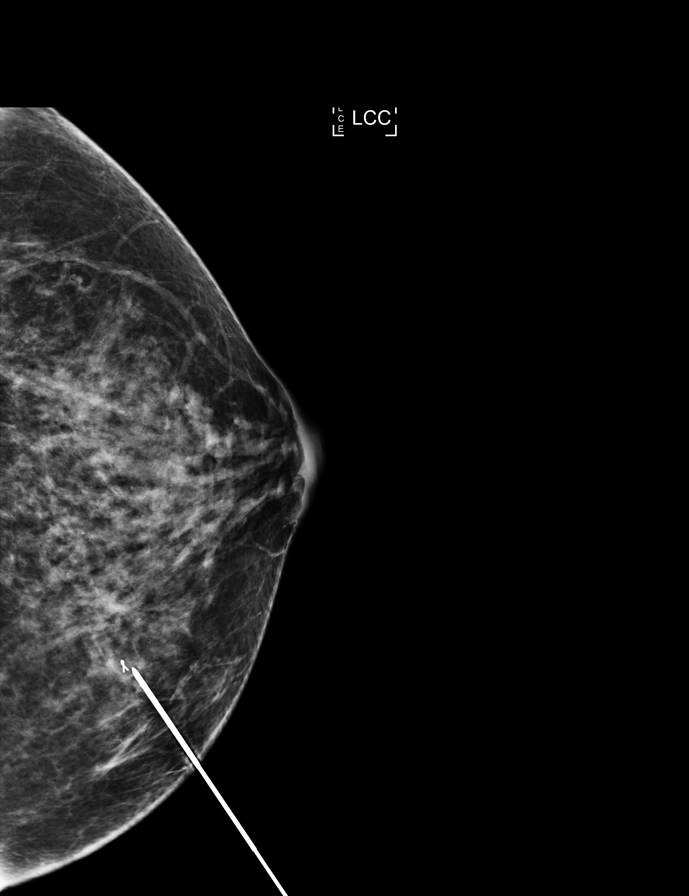

[L CC (4 of 5)]
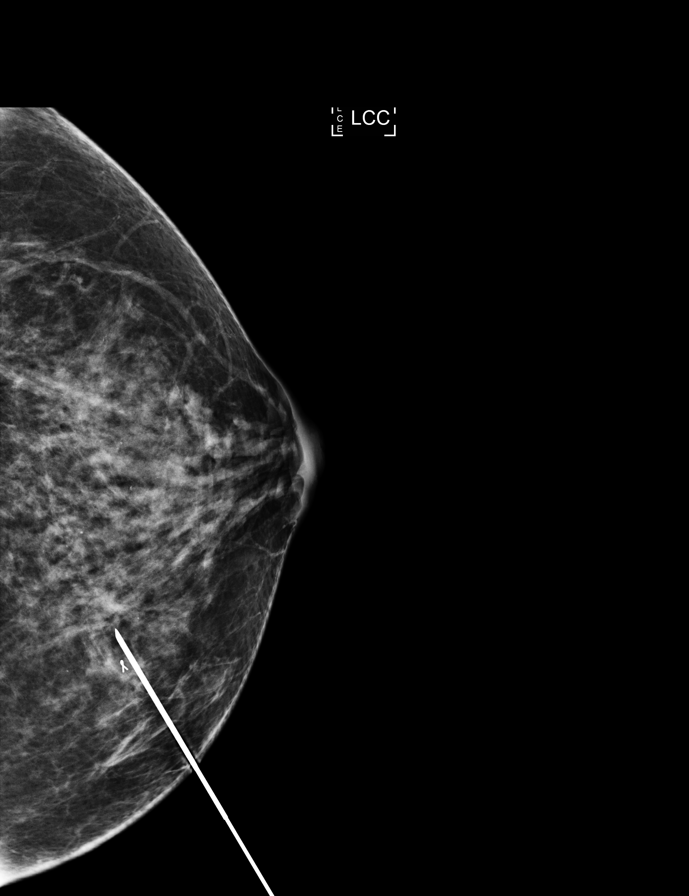

[L CC (5 of 5)]
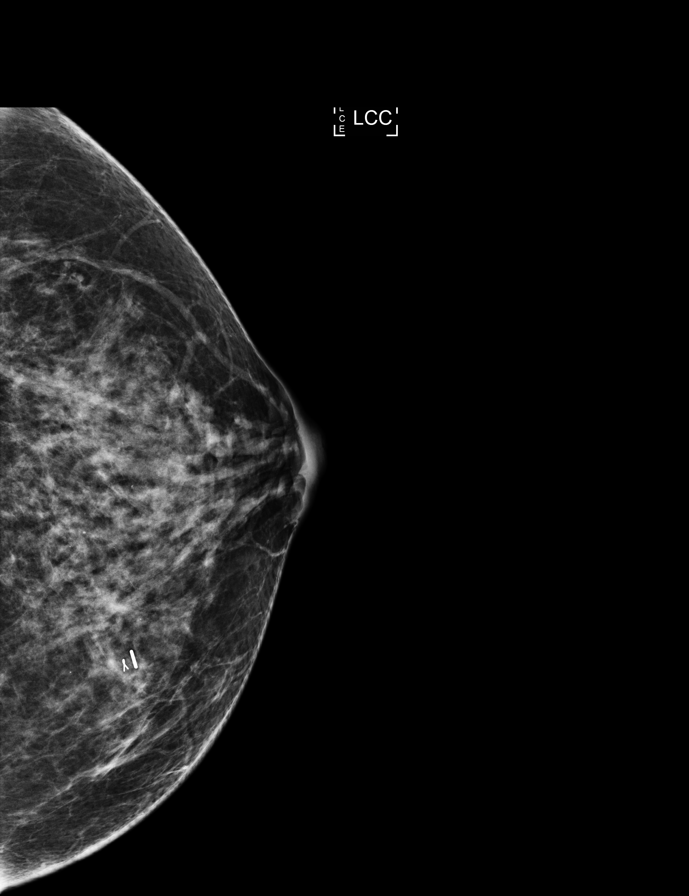

[L ML (1 of 2)]
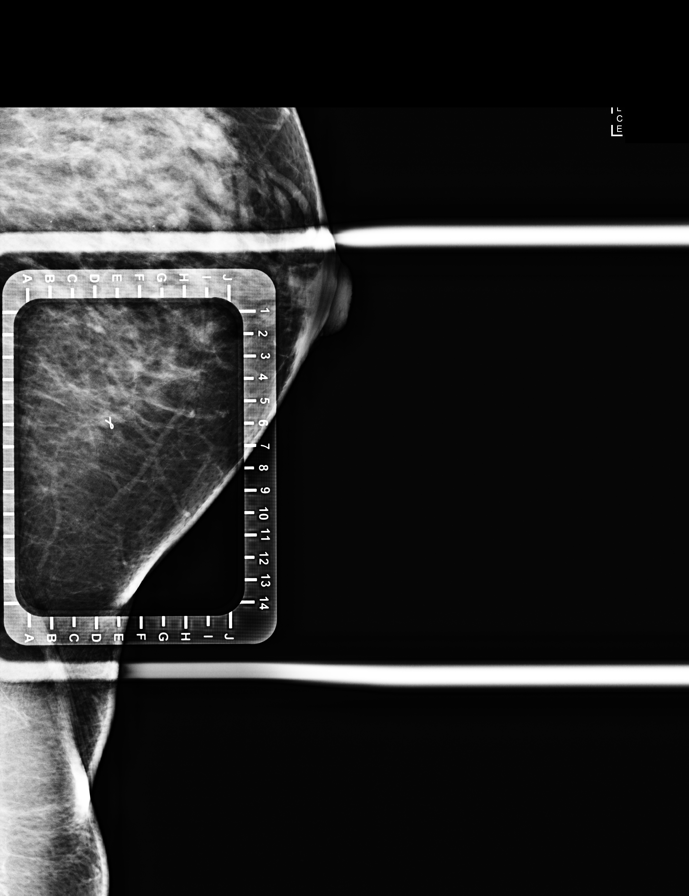

[L ML (2 of 2)]
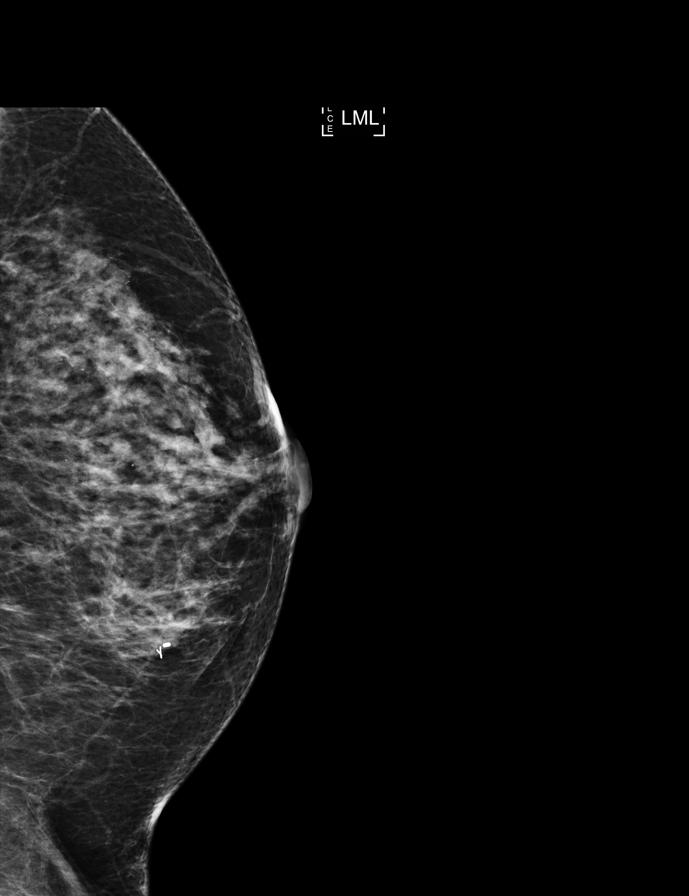

[7 of 7 positions shown; findings below may reference images not displayed]

FINDINGS: Patient presents for radioactive seed localization prior to
lumpectomy. I met with the patient and we discussed the procedure of
seed localization including benefits and alternatives. We discussed
the high likelihood of a successful procedure. We discussed the
risks of the procedure including infection, bleeding, tissue injury
and further surgery. We discussed the low dose of radioactivity
involved in the procedure. Informed, written consent was given.

The usual time-out protocol was performed immediately prior to the
procedure.

Using mammographic guidance, sterile technique, 1% lidocaine and an
J-70I radioactive seed, ribbon shaped biopsy clip in the lower inner
quadrant of the left breast was localized using a medial approach.
The follow-up mammogram images confirm the seed in the expected
location and were marked for Dr. Qwerkhu.

Follow-up survey of the patient confirms presence of the radioactive
seed.

Order number of J-70I seed:  [id number].

Total activity: 0.253 mCi reference Date: 14 January, 2020

The patient tolerated the procedure well and was released from the
[REDACTED]. She was given instructions regarding seed removal.
IMPRESSION: Radioactive seed localization right breast. No apparent
complications.

## 2021-07-03 IMAGING — MG MM BREAST SURGICAL SPECIMEN
1 series · 1 of 1 positions shown · non-contrast
Comparison: Previous exam(s).

CLINICAL DATA: Status post radioactive seed localization of the
LEFT breast.

EXAM:
SPECIMEN RADIOGRAPH OF THE LEFT BREAST

[L]
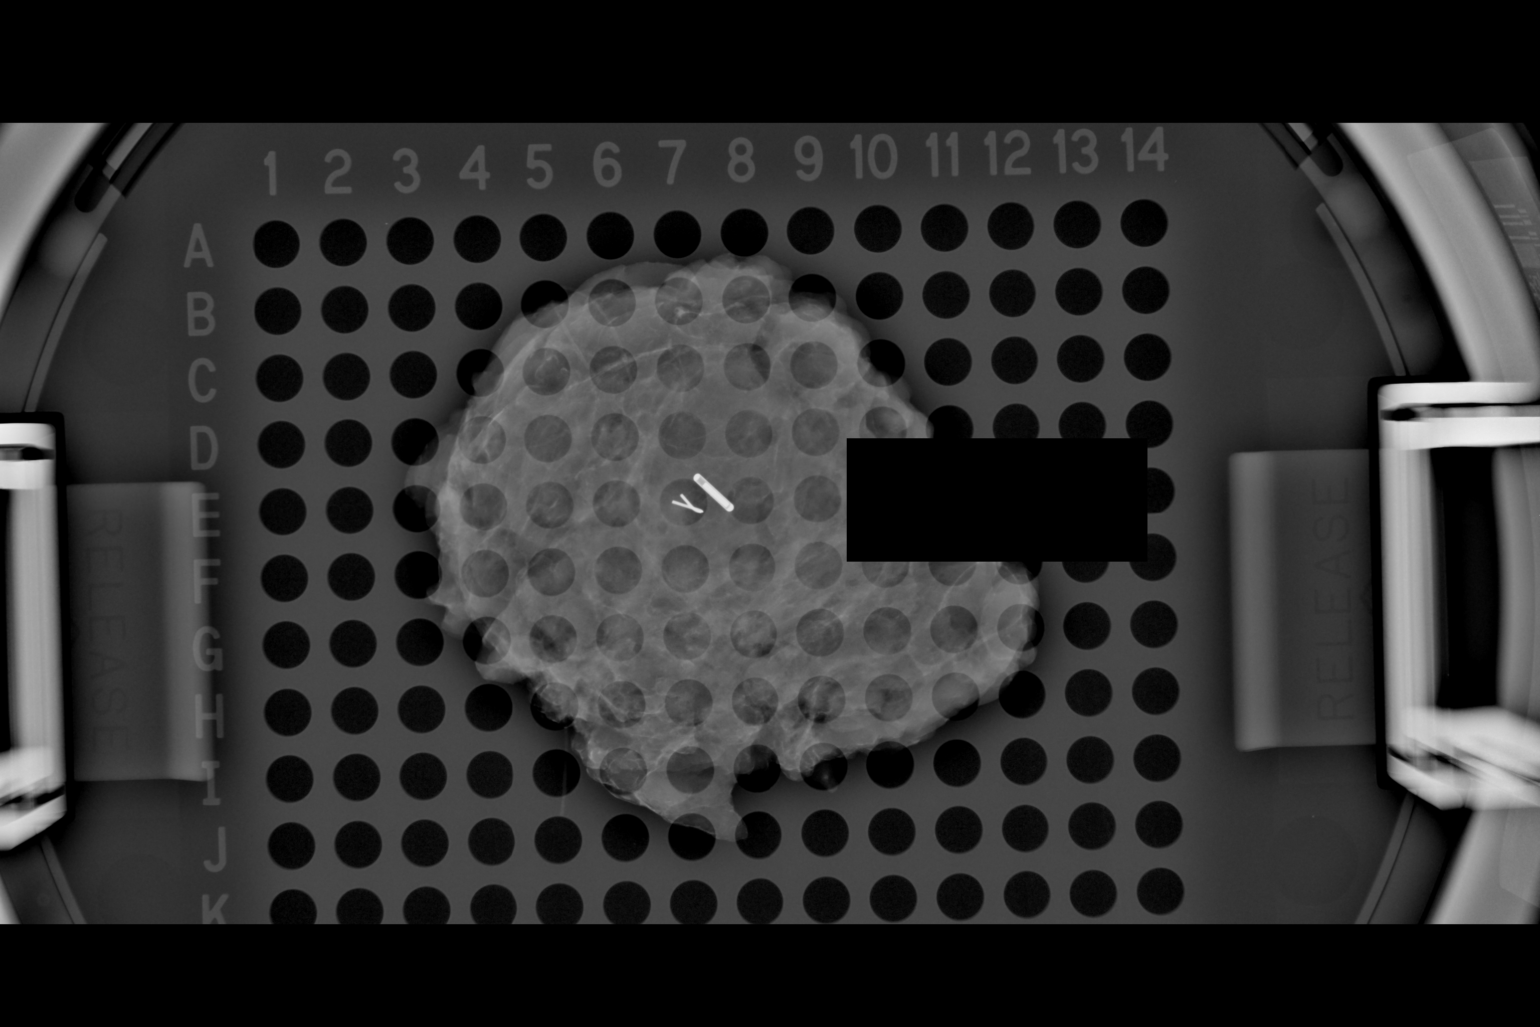

[1 of 1 positions shown; findings below may reference images not displayed]

FINDINGS: Status post excision of the left breast. The radioactive seed and
ribbon shaped biopsy marker clip are present, completely intact, and
were marked for pathology.
IMPRESSION: Specimen radiograph of the LEFT breast.

## 2021-07-03 IMAGING — MG MM BREAST SURGICAL SPECIMEN
1 series · 1 of 1 positions shown · non-contrast
Comparison: Previous exam(s).

CLINICAL DATA: Status post radioactive seed localization of the
RIGHT breast.

EXAM:
SPECIMEN RADIOGRAPH OF THE RIGHT BREAST

[R]
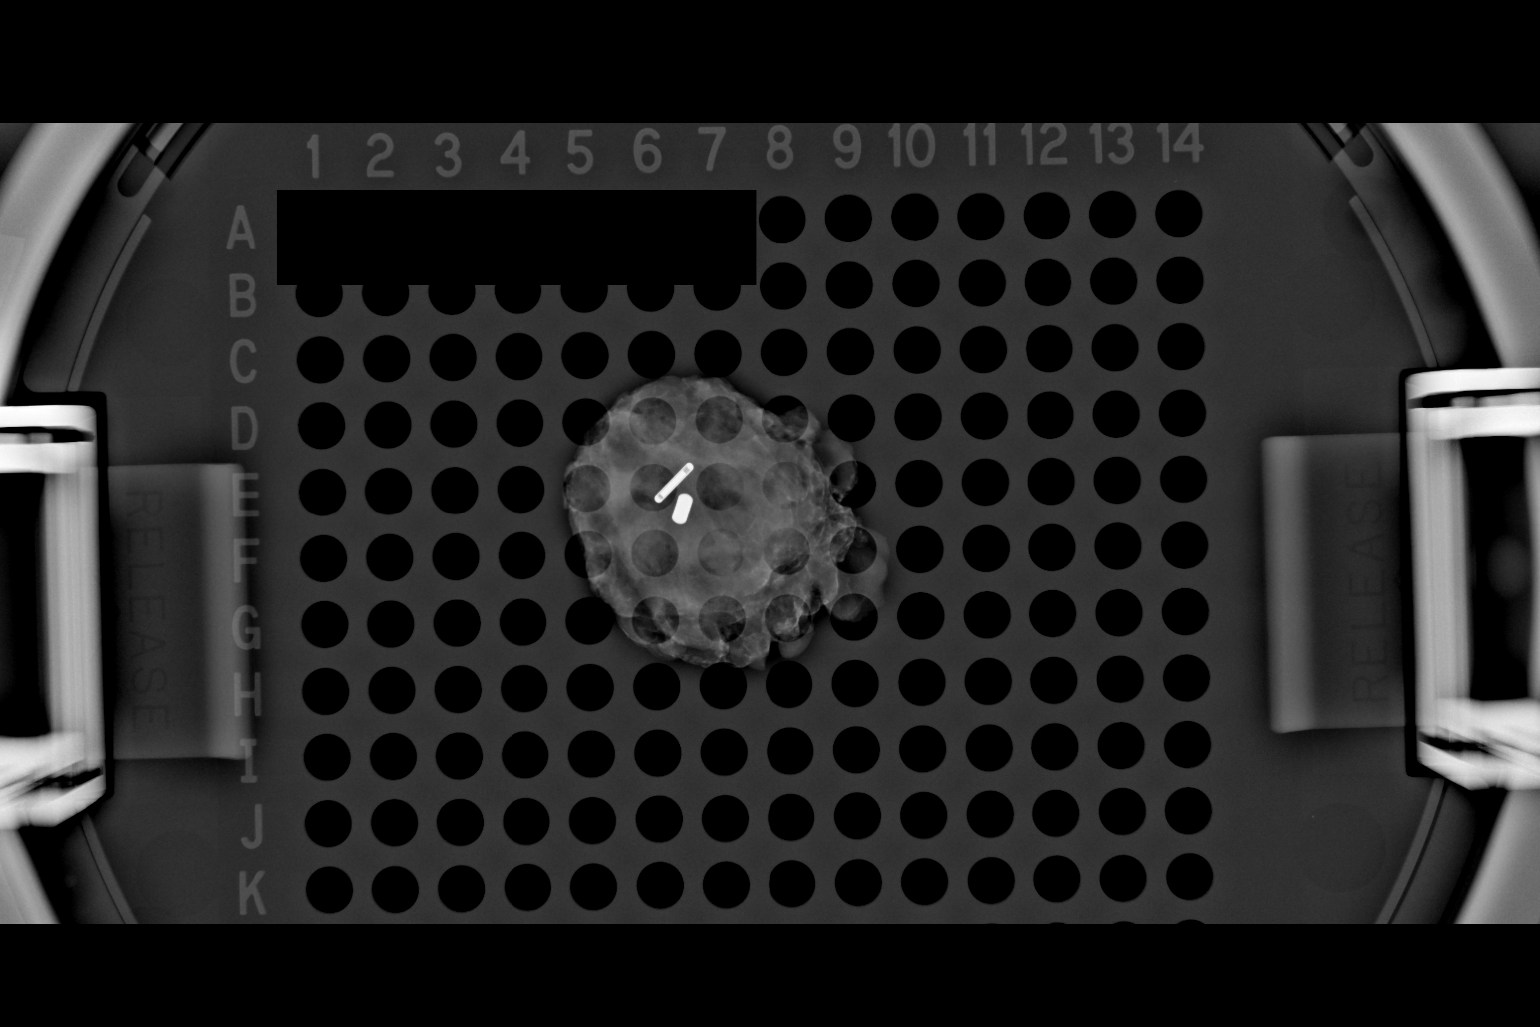

[1 of 1 positions shown; findings below may reference images not displayed]

FINDINGS: Status post excision of the right breast. The radioactive seed and
cylinder-shaped biopsy marker clip are present, completely intact,
and were marked for pathology.
IMPRESSION: Specimen radiograph of the right breast.

## 2021-07-18 NOTE — Assessment & Plan Note (Signed)
01/29/20:Bilateral lumpectomies [name]): Left breast: low grade DCIS involving papilloma, clear marginsER/PR 100%; Right breast: no evidence of carcinoma  Treatment plan: 1.Adjuvant radiation therapy8/31/2021-04/05/2020 2.followed by adjuvant antiestrogen therapy with tamoxifen x5 years  Tamoxifen toxicities: Profound vaginal discharge of brownish material Heavy menstrual bleed lasting for more than a month  Return to clinic in 6 months for follow-up to assess restarting tamoxifen at a lower dose.

## 2021-07-19 ENCOUNTER — Inpatient Hospital Stay: Payer: BC Managed Care – PPO | Attending: Hematology and Oncology | Admitting: Hematology and Oncology

## 2021-07-19 DIAGNOSIS — D0512 Intraductal carcinoma in situ of left breast: Secondary | ICD-10-CM | POA: Diagnosis not present

## 2021-07-19 MED ORDER — TAMOXIFEN CITRATE 10 MG PO TABS: 5.0000 mg | ORAL_TABLET | Freq: Two times a day (BID) | ORAL | 3 refills | Status: DC

## 2021-07-19 NOTE — Progress Notes (Signed)
HEMATOLOGY-ONCOLOGY TELEPHONE VISIT PROGRESS NOTE  I connected with Pamela Martinez on 07/19/21 at 11:15 AM EST by telephone and verified that I am speaking with the correct person using two identifiers.  I discussed the limitations, risks, security and privacy concerns of performing an evaluation and management service by telephone and the availability of in person appointments.  I also discussed with the patient that there may be a patient responsible charge related to this service. The patient expressed understanding and agreed to proceed.   History of Present Illness: Follow-up on 5 mg of tamoxifen She is tolerating the 5 mg dose extremely well without any problems or concerns.  She no longer has a vaginal discharge or heavy menstrual bleeding.  Oncology History  Ductal carcinoma in situ (DCIS) of left breast  11/04/2019 Initial Diagnosis   Screening mammogram on 10/12/19 detected a possible left breast mass. Diagnostic mammogram and [place] on 10/27/19 showed a 0.6cm mass at the 8:30 position in the left breast. Biopsy on 11/04/19 showed ductal carcinoma in situ with calcifications partially involving an intraductal papilloma, low grade, ER/PR+ 100%.    01/29/2020 Surgery   Bilateral lumpectomies [name]) [phone]): left breast: low grade DCIS involving papilloma, clear margins; right breast: no evidence of carcinoma. No regional lymph nodes were examined.   02/01/2020 Cancer Staging   Staging form: Breast, AJCC 8th Edition - Pathologic stage from 02/01/2020: Stage 0 (pTis (DCIS), pN0, cM0, ER+, PR+)   03/07/2020 - 04/01/2020 Radiation Therapy   The patient initially received a dose of 42.56 Gy in 16 fractions to the breast using whole-breast tangent fields. This was delivered using a 3-D conformal technique. The total dose was 42.56 Gy.   06/2020 - 06/2025 Anti-estrogen oral therapy   Tamoxifen     REVIEW OF SYSTEMS:   Constitutional: Denies fevers, chills or abnormal weight loss    Observations/Objective:     Assessment Plan:  Ductal carcinoma in situ (DCIS) of left breast 01/29/20: Bilateral lumpectomies [name]):  Left breast: low grade DCIS involving papilloma, clear margins ER/PR 100%;  Right breast: no evidence of carcinoma   Treatment plan: 1.  Adjuvant radiation therapy 03/08/2020-04/05/2020 2. followed by adjuvant antiestrogen therapy with tamoxifen x5 years Now on 5 mg daily   Tamoxifen toxicities: No further problems with vaginal discharge or heavy menstrual bleeding on the 5 mg dosage. I sent a new refill prescription to [facility].  I will see her in person in 1 year for breast exam and follow-up.  I discussed the assessment and treatment plan with the patient. The patient was provided an opportunity to ask questions and all were answered. The patient agreed with the plan and demonstrated an understanding of the instructions. The patient was advised to call back or seek an in-person evaluation if the symptoms worsen or if the condition fails to improve as anticipated.   I provided 11 minutes of non-face-to-face time during this encounter. Harriette Ohara, MD

## 2021-08-15 ENCOUNTER — Encounter (INDEPENDENT_AMBULATORY_CARE_PROVIDER_SITE_OTHER): Payer: Self-pay | Admitting: *Deleted

## 2021-08-17 ENCOUNTER — Telehealth: Payer: Self-pay | Admitting: Hematology and Oncology

## 2021-08-17 NOTE — Telephone Encounter (Signed)
Scheduled appointment per 1/11 los. Left message. Patient will be mailed an updated calendar.

## 2021-09-01 ENCOUNTER — Encounter: Payer: Self-pay | Admitting: Nurse Practitioner

## 2021-09-04 ENCOUNTER — Ambulatory Visit: Payer: BC Managed Care – PPO | Admitting: Nurse Practitioner

## 2021-09-04 ENCOUNTER — Encounter: Payer: Self-pay | Admitting: Nurse Practitioner

## 2021-09-04 ENCOUNTER — Telehealth: Payer: Self-pay | Admitting: *Deleted

## 2021-09-04 ENCOUNTER — Other Ambulatory Visit: Payer: Self-pay

## 2021-09-04 VITALS — BP 124/84

## 2021-09-04 DIAGNOSIS — R590 Localized enlarged lymph nodes: Secondary | ICD-10-CM | POA: Diagnosis not present

## 2021-09-04 DIAGNOSIS — N644 Mastodynia: Secondary | ICD-10-CM

## 2021-09-04 DIAGNOSIS — Z853 Personal history of malignant neoplasm of breast: Secondary | ICD-10-CM | POA: Diagnosis not present

## 2021-09-04 NOTE — Telephone Encounter (Signed)
Patient scheduled at breast center on 09/27/21 @ 1:45pm

## 2021-09-04 NOTE — Telephone Encounter (Signed)
-----   Message from Tamela Gammon, NP sent at 09/04/2021 10:12 AM EST ----- Regarding: Diagnostic mammogram Please send referral for diagnostic mammogram of left breast due to pain, history of left breast cancer, and axillary adenopathy. Thanks.

## 2021-09-04 NOTE — Progress Notes (Signed)
° °  Acute Office Visit  Subjective:    Patient ID: Pamela Martinez, female    DOB: April 12, 1971, 51 y.o.   MRN: [id number]   HPI 51 y.o. presents today for left breast pain x 1 week. She had a couple of swollen left axillary lymph nodes just prior to when she noticed pain. The lymph nodes are smaller now and the pain is also somewhat improved in her breast. Pain is mostly tender/sore to touch and in upper and mid outer region. Denies mass, nipple discharge, or skin changes.  Denies recent sickness that could have caused enlarged lymph nodes. 01/2020 left DCIS ER/PR+ managed with lumpectomy, radiation, and currently on Tamoxifen. Mammogram due in April. Normal mammogram 10/2020.    Review of Systems  Constitutional: Negative.   Hematological:  Positive for adenopathy (left axillary).  Left breast: Positive for pain. Negative for mass, nipple discharge, nipple inversion, swelling, redness, or skin changes Right breast: Negative    Objective:    Physical Exam Constitutional:      Appearance: Normal appearance.  Chest:  Breasts:    Right: Normal.     Left: Tenderness (To upper outer region and along axillary tail) present. No swelling, bleeding, inverted nipple, mass, nipple discharge or skin change.    Lymphadenopathy:     Upper Body:     Left upper body: Axillary adenopathy (1 palpable lymph node in mid axillary) present.    BP 124/84  Wt Readings from Last 3 Encounters:  06/19/21 186 lb 3.2 oz (84.5 kg)  04/09/21 186 lb 1.1 oz (84.4 kg)  12/09/20 186 lb 8 oz (84.6 kg)        Patient informed chaperone available to be present for breast and pelvic exam. Patient has requested no chaperone to be present. Patient has been advised what will be completed during breast and pelvic exam.   Assessment & Plan:   Problem List Items Addressed This Visit   None Visit Diagnoses     Pain of left breast    -  Primary   Axillary adenopathy       History of left breast cancer           Plan: Will send referral for diagnostic mammogram. She is agreeable to plan.     [place], 10:13 AM 09/04/2021

## 2021-09-06 ENCOUNTER — Encounter: Payer: Self-pay | Admitting: Nurse Practitioner

## 2021-09-07 ENCOUNTER — Telehealth (INDEPENDENT_AMBULATORY_CARE_PROVIDER_SITE_OTHER): Payer: Self-pay | Admitting: *Deleted

## 2021-09-07 ENCOUNTER — Other Ambulatory Visit (INDEPENDENT_AMBULATORY_CARE_PROVIDER_SITE_OTHER): Payer: Self-pay

## 2021-09-07 ENCOUNTER — Encounter (INDEPENDENT_AMBULATORY_CARE_PROVIDER_SITE_OTHER): Payer: Self-pay | Admitting: *Deleted

## 2021-09-07 DIAGNOSIS — Z1211 Encounter for screening for malignant neoplasm of colon: Secondary | ICD-10-CM

## 2021-09-07 DIAGNOSIS — Z800 Family history of malignant neoplasm of digestive organs: Secondary | ICD-10-CM

## 2021-09-07 MED ORDER — PEG 3350-KCL-NA BICARB-NACL 420 G PO SOLR: 4000.0000 mL | Freq: Once | ORAL | 0 refills | Status: AC

## 2021-09-07 NOTE — Telephone Encounter (Signed)
Patient needs trilyte 

## 2021-09-07 NOTE — Telephone Encounter (Signed)
Referring MD/PCP: hall ? ?Procedure: tcs ? ?Reason/Indication:  screening, fam hx colon ca ? ?Has patient had this procedure before?  no ? If so, when, by whom and where?   ? ?Is there a family history of colon cancer?  Yes, grandfather ? Who?  What age when diagnosed?   ? ?Is patient diabetic? If yes, Type 1 or Type 2   no ?     ?Does patient have prosthetic heart valve or mechanical valve?  no ? ?Do you have a pacemaker/defibrillator?  no ? ?Has patient ever had endocarditis/atrial fibrillation? no ? ?Does patient use oxygen? no ? ?Has patient had joint replacement within last 12 months?  no ? ?Is patient constipated or do they take laxatives? no ? ?Does patient have a history of alcohol/drug use?  no ? ?Have you had a stroke/heart attack last 6 mths? no ? ?Do you take medicine for weight loss?  no ? ?For female patients,: have you had a hysterectomy  ?                     are you post menopausal  ?                     do you still have your menstrual cycle no ? ?Is patient on blood thinner such as Coumadin, Plavix and/or Aspirin? no ? ?Medications: tamoxifen 5 mg daily, zoloft 50 mg daily, allegra 10 mg daily ? ?Allergies: sulfa antibiotics, diflucan ? ?Medication Adjustment per Dr Rehman/Dr Jenetta Downer  ? ?Procedure date & time: 10/05/21 ?Marland Kitchen ?

## 2021-09-18 ENCOUNTER — Ambulatory Visit
Admission: RE | Admit: 2021-09-18 | Discharge: 2021-09-18 | Disposition: A | Discharge status: Home / Self Care | Payer: BC Managed Care – PPO | Source: Ambulatory Visit | Attending: Nurse Practitioner | Admitting: Nurse Practitioner

## 2021-09-18 ENCOUNTER — Other Ambulatory Visit: Payer: Self-pay | Admitting: Nurse Practitioner

## 2021-09-18 DIAGNOSIS — N644 Mastodynia: Secondary | ICD-10-CM

## 2021-09-18 DIAGNOSIS — Z853 Personal history of malignant neoplasm of breast: Secondary | ICD-10-CM

## 2021-09-18 DIAGNOSIS — R590 Localized enlarged lymph nodes: Secondary | ICD-10-CM

## 2021-09-25 ENCOUNTER — Encounter: Payer: Self-pay | Admitting: Nurse Practitioner

## 2021-09-25 ENCOUNTER — Ambulatory Visit (INDEPENDENT_AMBULATORY_CARE_PROVIDER_SITE_OTHER): Payer: BC Managed Care – PPO | Admitting: Nurse Practitioner

## 2021-09-25 ENCOUNTER — Other Ambulatory Visit: Payer: Self-pay

## 2021-09-25 VITALS — BP 124/78 | Ht 64.0 in | Wt 191.0 lb

## 2021-09-25 DIAGNOSIS — Z79810 Long term (current) use of selective estrogen receptor modulators (SERMs): Secondary | ICD-10-CM | POA: Diagnosis not present

## 2021-09-25 DIAGNOSIS — D0512 Intraductal carcinoma in situ of left breast: Secondary | ICD-10-CM

## 2021-09-25 DIAGNOSIS — Z01419 Encounter for gynecological examination (general) (routine) without abnormal findings: Secondary | ICD-10-CM | POA: Diagnosis not present

## 2021-09-25 DIAGNOSIS — N939 Abnormal uterine and vaginal bleeding, unspecified: Secondary | ICD-10-CM

## 2021-09-25 DIAGNOSIS — Z30431 Encounter for routine checking of intrauterine contraceptive device: Secondary | ICD-10-CM | POA: Diagnosis not present

## 2021-09-25 NOTE — Progress Notes (Signed)
? ?Pamela Martinez 1971-06-27 [id number] ? ? ?History:  51 y.o. G2P0002 presents for annual exam. Mirena IUD 02/2016. She has continuous spotting since restarting Tamoxifen in December. She had bleeding with it when she was on it prior but bleeding stopped when she discontinued last summer. Bleeding is light. 2015 ASCUS positive HPV with negative C & B, 2019 ASCUS negative HPV. 11/2019 left DCIS managed with lumpectomy, radiation, and Tamoxifen. Was seen in February for breast pain with normal breast imaging 09/18/2021.  ? ?Gynecologic History ?Patient's last menstrual period was 08/27/2021. ?Period Pattern: (!) Irregular ?Menstrual Flow: Moderate (Varies) ?Dysmenorrhea: None ?Contraception/Family planning: IUD ?Sexually active: Yes ? ?Health Maintenance ?Last Pap: 09/22/2020. Results were: Normal, 3-year repeat ?Last mammogram: 09/18/2021. Results were: Stable lumpectomy changes, no evidence of malignancy ?Last colonoscopy: Never. Scheduled 10/05/2021 ?Last Dexa: Never ? ?Past medical history, past surgical history, family history and social history were all reviewed and documented in the EPIC chart. Married. 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 71 and 79 yo children.  ? ?ROS:  A ROS was performed and pertinent positives and negatives are included. ? ?Exam: ? ?Vitals:  ? 09/25/21 0827  ?BP: 124/78  ?Weight: 191 lb (86.6 kg)  ?Height: '5\' 4"'$  (1.626 m)  ? ? ?Body mass index is 32.79 kg/m?. ? ?General appearance:  Normal ?Thyroid:  Symmetrical, normal in size, without palpable masses or nodularity. ?Respiratory ? Auscultation:  Clear without wheezing or rhonchi ?Cardiovascular ? Auscultation:  Regular rate, without rubs, murmurs or gallops ? Edema/varicosities:  Not grossly evident ?Abdominal ? Soft,nontender, without masses, guarding or rebound. ? Liver/spleen:  No organomegaly noted ? Hernia:  None appreciated ? Skin ? Inspection:  Grossly normal ?  ?Breasts: Examined lying and  sitting.  ? Right: Without masses, retractions, discharge or axillary adenopathy. ? ? Left: Without masses, retractions, discharge or axillary adenopathy. Well-healed lumpectomy scar.  ?Genitourinary  ? Inguinal/mons:  Normal without inguinal adenopathy ? External genitalia:  Normal appearing vulva with no masses, tenderness, or lesions ? BUS/Urethra/Skene's glands:  Normal ? Vagina:  Light brown discharge ? Cervix:  Normal appearing without discharge or lesions. IUD string visible ? Uterus:  Normal in size, shape and contour.  Midline and mobile, nontender ? Adnexa/parametria:   ?  Rt: Normal in size, without masses or tenderness. ?  Lt: Normal in size, without masses or tenderness. ? Anus and perineum: Normal ? Digital rectal exam: Deferred. Colonoscopy scheduled ? ?Patient informed chaperone available to be present for breast and pelvic exam. Patient has requested no chaperone to be present. Patient has been advised what will be completed during breast and pelvic exam.  ? ?Assessment/Plan:  51 y.o. G2P0002 for annual exam.  ? ?Well female exam with routine gynecological exam - Education provided on SBEs, importance of preventative screenings, current guidelines, high calcium diet, regular exercise, and multivitamin daily. Labs with PCP in January. ? ?Encounter for routine checking of intrauterine contraceptive device (IUD) - Mirena IUD 02/2016. IUD string visible. Continuous spotting since restarting Tamoxifen in December. She is aware of 8-year FDA approval.  ? ?Ductal carcinoma in situ (DCIS) of left breast - 11/2019 managed with lumpectomy, radiation and Tamoxifen. Stopped Tamoxifen last summer and restarted a couple of months ago.  ? ?Use of tamoxifen (Nolvadex) - Plan: US PELVIS TRANSVAGINAL NON-OB (TV ONLY). She has continuous spotting since restarting Tamoxifen in December. She had bleeding with it when she was on it prior but bleeding stopped when she discontinued last  summer. Bleeding is light.  ? ?Vaginal  spotting - Plan: US PELVIS TRANSVAGINAL NON-OB (TV ONLY). With Tamoxifen use.  ? ?Screening for cervical cancer - 2015 ASCUS positive HPV with negative C & B, 2019 ASCUS negative HPV, normal 09/2020. Will repeat at 3-year interval per guidelines.  ? ?Screening for colon cancer - Initial colonoscopy scheduled 10/05/2021.  ? ?Screening for osteoporosis - Both grandmothers with history of osteoporosis. Recommend Vitamin D + calcium and regular exercise. Recommend DXA but she wants to wait.  ? ?Return in 1 year for annual.  ? ? ? ? ?Tamela Gammon DNP, 8:49 AM 09/25/2021 ? ?

## 2021-09-27 ENCOUNTER — Other Ambulatory Visit: Payer: BC Managed Care – PPO

## 2021-10-04 ENCOUNTER — Other Ambulatory Visit (INDEPENDENT_AMBULATORY_CARE_PROVIDER_SITE_OTHER): Payer: Self-pay

## 2021-10-04 DIAGNOSIS — Z01812 Encounter for preprocedural laboratory examination: Secondary | ICD-10-CM

## 2021-10-05 ENCOUNTER — Ambulatory Visit (HOSPITAL_COMMUNITY)
Admission: RE | Admit: 2021-10-05 | Discharge: 2021-10-05 | Disposition: A | Discharge status: Home / Self Care | Payer: BC Managed Care – PPO | Attending: Internal Medicine | Admitting: Internal Medicine

## 2021-10-05 ENCOUNTER — Other Ambulatory Visit (HOSPITAL_COMMUNITY)
Admission: RE | Admit: 2021-10-05 | Discharge: 2021-10-05 | Disposition: A | Discharge status: Home / Self Care | Payer: BC Managed Care – PPO | Source: Ambulatory Visit | Attending: Internal Medicine | Admitting: Internal Medicine

## 2021-10-05 ENCOUNTER — Encounter (HOSPITAL_COMMUNITY)
Admission: RE | Disposition: A | Discharge status: Home / Self Care | Payer: Self-pay | Source: Home / Self Care | Attending: Internal Medicine

## 2021-10-05 ENCOUNTER — Other Ambulatory Visit: Payer: Self-pay

## 2021-10-05 ENCOUNTER — Encounter (HOSPITAL_COMMUNITY): Payer: Self-pay | Admitting: Internal Medicine

## 2021-10-05 ENCOUNTER — Ambulatory Visit (HOSPITAL_COMMUNITY): Payer: BC Managed Care – PPO | Admitting: Anesthesiology

## 2021-10-05 DIAGNOSIS — K644 Residual hemorrhoidal skin tags: Secondary | ICD-10-CM | POA: Insufficient documentation

## 2021-10-05 DIAGNOSIS — Z87891 Personal history of nicotine dependence: Secondary | ICD-10-CM | POA: Diagnosis not present

## 2021-10-05 DIAGNOSIS — Z01812 Encounter for preprocedural laboratory examination: Secondary | ICD-10-CM | POA: Insufficient documentation

## 2021-10-05 DIAGNOSIS — R519 Headache, unspecified: Secondary | ICD-10-CM | POA: Diagnosis not present

## 2021-10-05 DIAGNOSIS — Z1211 Encounter for screening for malignant neoplasm of colon: Secondary | ICD-10-CM | POA: Insufficient documentation

## 2021-10-05 DIAGNOSIS — F419 Anxiety disorder, unspecified: Secondary | ICD-10-CM | POA: Insufficient documentation

## 2021-10-05 DIAGNOSIS — Z800 Family history of malignant neoplasm of digestive organs: Secondary | ICD-10-CM | POA: Diagnosis not present

## 2021-10-05 HISTORY — PX: COLONOSCOPY WITH PROPOFOL: SHX5780

## 2021-10-05 LAB — HM COLONOSCOPY

## 2021-10-05 LAB — PREGNANCY, URINE: Preg Test, Ur: NEGATIVE

## 2021-10-05 SURGERY — COLONOSCOPY WITH PROPOFOL
Anesthesia: General

## 2021-10-05 MED ORDER — LACTATED RINGERS IV SOLN: INTRAVENOUS | Status: DC

## 2021-10-05 MED ORDER — PROPOFOL 10 MG/ML IV BOLUS
INTRAVENOUS | Status: DC | PRN
Start: 2021-10-05 — End: 2021-10-05
  Administered 2021-10-05: 100 mg via INTRAVENOUS

## 2021-10-05 MED ORDER — LIDOCAINE 2% (20 MG/ML) 5 ML SYRINGE
INTRAMUSCULAR | Status: DC | PRN
Start: 2021-10-05 — End: 2021-10-05
  Administered 2021-10-05: 50 mg via INTRAVENOUS

## 2021-10-05 MED ORDER — PROPOFOL 500 MG/50ML IV EMUL
INTRAVENOUS | Status: DC | PRN
  Administered 2021-10-05: 200 ug/kg/min via INTRAVENOUS

## 2021-10-05 MED ORDER — LACTATED RINGERS IV SOLN: INTRAVENOUS | Status: DC | PRN

## 2021-10-05 NOTE — Progress Notes (Signed)
Pamela Martinez had a procedure today. 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 May return to work on 10/07/21 with out restrictions. ? ?  ?

## 2021-10-05 NOTE — Op Note (Signed)
[facility] ?Patient Name: [facility] ?Procedure Date: 10/05/2021 1:15 PM ?MRN: [id number] ?Date of Birth: Jun 15, 1971 ?Attending MD: Hildred Laser , MD ?CSN: [id number] ?Age: 51 ?Admit Type: Outpatient ?Procedure:                Colonoscopy ?Indications:              Screening for colorectal malignant neoplasm ?Providers:                Hildred Laser, MD, Lambert Mody, Crisann  ?                          Wynonia Lawman, Technician ?Referring MD:             Delphina Cahill, MD ?Medicines:                Propofol per Anesthesia ?Complications:            No immediate complications. ?Estimated Blood Loss:     Estimated blood loss: none. ?Procedure:                Pre-Anesthesia Assessment: ?                          - Prior to the procedure, a History and Physical  ?                          was performed, and patient medications and  ?                          allergies were reviewed. The patient's tolerance of  ?                          previous anesthesia was also reviewed. The risks  ?                          and benefits of the procedure and the sedation  ?                          options and risks were discussed with the patient.  ?                          All questions were answered, and informed consent  ?                          was obtained. Prior Anticoagulants: The patient has  ?                          taken no previous anticoagulant or antiplatelet  ?                          agents except for NSAID medication. ASA Grade  ?                          Assessment: II - A patient with mild systemic  ?  disease. After reviewing the risks and benefits,  ?                          the patient was deemed in satisfactory condition to  ?                          undergo the procedure. ?                          After obtaining informed consent, the colonoscope  ?                          was passed under direct vision. Throughout the  ?                          procedure, the patient's  blood pressure, pulse, and  ?                          oxygen saturations were monitored continuously. The  ?                          PCF-HQ190L ([id number]) scope was introduced through  ?                          the anus and advanced to the the cecum, identified  ?                          by appendiceal orifice and ileocecal valve. The  ?                          colonoscopy was performed without difficulty. The  ?                          patient tolerated the procedure well. The quality  ?                          of the bowel preparation was good. The ileocecal  ?                          valve, appendiceal orifice, and rectum were  ?                          photographed. ?Scope In: 1:21:19 PM ?Scope Out: 1:37:30 PM ?Scope Withdrawal Time: 0 hours 7 minutes 22 seconds  ?Total Procedure Duration: 0 hours 16 minutes 11 seconds  ?Findings: ?     The perianal and digital rectal examinations were normal. ?     The colon (entire examined portion) appeared normal. ?     External hemorrhoids were found during retroflexion. The hemorrhoids  ?     were small. ?Impression:               - The entire examined colon is normal. ?                          - External hemorrhoids. ?                          -  No specimens collected. ?Moderate Sedation: ?     Per Anesthesia Care ?Recommendation:           - Patient has a contact number available for  ?                          emergencies. The signs and symptoms of potential  ?                          delayed complications were discussed with the  ?                          patient. Return to normal activities tomorrow.  ?                          Written discharge instructions were provided to the  ?                          patient. ?                          - Resume previous diet today. ?                          - Continue present medications. ?                          - Repeat colonoscopy in 10 years for screening  ?                          purposes. ?Procedure  Code(s):        --- Professional --- ?                          [phone], Colonoscopy, flexible; diagnostic, including  ?                          collection of specimen(s) by brushing or washing,  ?                          when performed (separate procedure) ?Diagnosis Code(s):        --- Professional --- ?                          Z12.11, Encounter for screening for malignant  ?                          neoplasm of colon ?                          K64.4, Residual hemorrhoidal skin tags ?CPT copyright 2019 American Medical Association. All rights reserved. ?The codes documented in this report are preliminary and upon coder review may  ?be revised to meet current compliance requirements. ?Hildred Laser, MD ?Hildred Laser, MD ?10/05/2021 1:43:56 PM ?This report has been signed electronically. ?Number of Addenda: 0 ?

## 2021-10-05 NOTE — Anesthesia Postprocedure Evaluation (Signed)
Anesthesia Post Note ? ?Patient: Lanie Schelling Buechler ? ?Procedure(s) Performed: COLONOSCOPY WITH PROPOFOL ? ?Patient location during evaluation: Phase II ?Anesthesia Type: General ?Level of consciousness: awake ?Pain management: pain level controlled ?Vital Signs Assessment: post-procedure vital signs reviewed and stable ?Respiratory status: spontaneous breathing and respiratory function stable ?Cardiovascular status: blood pressure returned to baseline and stable ?Postop Assessment: no headache and no apparent nausea or vomiting ?Anesthetic complications: no ?Comments: Late entry ? ? ?No notable events documented. ? ? ?Last Vitals:  ?Vitals:  ? 10/05/21 1124 10/05/21 1341  ?BP: 126/74 104/62  ?Pulse:  66  ?Resp: 20 15  ?Temp: 36.5 ?C   ?SpO2: 99% 96%  ?  ?Last Pain:  ?Vitals:  ? 10/05/21 1341  ?TempSrc: Oral  ?PainSc: 0-No pain  ? ? ?  ?  ?  ?  ?  ?  ? ?Louann Sjogren ? ? ? ? ?

## 2021-10-05 NOTE — Anesthesia Preprocedure Evaluation (Signed)
Anesthesia Evaluation  Patient identified by MRN, date of birth, ID band Patient awake    Reviewed: Allergy & Precautions, H&P , NPO status , Patient's Chart, lab work & pertinent test results, reviewed documented beta blocker date and time   Airway Mallampati: II  TM Distance: >3 FB Neck ROM: full    Dental no notable dental hx.    Pulmonary neg pulmonary ROS, former smoker,    Pulmonary exam normal breath sounds clear to auscultation       Cardiovascular Exercise Tolerance: Good negative cardio ROS   Rhythm:regular Rate:Normal     Neuro/Psych  Headaches, PSYCHIATRIC DISORDERS Anxiety    GI/Hepatic negative GI ROS, Neg liver ROS,   Endo/Other  negative endocrine ROS  Renal/GU negative Renal ROS  negative genitourinary   Musculoskeletal   Abdominal   Peds  Hematology negative hematology ROS (+)   Anesthesia Other Findings   Reproductive/Obstetrics negative OB ROS                             Anesthesia Physical Anesthesia Plan  ASA: 2  Anesthesia Plan: General   Post-op Pain Management:    Induction:   PONV Risk Score and Plan: Propofol infusion  Airway Management Planned:   Additional Equipment:   Intra-op Plan:   Post-operative Plan:   Informed Consent: I have reviewed the patients History and Physical, chart, labs and discussed the procedure including the risks, benefits and alternatives for the proposed anesthesia with the patient or authorized representative who has indicated his/her understanding and acceptance.     Dental Advisory Given  Plan Discussed with: CRNA  Anesthesia Plan Comments:         Anesthesia Quick Evaluation  

## 2021-10-05 NOTE — Discharge Instructions (Signed)
Resume usual medications and diet as before ?No driving for 24 hours. ?Next screening exam in 10 years. ? ?

## 2021-10-05 NOTE — Transfer of Care (Signed)
Immediate Anesthesia Transfer of Care Note ? ?Patient: Caoimhe Damron Lehn ? ?Procedure(s) Performed: COLONOSCOPY WITH PROPOFOL ? ?Patient Location: Endoscopy Unit ? ?Anesthesia Type:MAC ? ?Level of Consciousness: sedated and patient cooperative ? ?Airway & Oxygen Therapy: Patient Spontanous Breathing ? ?Post-op Assessment: Report given to RN and Post -op Vital signs reviewed and stable ? ?Post vital signs: Reviewed and stable ? ?Last Vitals:  ?Vitals Value Taken Time  ?BP    ?Temp    ?Pulse    ?Resp    ?SpO2    ? ? ?Last Pain:  ?Vitals:  ? 10/05/21 1320  ?TempSrc:   ?PainSc: 4   ?   ? ?Patients Stated Pain Goal: 7 (10/05/21 1123) ? ?Complications: No notable events documented. ?

## 2021-10-05 NOTE — H&P (Signed)
Pamela Martinez is an 51 y.o. female.   ?Chief Complaint: Patient is here for colonoscopy. ?HPI: Patient is 51 year old Caucasian female who is here for screening colonoscopy.  She denies abdominal pain change in bowel habits or rectal bleeding.  Family history significant for colon cancer in a paternal grandfather who is probably in his 61s. ?Patient takes ibuprofen on as-needed basis.  She took this morning for headache. ? ?Past Medical History:  ?Diagnosis Date  ? Anxiety   ? 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 Colposcopy with ECC showed KA 2015  recommend followup Pap smear/HPV at her annual 2016   ? Asthma   ? no inhalers  ? Ductal carcinoma in situ (DCIS) of left breast   ? Joint pain   ? Migraine   ? Neurocardiogenic syncope   ? Personal history of radiation therapy   ? PONV (postoperative nausea and vomiting)   ? Restless leg syndrome   ? ? ?Past Surgical History:  ?Procedure Laterality Date  ? BREAST EXCISIONAL BIOPSY    ? BREAST LUMPECTOMY    ? BREAST LUMPECTOMY WITH RADIOACTIVE SEED LOCALIZATION Bilateral 01/29/2020  ? Procedure: BILATERAL BREAST LUMPECTOMY WITH RADIOACTIVE SEED LOCALIZATION;  Surgeon: Jovita Kussmaul, MD;  Location: [place];  Service: General;  Laterality: Bilateral;  ? INTRAUTERINE DEVICE INSERTION  02/10/2016  ? Mirena  ? KNEE SURGERY    ? X 4 = 1X RIGHT 3X LEFT/3 ARTHROSCOPIES AND 1 ACL  ? KNEE SURGERY    ? ACL  ? MM BREAST STEREO BIOPSY LEFT ([facility] HX) Left   ? WISDOM TOOTH EXTRACTION    ? ? ?Family History  ?Problem Relation Age of Onset  ? COPD Father   ? CAD Father   ? Hyperlipidemia Brother   ? Hypertension Brother   ? Hyperlipidemia Brother   ? Osteoporosis Maternal Grandmother   ? Stroke Maternal Grandmother   ? Osteoporosis Paternal Grandmother   ? Cancer Paternal Grandfather   ?     COLON  ? Cancer Paternal Aunt   ?     PRIMARY PERITONEAL CANCER  ? ?Social History:  reports that she quit smoking about 33 years ago. Her smoking use included cigarettes. She has  never used smokeless tobacco. She reports current alcohol use. She reports that she does not use drugs. ? ?Allergies:  ?Allergies  ?Allergen Reactions  ? Diflucan [Fluconazole] Dermatitis  ?  Causes welts  ? Mixed Ragweed Other (See Comments)  ?  Seasonal allergies ?  ? Sulfa Antibiotics Itching  ? ? ?Facility-Administered Medications Prior to Admission  ?Medication Dose Route Frequency Provider Last Rate Last Admin  ? levonorgestrel (MIRENA) 20 MCG/24HR IUD   Intrauterine Once Fontaine, Belinda Block, MD      ? ?Medications Prior to Admission  ?Medication Sig Dispense Refill  ? ALPRAZolam (XANAX) 0.5 MG tablet Take 0.5 mg by mouth at bedtime as needed for anxiety.    ? fluticasone (FLONASE) 50 MCG/ACT nasal spray Place 1 spray into both nostrils daily as needed for allergies or rhinitis.    ? ibuprofen (ADVIL) 200 MG tablet Take 200 mg by mouth every 6 (six) hours as needed.    ? tamoxifen (NOLVADEX) 10 MG tablet Take 0.5 tablets (5 mg total) by mouth 2 (two) times daily. (Patient taking differently: Take 5 mg by mouth daily.) 60 tablet 3  ? albuterol (VENTOLIN HFA) 108 (90 Base) MCG/ACT inhaler Inhale 1-2 puffs into the lungs every 6 (six) hours as needed  for wheezing or shortness of breath. 18 g 0  ? loratadine (CLARITIN) 10 MG tablet Take 10 mg by mouth daily.    ? magnesium oxide (MAG-OX) 400 MG tablet Take 400 mg by mouth daily.    ? sertraline (ZOLOFT) 50 MG tablet Take 50 mg by mouth daily.    ? ? ?Results for orders placed or performed during the hospital encounter of 10/05/21 (from the past 48 hour(s))  ?Pregnancy, urine     Status: None  ? Collection Time: 10/05/21  8:17 AM  ?Result Value Ref Range  ? Preg Test, Ur NEGATIVE NEGATIVE  ?  Comment:        ?THE SENSITIVITY OF THIS ?METHODOLOGY IS >20 mIU/mL. ?Performed at [facility], [address], [place], [place] [address] ?  ? ?No results found. ? ?Review of Systems ? ?Blood pressure 126/74, temperature 97.7 ?F (36.5 ?C), temperature source Oral, resp. rate  20, height '5\' 4"'$  (1.626 m), weight 83.5 kg, last menstrual period 10/02/2021, SpO2 99 %. ?Physical Exam ?HENT:  ?   Mouth/Throat:  ?   Mouth: Mucous membranes are moist.  ?   Pharynx: Oropharynx is clear.  ?Eyes:  ?   Conjunctiva/sclera: Conjunctivae normal.  ?Cardiovascular:  ?   Rate and Rhythm: Normal rate and regular rhythm.  ?   Heart sounds: Normal heart sounds. No murmur heard. ?Pulmonary:  ?   Effort: Pulmonary effort is normal.  ?   Breath sounds: Normal breath sounds.  ?Abdominal:  ?   General: There is no distension.  ?   Palpations: Abdomen is soft. There is no mass.  ?   Tenderness: There is no abdominal tenderness.  ?Musculoskeletal:     ?   General: No swelling.  ?   Cervical back: Neck supple.  ?Lymphadenopathy:  ?   Cervical: No cervical adenopathy.  ?Skin: ?   General: Skin is warm and dry.  ?Neurological:  ?   Mental Status: She is alert.  ?  ? ?Assessment/Plan ? ?Average risk screening colonoscopy. ? ?Hildred Laser, MD ?10/05/2021, 12:43 PM ? ? ? ?

## 2021-10-11 ENCOUNTER — Encounter (HOSPITAL_COMMUNITY): Payer: Self-pay | Admitting: Internal Medicine

## 2021-10-16 ENCOUNTER — Encounter (INDEPENDENT_AMBULATORY_CARE_PROVIDER_SITE_OTHER): Payer: Self-pay | Admitting: *Deleted

## 2021-10-19 ENCOUNTER — Ambulatory Visit (INDEPENDENT_AMBULATORY_CARE_PROVIDER_SITE_OTHER): Payer: BC Managed Care – PPO | Admitting: Obstetrics and Gynecology

## 2021-10-19 ENCOUNTER — Ambulatory Visit (INDEPENDENT_AMBULATORY_CARE_PROVIDER_SITE_OTHER): Payer: BC Managed Care – PPO

## 2021-10-19 ENCOUNTER — Other Ambulatory Visit (HOSPITAL_COMMUNITY)
Admission: RE | Admit: 2021-10-19 | Discharge: 2021-10-19 | Disposition: A | Discharge status: Home / Self Care | Payer: BC Managed Care – PPO | Source: Ambulatory Visit | Attending: Obstetrics and Gynecology | Admitting: Obstetrics and Gynecology

## 2021-10-19 ENCOUNTER — Encounter: Payer: Self-pay | Admitting: Anesthesiology

## 2021-10-19 ENCOUNTER — Encounter: Payer: Self-pay | Admitting: Obstetrics and Gynecology

## 2021-10-19 VITALS — BP 112/62 | HR 77 | Ht 64.0 in | Wt 189.0 lb

## 2021-10-19 DIAGNOSIS — Z30431 Encounter for routine checking of intrauterine contraceptive device: Secondary | ICD-10-CM | POA: Diagnosis not present

## 2021-10-19 DIAGNOSIS — N939 Abnormal uterine and vaginal bleeding, unspecified: Secondary | ICD-10-CM | POA: Insufficient documentation

## 2021-10-19 DIAGNOSIS — Z79810 Long term (current) use of selective estrogen receptor modulators (SERMs): Secondary | ICD-10-CM | POA: Diagnosis not present

## 2021-10-19 DIAGNOSIS — N83201 Unspecified ovarian cyst, right side: Secondary | ICD-10-CM

## 2021-10-19 NOTE — Patient Instructions (Signed)

## 2021-10-19 NOTE — Progress Notes (Signed)
GYNECOLOGY  VISIT ?  ?HPI: ?51 y.o.   Married White or Caucasian Not Hispanic or Latino  female   ?[id number] with Patient's last menstrual period was 10/02/2021 (exact date).   ?here for ultrasound consult. ?The patient has a mirena IUD, placed in 8/17.  ?She has a h/o DCIS, s/p lumpectomy, radiation and Tamoxifen. She was off Tamoxifen for a while, but restarted it in 12/22. She has been bleeding daily since then.  ? ?She doesn't have any other ? ?She was diagnosed with breast cancer in 4/21. ?She was having irregular cycles prior to that time. ? ?She is having hot flashes. Not having night sweats or vaginal dryness.  ? ?GYNECOLOGIC HISTORY: ?Patient's last menstrual period was 10/02/2021 (exact date). ?Contraception:IUD ?Menopausal hormone therapy: none  ?       ?OB History   ? ? Gravida  ?2  ? Para  ?2  ? Term  ?   ? Preterm  ?   ? AB  ?0  ? Living  ?2  ?  ? ? SAB  ?   ? IAB  ?   ? Ectopic  ?0  ? Multiple  ?   ? Live Births  ?   ?   ?  ?  ?    ? ?Patient Active Problem List  ? Diagnosis Date Noted  ? 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 IUD (intrauterine device) in place 02/23/2016  ? Mild dysplasia of cervix 03/17/2012  ? ? ?Past Medical History:  ?Diagnosis Date  ? Anxiety   ? 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 Colposcopy with ECC showed KA 2015  recommend followup Pap smear/HPV at her annual 2016   ? Asthma   ? no inhalers  ? Ductal carcinoma in situ (DCIS) of left breast   ? Joint pain   ? Migraine   ? Neurocardiogenic syncope   ? Personal history of radiation therapy   ? PONV (postoperative nausea and vomiting)   ? Restless leg syndrome   ? ? ?Past Surgical History:  ?Procedure Laterality Date  ? BREAST EXCISIONAL BIOPSY    ? BREAST LUMPECTOMY    ? BREAST LUMPECTOMY WITH RADIOACTIVE SEED LOCALIZATION Bilateral 01/29/2020  ? Procedure: BILATERAL BREAST LUMPECTOMY WITH RADIOACTIVE SEED LOCALIZATION;  Surgeon: Jovita Kussmaul, MD;  Location: [place];  Service: General;   Laterality: Bilateral;  ? COLONOSCOPY WITH PROPOFOL N/A 10/05/2021  ? Procedure: COLONOSCOPY WITH PROPOFOL;  Surgeon: Rogene Houston, MD;  [facility];  Service: Endoscopy;  Laterality: N/A;  1245  ? INTRAUTERINE DEVICE INSERTION  02/10/2016  ? Mirena  ? KNEE SURGERY    ? X 4 = 1X RIGHT 3X LEFT/3 ARTHROSCOPIES AND 1 ACL  ? KNEE SURGERY    ? ACL  ? MM BREAST STEREO BIOPSY LEFT ([facility] HX) Left   ? WISDOM TOOTH EXTRACTION    ? ? ?Current Outpatient Medications  ?Medication Sig Dispense Refill  ? albuterol (VENTOLIN HFA) 108 (90 Base) MCG/ACT inhaler Inhale 1-2 puffs into the lungs every 6 (six) hours as needed for wheezing or shortness of breath. 18 g 0  ? ALPRAZolam (XANAX) 0.5 MG tablet Take 0.5 mg by mouth at bedtime as needed for anxiety.    ? fluticasone (FLONASE) 50 MCG/ACT nasal spray Place 1 spray into both nostrils daily as needed for allergies or rhinitis.    ? ibuprofen (ADVIL) 200 MG tablet Take 200 mg by mouth every 6 (  six) hours as needed.    ? loratadine (CLARITIN) 10 MG tablet Take 10 mg by mouth daily.    ? magnesium oxide (MAG-OX) 400 MG tablet Take 400 mg by mouth daily.    ? sertraline (ZOLOFT) 50 MG tablet Take 50 mg by mouth daily.    ? tamoxifen (NOLVADEX) 10 MG tablet Take 0.5 tablets (5 mg total) by mouth 2 (two) times daily. (Patient taking differently: Take 5 mg by mouth daily.) 60 tablet 3  ? ?Current Facility-Administered Medications  ?Medication Dose Route Frequency Provider Last Rate Last Admin  ? levonorgestrel (MIRENA) 20 MCG/24HR IUD   Intrauterine Once Fontaine, Belinda Block, MD      ?  ? ?ALLERGIES: Diflucan [fluconazole], Mixed ragweed, and Sulfa antibiotics ? ?Family History  ?Problem Relation Age of Onset  ? COPD Father   ? CAD Father   ? Hyperlipidemia Brother   ? Hypertension Brother   ? Hyperlipidemia Brother   ? Osteoporosis Maternal Grandmother   ? Stroke Maternal Grandmother   ? Osteoporosis Paternal Grandmother   ? Cancer Paternal Grandfather   ?     COLON  ?  Cancer Paternal Aunt   ?     PRIMARY PERITONEAL CANCER  ? ? ?Social History  ? ?Socioeconomic History  ? Marital status: Married  ?  Spouse name: Octavia Bruckner  ? Number of children: 2  ? Years of education: Not on file  ? Highest education level: Associate degree: academic program  ?Occupational History  ?  Comment: working on [employer]  ?Tobacco Use  ? Smoking status: Former  ?  Types: Cigarettes  ?  Quit date: 07/09/1988  ?  Years since quitting: 33.3  ? Smokeless tobacco: Never  ?Vaping Use  ? Vaping Use: Never used  ?Substance and Sexual Activity  ? Alcohol use: Yes  ?  Comment: Occas  ? Drug use: No  ? Sexual activity: Yes  ?  Birth control/protection: I.U.D.  ?Other Topics Concern  ? Not on file  ?Social History Narrative  ? Lives with husband  ?  caffeine, tea x 1, soda x 1  ? ?Social Determinants of Health  ? ?Financial Resource Strain: Not on file  ?Food Insecurity: Not on file  ?Transportation Needs: Not on file  ?Physical Activity: Not on file  ?Stress: Not on file  ?Social Connections: Not on file  ?Intimate Partner Violence: Not on file  ? ? ?Review of Systems  ?All other systems reviewed and are negative. ? ?Pelvic ultrasound ? ?Indications: PMP bleeding, on tamoxifen ? ?Findings: ? ?Uterus 8.43 x 4.56 x 4.08 cm, anteverted ? ?Endometrium 11.83 cm, no obvious masses, IUD in place ? ?Left ovary not visualized ? ?Right ovary 2.45 x 1.84 x 1.00 ?Small avascular echogenic area, 0.7 x 0.4 x 0.6 cm ? ?No free fluid ? ?Sub optimal exam due to overlying bowel gas ? ?Impression:  ?Normal sized anteverted uterus ?Thickened endometrium ?IUD in place ?Left ovary not seen ?Right ovary with small avascular, echogenic area ? ?PHYSICAL EXAMINATION:   ? ?BP 112/62   Pulse 77   Ht '5\' 4"'$  (1.626 m)   Wt 189 lb (85.7 kg)   LMP 10/02/2021 (Exact Date)   SpO2 99%   BMI 32.44 kg/m?     ?General appearance: alert, cooperative and appears stated age ? ?Pelvic: External genitalia:  no lesions ?             Urethra:  normal appearing urethra  with no masses, tenderness or lesions ?  Bartholins and Skenes: normal    ?             Vagina: normal appearing vagina with normal color and discharge, no lesions ?             Cervix: no lesions ?             ?The risks of endometrial biopsy were reviewed and a consent was obtained.  ?A speculum was placed in the vagina and the cervix was cleansed with betadine. A pipelle was placed into the endometrial cavity. The uterus sounded to 7 cm. The endometrial biopsy was performed, taking care to get a representative sample, sampling 360 degrees of the uterine cavity. Moderate tissue was obtained. The speculum was removed. There were no complications.  ? ?Chaperone was present for exam. ? ?1. Abnormal uterine bleeding ?On tamoxifen with a mirena IUD. Thickened endometrium on ultrasound ?- Surgical pathology( [place]/ [facility]) ? ?2. Use of tamoxifen (Nolvadex) ?- Surgical pathology( [place]/ [facility]) ? ?3. IUD check up ?We discussed that the mirena has progesterone in it. I need to confirm with her Oncologist that he is okay with her using the Angelina IUD. ? ?4. Cyst of right ovary ?Suspect resolving cyst, recommend f/u u/s in 2-3 months ?- US PELVIS TRANSVAGINAL NON-OB (TV ONLY); Future ? ?CC: Dr Nicholas Lose ?

## 2021-10-23 LAB — SURGICAL PATHOLOGY

## 2021-10-24 ENCOUNTER — Other Ambulatory Visit: Payer: Self-pay

## 2021-10-24 ENCOUNTER — Other Ambulatory Visit: Payer: BC Managed Care – PPO

## 2021-10-24 DIAGNOSIS — N951 Menopausal and female climacteric states: Secondary | ICD-10-CM

## 2021-10-25 ENCOUNTER — Telehealth: Payer: Self-pay | Admitting: Obstetrics and Gynecology

## 2021-10-25 ENCOUNTER — Encounter: Payer: Self-pay | Admitting: Obstetrics and Gynecology

## 2021-10-25 LAB — FOLLICLE STIMULATING HORMONE: FSH: 50.8 m[IU]/mL

## 2021-10-25 NOTE — Telephone Encounter (Signed)
Patient sent the above message replying to your my chart message below.   ? ?Hi Pamela Martinez, ?Your [place] is in a menopausal range. This means you are either perimenopausal or postmenopausal. When you are perimenopausal this # can go up and down. ?Please reach out with any questions. ?Best, ?Sumner Boast ?

## 2021-10-25 NOTE — Telephone Encounter (Signed)
Surgery: CPT [phone] - Hysteroscopy/D&C/Myosure,  removal of IUD ? ?Diagnosis: N95.0 Postmenopausal Bleeding, R93.89 Thickened Endometrium,  ? ?Location: [facility] ? ?Status: Outpatient ? ?Time: 30 Minutes ? ?Assistant: N/A ? ?Urgency: At Patient's Convenience ? ?Pre-Op Appointment: To Be Scheduled ? ?Post-Op Appointment(s): 1 Week,  ? ?Time Out Of Work: Day Of Surgery, 1 Day Post Op ? ?

## 2021-10-26 NOTE — Telephone Encounter (Signed)
Spoke with patient. Reviewed surgery dates. Patient request to proceed with surgery on 11/27/21.  Patient would like to know if response received from oncology regarding replacement of Mirena IUD? Advised patient I will review with Dr. Talbert Nan and f/u once response received.  ? ?Advised patient I will forward to business office for return call. I will return call once surgery date and time confirmed. Patient verbalizes understanding and is agreeable.  ? ?Dr. Talbert Nan -please advise on replacement IUD.  ? ?Cc: Hayley Carder ? ? ? ?

## 2021-10-31 NOTE — Telephone Encounter (Signed)
Please let the patient know that I have sent Dr Lindi Adie another message asking his opinion about the IUD. Given her elevated FSH, my instinct would be to leave the IUD out but we can see what Dr Lindi Adie says.  ?

## 2021-10-31 NOTE — Telephone Encounter (Signed)
Spoke with patient, advised per Dr. Talbert Nan. Patient aware I will f/u with recommendations once received.  ?

## 2021-11-06 NOTE — Telephone Encounter (Signed)
Pamela Dom, MD  Burnice Logan, RN ?Please let the patient know that Dr Lindi Adie is okay with her having a mirena as long as she is on Tamoxifen. We can discuss it further at her preop visit.  ?Thanks,  ?[name]  ? ? ?Spoke with patient, advised per Dr. Talbert Nan. Patient request to proceed with Hysteroscopy D&C Myosure with IUD removal and insertion of Mirena IUD on 11/27/21. Pre-op scheduled. Advised patient I will update business office and I will return call once surgery date/time confirmed. Patient verbalizes understanding and is agreeable.  ? ?Surgery request sent.  ? ?Routing to [facility] for benefits.  ?

## 2021-11-07 NOTE — Telephone Encounter (Signed)
Spoke with patient regarding surgery benefits. Patient acknowledges understanding of information presented. Patient is aware that benefits presented are professional benefits only. Patient is aware the hospital will call with facility benefits. See account note.  Routing to Jill Hamm, RN.  

## 2021-11-08 NOTE — Telephone Encounter (Signed)
Spoke with patient. Surgery date request confirmed.  ?Advised surgery is scheduled for 11/27/21, [facility] at 0915. ?Surgery instruction sheet and hospital brochure reviewed, printed copy will be mailed.  ?Patient verbalizes understanding and is agreeable.  ?Routing to provider. Encounter closed.  ?Cc: Hayley C ?

## 2021-11-13 ENCOUNTER — Encounter: Payer: Self-pay | Admitting: Obstetrics and Gynecology

## 2021-11-13 ENCOUNTER — Ambulatory Visit (INDEPENDENT_AMBULATORY_CARE_PROVIDER_SITE_OTHER): Payer: BC Managed Care – PPO | Admitting: Obstetrics and Gynecology

## 2021-11-13 VITALS — BP 110/70 | HR 72 | Ht 64.25 in | Wt 192.0 lb

## 2021-11-13 DIAGNOSIS — Z79810 Long term (current) use of selective estrogen receptor modulators (SERMs): Secondary | ICD-10-CM

## 2021-11-13 DIAGNOSIS — N939 Abnormal uterine and vaginal bleeding, unspecified: Secondary | ICD-10-CM

## 2021-11-13 DIAGNOSIS — R9389 Abnormal findings on diagnostic imaging of other specified body structures: Secondary | ICD-10-CM | POA: Diagnosis not present

## 2021-11-13 NOTE — H&P (View-Only) (Signed)
GYNECOLOGY  VISIT   HPI: 51 y.o.   Married White or Caucasian Not Hispanic or Latino  female   [phone] with No LMP recorded. (Menstrual status: IUD).   here for pre op for D&C. The patient presented with abnormal uterine bleeding on tamoxifen with a mirena IUD in.  U/S with a thickened stripe of 11.83 cm, endometrial biopsy with inactive endometrium.  She has a h/o DCIS, s/p lumpectomy, radiation in 2021, she is on Tamoxifen.  The patient has a mirena IUD, placed in 8/17.  Recent FSH was 50.8  GYNECOLOGIC HISTORY: No LMP recorded. (Menstrual status: IUD). 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   IUD (intrauterine device) in place 02/23/2016   Mild dysplasia of cervix 03/17/2012    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, 02/2014   Colposcopy with ECC showed KA 2015  recommend followup Pap smear/HPV at her annual 2016    Asthma    no inhalers   Ductal carcinoma in situ (DCIS) of left breast    Joint pain    Migraine    Neurocardiogenic syncope    Personal history of radiation therapy    PONV (postoperative nausea and vomiting)    Restless leg syndrome     Past Surgical History:  Procedure Laterality Date   BREAST EXCISIONAL BIOPSY     BREAST LUMPECTOMY     BREAST LUMPECTOMY WITH RADIOACTIVE SEED LOCALIZATION Bilateral 01/29/2020   Procedure: BILATERAL BREAST LUMPECTOMY WITH RADIOACTIVE SEED LOCALIZATION;  Surgeon: Jovita Kussmaul, MD;  Location: [place];  Service: General;  Laterality: Bilateral;   COLONOSCOPY WITH PROPOFOL N/A 10/05/2021   Procedure: COLONOSCOPY WITH PROPOFOL;  Surgeon: Rogene Houston, MD;   [facility];  Service: Endoscopy;  Laterality: N/A;  1245   INTRAUTERINE DEVICE INSERTION  02/10/2016   Mirena   KNEE SURGERY     X 4 = 1X RIGHT 3X LEFT/3 ARTHROSCOPIES AND 1 ACL   KNEE SURGERY     ACL   MM BREAST STEREO BIOPSY LEFT ([facility] HX) Left    WISDOM TOOTH EXTRACTION      Current Outpatient Medications  Medication Sig Dispense Refill   albuterol (VENTOLIN HFA) 108 (90 Base) MCG/ACT inhaler Inhale 1-2 puffs into the lungs every 6 (six) hours as needed for wheezing or shortness of breath. 18 g 0   ALPRAZolam (XANAX) 0.5 MG tablet Take 0.5 mg by mouth at bedtime as needed for anxiety.     fluticasone (FLONASE) 50 MCG/ACT nasal spray Place 1 spray into both nostrils daily as needed for allergies or rhinitis.     ibuprofen (ADVIL) 200 MG tablet Take 200 mg by mouth every 6 (six) hours as needed.     loratadine (CLARITIN) 10 MG tablet Take 10 mg by mouth daily.  magnesium oxide (MAG-OX) 400 MG tablet Take 400 mg by mouth daily.     sertraline (ZOLOFT) 50 MG tablet Take 50 mg by mouth daily.     tamoxifen (NOLVADEX) 10 MG tablet Take 0.5 tablets (5 mg total) by mouth 2 (two) times daily. (Patient taking differently: Take 5 mg by mouth daily.) 60 tablet 3   Current Facility-Administered Medications  Medication Dose Route Frequency Provider Last Rate Last Admin   levonorgestrel (MIRENA) 20 MCG/24HR IUD   Intrauterine Once Fontaine, Belinda Block, MD         ALLERGIES: Diflucan [fluconazole], Mixed ragweed, and Sulfa antibiotics  Family History  Problem Relation Age of Onset   COPD Father    CAD Father    Hyperlipidemia Brother    Hypertension Brother    Hyperlipidemia Brother    Osteoporosis Maternal Grandmother    Stroke Maternal Grandmother    Osteoporosis Paternal Grandmother    Cancer Paternal Grandfather        COLON   Cancer Paternal Aunt        PRIMARY PERITONEAL CANCER    Social History   Socioeconomic History   Marital status: Married    Spouse  name: Tim   Number of children: 2   Years of education: Not on file   Highest education level: Associate degree: academic program  Occupational History    Comment: working on [employer]  Tobacco Use   Smoking status: Former    Types: Cigarettes    Quit date: 07/09/1988    Years since quitting: 33.3   Smokeless tobacco: Never  Vaping Use   Vaping Use: Never used  Substance and Sexual Activity   Alcohol use: Yes    Comment: Occas   Drug use: No   Sexual activity: Yes    Birth control/protection: I.U.D.  Other Topics Concern   Not on file  Social History Narrative   Lives with husband    caffeine, tea x 1, soda x 1   Social Determinants of Health   Financial Resource Strain: Not on file  Food Insecurity: Not on file  Transportation Needs: Not on file  Physical Activity: Not on file  Stress: Not on file  Social Connections: Not on file  Intimate Partner Violence: Not on file    Review of Systems  All other systems reviewed and are negative.  PHYSICAL EXAMINATION:    BP 110/70   Pulse 72   Ht 5' 4.25" (1.632 m)   Wt 192 lb (87.1 kg)   SpO2 99%   BMI 32.70 kg/m     General appearance: alert, cooperative and appears stated age Neck: no adenopathy, supple, symmetrical, trachea midline and thyroid normal to inspection and palpation Heart: regular rate and rhythm Lungs: CTAB Abdomen: soft, non-tender; bowel sounds normal; no masses,  no organomegaly Extremities: normal, atraumatic, no cyanosis Skin: normal color, texture and turgor, no rashes or lesions Lymph: normal cervical supraclavicular and inguinal nodes Neurologic: grossly normal   1. Abnormal uterine bleeding With IUD, on tamoxifen. Thickened stripe on U/S, endometrial biopsy with atrophy. Concerning for an endometrial polyp. Plan: removal of iud, hysteroscopy, dilation and curettage, insertion of new mirena IUD. Reviewed risks, including: bleeding, infection, uterine perforation, fluid overload, need for further  sugery We discussed the option of the mirena IUD, suspect she is perimenopausal. Oncology is okay with her having the mirena as long as she is on tamoxifen  2. Use of tamoxifen (Nolvadex)  3. Thickened endometrium

## 2021-11-13 NOTE — Progress Notes (Signed)
GYNECOLOGY  VISIT ?  ?HPI: ?51 y.o.   Married White or Caucasian Not Hispanic or Latino  female   ?[id number] with No LMP recorded. (Menstrual status: IUD).   ?here for pre op for D&C. The patient presented with abnormal uterine bleeding on tamoxifen with a mirena IUD in.  ?U/S with a thickened stripe of 11.83 cm, endometrial biopsy with inactive endometrium. ? ?She has a h/o DCIS, s/p lumpectomy, radiation in 2021, she is on Tamoxifen. ? ?The patient has a mirena IUD, placed in 8/17. ? ?Recent FSH was 50.8 ? ?GYNECOLOGIC HISTORY: ?No LMP recorded. (Menstrual status: IUD). ?Contraception:IUD  ?Menopausal hormone therapy: none  ?       ?OB History   ? ? Gravida  ?2  ? Para  ?2  ? Term  ?   ? Preterm  ?   ? AB  ?0  ? Living  ?2  ?  ? ? SAB  ?   ? IAB  ?   ? Ectopic  ?0  ? Multiple  ?   ? Live Births  ?   ?   ?  ?  ?    ? ?Patient Active Problem List  ? Diagnosis Date Noted  ? 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 IUD (intrauterine device) in place 02/23/2016  ? Mild dysplasia of cervix 03/17/2012  ? ? ?Past Medical History:  ?Diagnosis Date  ? Anxiety   ? 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 Colposcopy with ECC showed KA 2015  recommend followup Pap smear/HPV at her annual 2016   ? Asthma   ? no inhalers  ? Ductal carcinoma in situ (DCIS) of left breast   ? Joint pain   ? Migraine   ? Neurocardiogenic syncope   ? Personal history of radiation therapy   ? PONV (postoperative nausea and vomiting)   ? Restless leg syndrome   ? ? ?Past Surgical History:  ?Procedure Laterality Date  ? BREAST EXCISIONAL BIOPSY    ? BREAST LUMPECTOMY    ? BREAST LUMPECTOMY WITH RADIOACTIVE SEED LOCALIZATION Bilateral 01/29/2020  ? Procedure: BILATERAL BREAST LUMPECTOMY WITH RADIOACTIVE SEED LOCALIZATION;  Surgeon: Jovita Kussmaul, MD;  Location: [place];  Service: General;  Laterality: Bilateral;  ? COLONOSCOPY WITH PROPOFOL N/A 10/05/2021  ? Procedure: COLONOSCOPY WITH PROPOFOL;  Surgeon: Rogene Houston, MD;   [facility];  Service: Endoscopy;  Laterality: N/A;  1245  ? INTRAUTERINE DEVICE INSERTION  02/10/2016  ? Mirena  ? KNEE SURGERY    ? X 4 = 1X RIGHT 3X LEFT/3 ARTHROSCOPIES AND 1 ACL  ? KNEE SURGERY    ? ACL  ? MM BREAST STEREO BIOPSY LEFT ([facility] HX) Left   ? WISDOM TOOTH EXTRACTION    ? ? ?Current Outpatient Medications  ?Medication Sig Dispense Refill  ? albuterol (VENTOLIN HFA) 108 (90 Base) MCG/ACT inhaler Inhale 1-2 puffs into the lungs every 6 (six) hours as needed for wheezing or shortness of breath. 18 g 0  ? ALPRAZolam (XANAX) 0.5 MG tablet Take 0.5 mg by mouth at bedtime as needed for anxiety.    ? fluticasone (FLONASE) 50 MCG/ACT nasal spray Place 1 spray into both nostrils daily as needed for allergies or rhinitis.    ? ibuprofen (ADVIL) 200 MG tablet Take 200 mg by mouth every 6 (six) hours as needed.    ? loratadine (CLARITIN) 10 MG tablet Take 10 mg by mouth daily.    ?  magnesium oxide (MAG-OX) 400 MG tablet Take 400 mg by mouth daily.    ? sertraline (ZOLOFT) 50 MG tablet Take 50 mg by mouth daily.    ? tamoxifen (NOLVADEX) 10 MG tablet Take 0.5 tablets (5 mg total) by mouth 2 (two) times daily. (Patient taking differently: Take 5 mg by mouth daily.) 60 tablet 3  ? ?Current Facility-Administered Medications  ?Medication Dose Route Frequency Provider Last Rate Last Admin  ? levonorgestrel (MIRENA) 20 MCG/24HR IUD   Intrauterine Once Fontaine, Belinda Block, MD      ?  ? ?ALLERGIES: Diflucan [fluconazole], Mixed ragweed, and Sulfa antibiotics ? ?Family History  ?Problem Relation Age of Onset  ? COPD Father   ? CAD Father   ? Hyperlipidemia Brother   ? Hypertension Brother   ? Hyperlipidemia Brother   ? Osteoporosis Maternal Grandmother   ? Stroke Maternal Grandmother   ? Osteoporosis Paternal Grandmother   ? Cancer Paternal Grandfather   ?     COLON  ? Cancer Paternal Aunt   ?     PRIMARY PERITONEAL CANCER  ? ? ?Social History  ? ?Socioeconomic History  ? Marital status: Married  ?  Spouse  name: Octavia Bruckner  ? Number of children: 2  ? Years of education: Not on file  ? Highest education level: Associate degree: academic program  ?Occupational History  ?  Comment: working on [employer]  ?Tobacco Use  ? Smoking status: Former  ?  Types: Cigarettes  ?  Quit date: 07/09/1988  ?  Years since quitting: 33.3  ? Smokeless tobacco: Never  ?Vaping Use  ? Vaping Use: Never used  ?Substance and Sexual Activity  ? Alcohol use: Yes  ?  Comment: Occas  ? Drug use: No  ? Sexual activity: Yes  ?  Birth control/protection: I.U.D.  ?Other Topics Concern  ? Not on file  ?Social History Narrative  ? Lives with husband  ?  caffeine, tea x 1, soda x 1  ? ?Social Determinants of Health  ? ?Financial Resource Strain: Not on file  ?Food Insecurity: Not on file  ?Transportation Needs: Not on file  ?Physical Activity: Not on file  ?Stress: Not on file  ?Social Connections: Not on file  ?Intimate Partner Violence: Not on file  ? ? ?Review of Systems  ?All other systems reviewed and are negative. ? ?PHYSICAL EXAMINATION:   ? ?BP 110/70   Pulse 72   Ht 5' 4.25" (1.632 m)   Wt 192 lb (87.1 kg)   SpO2 99%   BMI 32.70 kg/m?     ?General appearance: alert, cooperative and appears stated age ?Neck: no adenopathy, supple, symmetrical, trachea midline and thyroid normal to inspection and palpation ?Heart: regular rate and rhythm ?Lungs: CTAB ?Abdomen: soft, non-tender; bowel sounds normal; no masses,  no organomegaly ?Extremities: normal, atraumatic, no cyanosis ?Skin: normal color, texture and turgor, no rashes or lesions ?Lymph: normal cervical supraclavicular and inguinal nodes ?Neurologic: grossly normal ? ? ?1. Abnormal uterine bleeding ?With IUD, on tamoxifen. Thickened stripe on U/S, endometrial biopsy with atrophy. Concerning for an endometrial polyp. ?Plan: removal of iud, hysteroscopy, dilation and curettage, insertion of new mirena IUD. Reviewed risks, including: bleeding, infection, uterine perforation, fluid overload, need for further  sugery ?We discussed the option of the mirena IUD, suspect she is perimenopausal. Oncology is okay with her having the mirena as long as she is on tamoxifen ? ?2. Use of tamoxifen (Nolvadex) ? ?3. Thickened endometrium ? ?

## 2021-11-23 ENCOUNTER — Other Ambulatory Visit: Payer: Self-pay

## 2021-11-23 ENCOUNTER — Encounter (HOSPITAL_BASED_OUTPATIENT_CLINIC_OR_DEPARTMENT_OTHER): Payer: Self-pay | Admitting: Obstetrics and Gynecology

## 2021-11-23 NOTE — Progress Notes (Addendum)
Spoke w/ via phone for pre-op interview--- pt Lab needs dos----  urine preg (per anes)/  pre-op orders pending             Lab results------ n/a COVID test -----patient states asymptomatic no test needed Arrive at ------- 0700 on 11-27-2021 NPO after MN NO Solid Food.  Clear liquids from MN until--- 0600 Med rec completed Medications to take morning of surgery ----- prevacid Diabetic medication ----- n/a Patient instructed no nail polish to be worn day of surgery Patient instructed to bring photo id and insurance card day of surgery Patient aware to have Driver (ride ) / caregiver for 24 hours after surgery --husband, Pamela Martinez Patient Special Instructions -----asked to bring rescue inhaler dos Pre-Op special Istructions ----- sent inbox message to in epic to dr Talbert Nan , requested orders Patient verbalized understanding of instructions that were given at this phone interview. Patient denies shortness of breath, chest pain, fever, cough at this phone interview.

## 2021-11-27 ENCOUNTER — Ambulatory Visit (HOSPITAL_BASED_OUTPATIENT_CLINIC_OR_DEPARTMENT_OTHER): Payer: BC Managed Care – PPO | Admitting: Anesthesiology

## 2021-11-27 ENCOUNTER — Encounter (HOSPITAL_BASED_OUTPATIENT_CLINIC_OR_DEPARTMENT_OTHER)
Admission: RE | Disposition: A | Discharge status: Home / Self Care | Payer: Self-pay | Source: Home / Self Care | Attending: Obstetrics and Gynecology

## 2021-11-27 ENCOUNTER — Ambulatory Visit (HOSPITAL_BASED_OUTPATIENT_CLINIC_OR_DEPARTMENT_OTHER)
Admission: RE | Admit: 2021-11-27 | Discharge: 2021-11-27 | Disposition: A | Discharge status: Home / Self Care | Payer: BC Managed Care – PPO | Attending: Obstetrics and Gynecology | Admitting: Obstetrics and Gynecology

## 2021-11-27 ENCOUNTER — Encounter: Payer: Self-pay | Admitting: Obstetrics and Gynecology

## 2021-11-27 ENCOUNTER — Encounter (HOSPITAL_BASED_OUTPATIENT_CLINIC_OR_DEPARTMENT_OTHER): Payer: Self-pay | Admitting: Obstetrics and Gynecology

## 2021-11-27 ENCOUNTER — Other Ambulatory Visit: Payer: Self-pay

## 2021-11-27 DIAGNOSIS — Z01818 Encounter for other preprocedural examination: Secondary | ICD-10-CM

## 2021-11-27 DIAGNOSIS — Z853 Personal history of malignant neoplasm of breast: Secondary | ICD-10-CM | POA: Insufficient documentation

## 2021-11-27 DIAGNOSIS — N950 Postmenopausal bleeding: Secondary | ICD-10-CM | POA: Insufficient documentation

## 2021-11-27 DIAGNOSIS — R9389 Abnormal findings on diagnostic imaging of other specified body structures: Secondary | ICD-10-CM | POA: Insufficient documentation

## 2021-11-27 DIAGNOSIS — J45909 Unspecified asthma, uncomplicated: Secondary | ICD-10-CM | POA: Insufficient documentation

## 2021-11-27 DIAGNOSIS — Z30430 Encounter for insertion of intrauterine contraceptive device: Secondary | ICD-10-CM | POA: Diagnosis not present

## 2021-11-27 DIAGNOSIS — Z79810 Long term (current) use of selective estrogen receptor modulators (SERMs): Secondary | ICD-10-CM | POA: Diagnosis not present

## 2021-11-27 DIAGNOSIS — N939 Abnormal uterine and vaginal bleeding, unspecified: Secondary | ICD-10-CM | POA: Diagnosis not present

## 2021-11-27 DIAGNOSIS — N840 Polyp of corpus uteri: Secondary | ICD-10-CM | POA: Insufficient documentation

## 2021-11-27 DIAGNOSIS — Z975 Presence of (intrauterine) contraceptive device: Secondary | ICD-10-CM | POA: Insufficient documentation

## 2021-11-27 DIAGNOSIS — Z87891 Personal history of nicotine dependence: Secondary | ICD-10-CM | POA: Insufficient documentation

## 2021-11-27 DIAGNOSIS — Z30432 Encounter for removal of intrauterine contraceptive device: Secondary | ICD-10-CM | POA: Diagnosis not present

## 2021-11-27 DIAGNOSIS — Z923 Personal history of irradiation: Secondary | ICD-10-CM | POA: Diagnosis not present

## 2021-11-27 HISTORY — DX: Presence of spectacles and contact lenses: Z97.3

## 2021-11-27 HISTORY — PX: DILATATION & CURETTAGE/HYSTEROSCOPY WITH MYOSURE: SHX6511

## 2021-11-27 HISTORY — DX: Generalized anxiety disorder: F41.1

## 2021-11-27 HISTORY — PX: IUD REMOVAL: SHX5392

## 2021-11-27 HISTORY — PX: INTRAUTERINE DEVICE (IUD) INSERTION: SHX5877

## 2021-11-27 LAB — POCT PREGNANCY, URINE: Preg Test, Ur: NEGATIVE

## 2021-11-27 SURGERY — DILATATION & CURETTAGE/HYSTEROSCOPY WITH MYOSURE
Anesthesia: General

## 2021-11-27 MED ORDER — PHENYLEPHRINE 80 MCG/ML (10ML) SYRINGE FOR IV PUSH (FOR BLOOD PRESSURE SUPPORT)
PREFILLED_SYRINGE | INTRAVENOUS | Status: AC
  Filled 2021-11-27: qty 10

## 2021-11-27 MED ORDER — ONDANSETRON HCL 4 MG/2ML IJ SOLN
INTRAMUSCULAR | Status: DC | PRN
  Administered 2021-11-27: 4 mg via INTRAVENOUS

## 2021-11-27 MED ORDER — ACETAMINOPHEN 500 MG PO TABS
ORAL_TABLET | ORAL | Status: AC
  Filled 2021-11-27: qty 2

## 2021-11-27 MED ORDER — LACTATED RINGERS IV SOLN: INTRAVENOUS | Status: DC

## 2021-11-27 MED ORDER — LEVONORGESTREL 20 MCG/DAY IU IUD
INTRAUTERINE_SYSTEM | INTRAUTERINE | Status: AC
Start: 2021-11-27 — End: ?
  Filled 2021-11-27: qty 1

## 2021-11-27 MED ORDER — PROPOFOL 10 MG/ML IV BOLUS
INTRAVENOUS | Status: DC | PRN
  Administered 2021-11-27: 200 mg via INTRAVENOUS

## 2021-11-27 MED ORDER — LIDOCAINE 2% (20 MG/ML) 5 ML SYRINGE
INTRAMUSCULAR | Status: DC | PRN
  Administered 2021-11-27: 60 mg via INTRAVENOUS

## 2021-11-27 MED ORDER — KETOROLAC TROMETHAMINE 30 MG/ML IJ SOLN
INTRAMUSCULAR | Status: DC | PRN
Start: 2021-11-27 — End: 2021-11-27
  Administered 2021-11-27: 30 mg via INTRAVENOUS

## 2021-11-27 MED ORDER — PHENYLEPHRINE 80 MCG/ML (10ML) SYRINGE FOR IV PUSH (FOR BLOOD PRESSURE SUPPORT)
PREFILLED_SYRINGE | INTRAVENOUS | Status: DC | PRN
  Administered 2021-11-27: 160 ug via INTRAVENOUS

## 2021-11-27 MED ORDER — ONDANSETRON HCL 4 MG/2ML IJ SOLN
INTRAMUSCULAR | Status: AC
  Filled 2021-11-27: qty 2

## 2021-11-27 MED ORDER — SODIUM CHLORIDE 0.9 % IV SOLN
INTRAVENOUS | Status: AC | PRN
  Administered 2021-11-27: 3000 mL

## 2021-11-27 MED ORDER — FENTANYL CITRATE (PF) 100 MCG/2ML IJ SOLN: 25.0000 ug | INTRAMUSCULAR | Status: DC | PRN

## 2021-11-27 MED ORDER — PROPOFOL 10 MG/ML IV BOLUS
INTRAVENOUS | Status: AC
  Filled 2021-11-27: qty 20

## 2021-11-27 MED ORDER — MIDAZOLAM HCL 5 MG/5ML IJ SOLN
INTRAMUSCULAR | Status: DC | PRN
  Administered 2021-11-27: 2 mg via INTRAVENOUS

## 2021-11-27 MED ORDER — ACETAMINOPHEN 500 MG PO TABS
1000.0000 mg | ORAL_TABLET | Freq: Once | ORAL | Status: AC
  Administered 2021-11-27: 1000 mg via ORAL

## 2021-11-27 MED ORDER — LEVONORGESTREL 20 MCG/DAY IU IUD
1.0000 | INTRAUTERINE_SYSTEM | INTRAUTERINE | Status: AC
  Administered 2021-11-27: 1 via INTRAUTERINE

## 2021-11-27 MED ORDER — FENTANYL CITRATE (PF) 100 MCG/2ML IJ SOLN
INTRAMUSCULAR | Status: DC | PRN
  Administered 2021-11-27: 50 ug via INTRAVENOUS

## 2021-11-27 MED ORDER — DEXAMETHASONE SODIUM PHOSPHATE 10 MG/ML IJ SOLN
INTRAMUSCULAR | Status: DC | PRN
  Administered 2021-11-27: 10 mg via INTRAVENOUS

## 2021-11-27 MED ORDER — DEXAMETHASONE SODIUM PHOSPHATE 10 MG/ML IJ SOLN
INTRAMUSCULAR | Status: AC
  Filled 2021-11-27: qty 1

## 2021-11-27 MED ORDER — MIDAZOLAM HCL 2 MG/2ML IJ SOLN
INTRAMUSCULAR | Status: AC
  Filled 2021-11-27: qty 2

## 2021-11-27 MED ORDER — FENTANYL CITRATE (PF) 100 MCG/2ML IJ SOLN
INTRAMUSCULAR | Status: AC
  Filled 2021-11-27: qty 2

## 2021-11-27 SURGICAL SUPPLY — 11 items
DEVICE MYOSURE REACH (MISCELLANEOUS) ×1 IMPLANT
GLOVE BIO SURGEON STRL SZ 6.5 (GLOVE) ×3 IMPLANT
GOWN STRL REUS W/TWL LRG LVL3 (GOWN DISPOSABLE) ×3 IMPLANT
IV NS IRRIG 3000ML ARTHROMATIC (IV SOLUTION) ×3 IMPLANT
KIT PROCEDURE FLUENT (KITS) ×3 IMPLANT
KIT TURNOVER CYSTO (KITS) ×3 IMPLANT
PACK VAGINAL MINOR WOMEN LF (CUSTOM PROCEDURE TRAY) ×3 IMPLANT
PAD OB MATERNITY 4.3X12.25 (PERSONAL CARE ITEMS) ×3 IMPLANT
PAD PREP 24X48 CUFFED NSTRL (MISCELLANEOUS) ×3 IMPLANT
SEAL ROD LENS SCOPE MYOSURE (ABLATOR) ×3 IMPLANT
TOWEL OR 17X26 10 PK STRL BLUE (TOWEL DISPOSABLE) ×5 IMPLANT

## 2021-11-27 NOTE — Anesthesia Procedure Notes (Signed)
Procedure Name: LMA Insertion Date/Time: 11/27/2021 9:15 AM Performed by: Rogers Blocker, CRNA Pre-anesthesia Checklist: Patient identified, Emergency Drugs available, Suction available and Patient being monitored Patient Re-evaluated:Patient Re-evaluated prior to induction Oxygen Delivery Method: Circle System Utilized Preoxygenation: Pre-oxygenation with 100% oxygen Induction Type: IV induction Ventilation: Mask ventilation without difficulty LMA: LMA inserted LMA Size: 4.0 Number of attempts: 1 Placement Confirmation: positive ETCO2 Tube secured with: Tape Dental Injury: Teeth and Oropharynx as per pre-operative assessment

## 2021-11-27 NOTE — Anesthesia Preprocedure Evaluation (Addendum)
Anesthesia Evaluation  Patient identified by MRN, date of birth, ID band Patient awake    Reviewed: Allergy & Precautions, NPO status , Patient's Chart, lab work & pertinent test results  History of Anesthesia Complications (+) PONV and history of anesthetic complications  Airway Mallampati: II  TM Distance: >3 FB Neck ROM: Full    Dental no notable dental hx. (+) Teeth Intact, Dental Advisory Given   Pulmonary asthma , former smoker,    Pulmonary exam normal breath sounds clear to auscultation       Cardiovascular negative cardio ROS Normal cardiovascular exam Rhythm:Regular Rate:Normal     Neuro/Psych  Headaches, PSYCHIATRIC DISORDERS Anxiety    GI/Hepatic negative GI ROS, Neg liver ROS,   Endo/Other  negative endocrine ROS  Renal/GU negative Renal ROS  negative genitourinary   Musculoskeletal negative musculoskeletal ROS (+)   Abdominal   Peds  Hematology negative hematology ROS (+)   Anesthesia Other Findings   Reproductive/Obstetrics                            Anesthesia Physical Anesthesia Plan  ASA: 2  Anesthesia Plan: General   Post-op Pain Management:    Induction: Intravenous  PONV Risk Score and Plan: 4 or greater and Ondansetron, Dexamethasone, Midazolam and Treatment may vary due to age or medical condition  Airway Management Planned: LMA  Additional Equipment:   Intra-op Plan:   Post-operative Plan: Extubation in OR  Informed Consent: I have reviewed the patients History and Physical, chart, labs and discussed the procedure including the risks, benefits and alternatives for the proposed anesthesia with the patient or authorized representative who has indicated his/her understanding and acceptance.     Dental advisory given  Plan Discussed with: CRNA  Anesthesia Plan Comments:        Anesthesia Quick Evaluation

## 2021-11-27 NOTE — Transfer of Care (Signed)
Immediate Anesthesia Transfer of Care Note  Patient: Pamela Martinez  Procedure(s) Performed: DILATATION & CURETTAGE/HYSTEROSCOPY WITH [place] (IUD) INSERTION INTRAUTERINE DEVICE (IUD) REMOVAL  Patient Location: PACU  Anesthesia Type:General  Level of Consciousness: drowsy and responds to stimulation  Airway & Oxygen Therapy: Patient Spontanous Breathing  Post-op Assessment: Report given to RN and Post -op Vital signs reviewed and stable  Post vital signs: Reviewed and stable  Last Vitals:  Vitals Value Taken Time  BP 121/81 11/27/21 0944  Temp    Pulse 64 11/27/21 0944  Resp 15 11/27/21 0944  SpO2 95 % 11/27/21 0944  Vitals shown include unvalidated device data.  Last Pain:  Vitals:   11/27/21 0657  TempSrc: Oral  PainSc: 0-No pain      Patients Stated Pain Goal: 6 (25/27/12 9290)  Complications: No notable events documented.

## 2021-11-27 NOTE — Op Note (Addendum)
Preoperative Diagnosis: abnormal uterine bleeding, thickened endometrium, on tamoxifen  Postoperative Diagnosis: abnormal uterine bleeding, thickened endometrium, on tamoxifen, endometrial polyp  Procedure: Removal of intrauterine device, hysteroscopy, polypectomy, dilation and curettage, mirena intrauterine device insertion  Surgeon: Dr Sumner Boast  Assistants: None  Anesthesia: General via LMA  EBL: 5 cc  Fluids: 600 cc  Fluid deficit: 75 cc  Urine output: not recorded  Indications for surgery: The patient is a 51 yo female with a history of breast cancer on tamoxifen presented with abnormal uterine bleeding with a mirena intrauterine device in place. Ultrasound showed a thickened endometrium of 11.83 mm.  The risks of the surgery were reviewed with the patient and the consent form was signed prior to her surgery.  Findings: Hysteroscopy: endometrial polyp, normal tubal ostia bilaterally, otherwise normal endometrial cavity  Specimens: endometrial polyp, endometrial curettings.    Procedure: The patient was taken to the operating room with an IV in place. She was placed in the dorsal lithotomy position and anesthesia was administered. She was prepped and draped in the usual sterile fashion for a vaginal procedure. She voided on the way to the OR. A weighted speculum was placed in the vagina and a single tooth tenaculum was placed on the anterior lip of the cervix. The intrauterine device was removed. The cervix was dilated to a #6 hagar dilator. The uterus was sounded to 8 cm. The myosure hysteroscope was inserted into the uterine cavity. With continuous infusion of normal saline, the uterine cavity was visualized with the above findings. The myosure reach was used to resect the polyp. The myosure was then removed. The cavity was then curetted with the small sharp curette. The cavity had the characteristically gritty texture at the end of the procedure. The curette and the single tooth  tenaculum were removed. The speculum was removed. The patients perineum was cleansed of betadine and she was taken out of the dorsal lithotomy position.  Upon awakening the LMA was removed and the patient was transferred to the recovery room in stable and awake condition.  The sponge and instrument count were correct. There were no complications.   Addendum: After the endometrial curettage, the new mirnea intrauterine device was inserted in the standard, sterile fashion. The strings were cut to 3-4 cm.

## 2021-11-27 NOTE — Interval H&P Note (Signed)
History and Physical Interval Note:  11/27/2021 8:54 AM  Pamela Martinez  has presented today for surgery, with the diagnosis of Postmenopausal bleeding, thickend endometrium,.  The various methods of treatment have been discussed with the patient and family. After consideration of risks, benefits and other options for treatment, the patient has consented to  Procedure(s): DILATATION & CURETTAGE/HYSTEROSCOPY WITH MYOSURE (N/A) INTRAUTERINE DEVICE (IUD) INSERTION (N/A) INTRAUTERINE DEVICE (IUD) REMOVAL (N/A) as a surgical intervention.  The patient's history has been reviewed, patient examined, no change in status, stable for surgery.  I have reviewed the patient's chart and labs.  Questions were answered to the patient's satisfaction.     Salvadore Dom

## 2021-11-27 NOTE — Discharge Instructions (Addendum)
  Post Anesthesia Home Care Instructions  Activity: Get plenty of rest for the remainder of the day. A responsible individual must stay with you for 24 hours following the procedure.  For the next 24 hours, DO NOT: -Drive a car -Paediatric nurse -Drink alcoholic beverages -Take any medication unless instructed by your physician -Make any legal decisions or sign important papers.  Meals: Start with liquid foods such as gelatin or soup. Progress to regular foods as tolerated. Avoid greasy, spicy, heavy foods. If nausea and/or vomiting occur, drink only clear liquids until the nausea and/or vomiting subsides. Call your physician if vomiting continues.  Special Instructions/Symptoms: Your throat may feel dry or sore from the anesthesia or the breathing tube placed in your throat during surgery. If this causes discomfort, gargle with warm salt water. The discomfort should disappear within 24 hours.  DISCHARGE INSTRUCTIONS: HYSTEROSCOPY / ENDOMETRIAL ABLATION The following instructions have been prepared to help you care for yourself upon your return home.  May take Ibuprofen after 3:30 PM as needed for cramping/discomfort.  May take stool softner while taking narcotic pain medication to prevent constipation.  Drink plenty of water.  Personal hygiene:  Use sanitary pads for vaginal drainage, not tampons.  Shower the day after your procedure.  NO tub baths, pools or Jacuzzis for 2-3 weeks.  Wipe front to back after using the bathroom.  Activity and limitations:  Do NOT drive or operate any equipment for 24 hours. The effects of anesthesia are still present and drowsiness may result.  Do NOT rest in bed all day.  Walking is encouraged.  Walk up and down stairs slowly.  You may resume your normal activity in one to two days or as indicated by your physician. Sexual activity: NO intercourse for at least 2 weeks after the procedure, or as indicated by your Doctor.  Diet: Eat a light  meal as desired this evening. You may resume your usual diet tomorrow.  Return to Work: You may resume your work activities in one to two days or as indicated by Marine scientist.  What to expect after your surgery: Expect to have vaginal bleeding/discharge for 2-3 days and spotting for up to 10 days. It is not unusual to have soreness for up to 1-2 weeks. You may have a slight burning sensation when you urinate for the first day. Mild cramps may continue for a couple of days. You may have a regular period in 2-6 weeks.  Call your doctor for any of the following:  Excessive vaginal bleeding or clotting, saturating and changing one pad every hour.  Inability to urinate 6 hours after discharge from hospital.  Pain not relieved by pain medication.  Fever of 100.4 F or greater.  Unusual vaginal discharge or odor.  May take Tylenol beginning at 1PM as needed for cramping/discomfort.

## 2021-11-27 NOTE — Anesthesia Postprocedure Evaluation (Signed)
Anesthesia Post Note  Patient: Data processing manager  Procedure(s) Performed: DILATATION & CURETTAGE/HYSTEROSCOPY WITH MYOSURE INTRAUTERINE DEVICE (IUD) INSERTION INTRAUTERINE DEVICE (IUD) REMOVAL     Patient location during evaluation: PACU Anesthesia Type: General Level of consciousness: awake and alert Pain management: pain level controlled Vital Signs Assessment: post-procedure vital signs reviewed and stable Respiratory status: spontaneous breathing, nonlabored ventilation, respiratory function stable and patient connected to nasal cannula oxygen Cardiovascular status: blood pressure returned to baseline and stable Postop Assessment: no apparent nausea or vomiting Anesthetic complications: no   No notable events documented.  Last Vitals:  Vitals:   11/27/21 1000 11/27/21 1015  BP: 115/68 109/69  Pulse: (!) 55 (!) 50  Resp: 13 14  Temp:    SpO2: 97% 98%    Last Pain:  Vitals:   11/27/21 1015  TempSrc:   PainSc: 0-No pain                 Kimisha Eunice L Erline Siddoway

## 2021-11-28 ENCOUNTER — Encounter (HOSPITAL_BASED_OUTPATIENT_CLINIC_OR_DEPARTMENT_OTHER): Payer: Self-pay | Admitting: Obstetrics and Gynecology

## 2021-11-28 LAB — SURGICAL PATHOLOGY

## 2021-12-06 ENCOUNTER — Encounter: Payer: Self-pay | Admitting: Anesthesiology

## 2021-12-06 ENCOUNTER — Encounter: Payer: Self-pay | Admitting: Obstetrics and Gynecology

## 2021-12-06 ENCOUNTER — Ambulatory Visit (INDEPENDENT_AMBULATORY_CARE_PROVIDER_SITE_OTHER): Payer: BC Managed Care – PPO | Admitting: Obstetrics and Gynecology

## 2021-12-06 VITALS — BP 110/72 | HR 77 | Wt 193.0 lb

## 2021-12-06 DIAGNOSIS — Z9889 Other specified postprocedural states: Secondary | ICD-10-CM

## 2021-12-06 DIAGNOSIS — Z30431 Encounter for routine checking of intrauterine contraceptive device: Secondary | ICD-10-CM

## 2021-12-06 NOTE — Progress Notes (Signed)
GYNECOLOGY  VISIT   HPI: 51 y.o.   Married White or Caucasian Not Hispanic or Latino  female   [phone] with No LMP recorded. (Menstrual status: IUD).   here for post op visit following a hysteroscopy, D&C, polypectomy and mirena IUD insertion for abnormal uterine bleeding.  Pathology: A. ENDOMETRIUM, POLYPECTOMY:   -  Benign endometrial polyp, negative for hyperplasia and atypia.   B. 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  -  Fragments of unremarkable endocervical tissue.   She is still bleeding lightly, she could wear one pad all day.  She is also having some intermittent mild cramping, it will go away for a couple of days and then come back. Ibuprofen helps. No fevers.   GYNECOLOGIC HISTORY: No LMP recorded. (Menstrual status: IUD). 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   IUD (intrauterine device) in place 02/23/2016   Mild dysplasia of cervix 03/17/2012    Past Medical History:  Diagnosis Date   Asthma    per pt very mild,  last used rescue inhaler approx 2021   Ductal carcinoma in situ (DCIS) of left breast 10/2019   oncologist--- dr Lindi Adie;  dx 04/ 2021,  s/p bilateral lumpectomy 01-29-2020,  DCIS low grade, ER/PR+;   completed radiation 04-01-2020   GAD (generalized anxiety disorder)    History of COVID-19 02/2021   per pt mild symptoms that resolved   History of syncope 09/2019   pt had x2 episodes per nuerology note 09-30-2019 in epic, dr Leta Baptist, 09-16-2019,  negative work-up and event  monitor by cardiologist--- dr s. Domenic Polite , note 11-18-2019 in epic showed Sinus w/ rare PAC/ PVC;   (11-23-2021 pt stated has not had near syncope or syncope since )   Migraine    Personal history of radiation therapy    left breast 03-07-2020  to  04-01-2020   PONV (postoperative nausea and vomiting)    Restless leg syndrome    Wears glasses     Past Surgical History:  Procedure Laterality Date   ANTERIOR CRUCIATE LIGAMENT REPAIR Left 1990   BREAST EXCISIONAL BIOPSY     BREAST LUMPECTOMY WITH RADIOACTIVE SEED LOCALIZATION Bilateral 01/29/2020   Procedure: BILATERAL BREAST LUMPECTOMY WITH RADIOACTIVE SEED LOCALIZATION;  Surgeon: Jovita Kussmaul, MD;  Location: [facility];  Service: General;  Laterality: Bilateral;   COLONOSCOPY WITH PROPOFOL N/A 10/05/2021   Procedure: COLONOSCOPY WITH PROPOFOL;  Surgeon: Rogene Houston, MD;  Location: [facility];  Service: Endoscopy;  Laterality: N/A;  [place] N/A 11/27/2021   Procedure: DILATATION & CURETTAGE/HYSTEROSCOPY WITH MYOSURE;  Surgeon: Salvadore Dom, MD;  Location: [facility];  Service: Gynecology;  Laterality: N/A;   INTRAUTERINE DEVICE (IUD) INSERTION N/A  11/27/2021   Procedure: INTRAUTERINE DEVICE (IUD) INSERTION;  Surgeon: Salvadore Dom, MD;  Location[facility];  Service: Gynecology;  Laterality: N/A;   IUD REMOVAL N/A 11/27/2021   Procedure: INTRAUTERINE DEVICE (IUD) REMOVAL;  Surgeon: Salvadore Dom, MD;  Location: [facility];  Service: Gynecology;  Laterality: N/A;   KNEE ARTHROSCOPY Bilateral    x3   1980s   WISDOM TOOTH EXTRACTION      Current Outpatient Medications  Medication Sig Dispense Refill   albuterol (VENTOLIN HFA) 108 (90 Base) MCG/ACT inhaler Inhale 1-2 puffs into the lungs every 6 (six) hours as needed for wheezing or shortness of breath. 18 g 0   ALPRAZolam (XANAX) 0.5 MG tablet Take 0.5 mg by mouth at  bedtime as needed for anxiety.     fluticasone (FLONASE) 50 MCG/ACT nasal spray Place 1 spray into both nostrils daily as needed for allergies or rhinitis.     ibuprofen (ADVIL) 200 MG tablet Take 200 mg by mouth every 6 (six) hours as needed.     Lansoprazole (PREVACID PO) Take by mouth as needed.     loratadine (CLARITIN) 10 MG tablet Take 10 mg by mouth at bedtime.     magnesium oxide (MAG-OX) 400 MG tablet Take 400 mg by mouth daily.     Multiple Vitamin (MULTIVITAMIN ADULT PO) Take by mouth daily.     sertraline (ZOLOFT) 50 MG tablet Take 50 mg by mouth at bedtime.     tamoxifen (NOLVADEX) 10 MG tablet Take 0.5 tablets (5 mg total) by mouth 2 (two) times daily. (Patient taking differently: Take 5 mg by mouth at bedtime.) 60 tablet 3   Current Facility-Administered Medications  Medication Dose Route Frequency Provider Last Rate Last Admin   levonorgestrel (MIRENA) 20 MCG/24HR IUD   Intrauterine Once Fontaine, Belinda Block, MD         ALLERGIES: Diflucan [fluconazole] and Sulfa antibiotics  Family History  Problem Relation Age of Onset   COPD Father    CAD Father    Hyperlipidemia Brother    Hypertension Brother    Hyperlipidemia Brother    Osteoporosis Maternal Grandmother    Stroke Maternal Grandmother    Osteoporosis Paternal Grandmother    Cancer Paternal Grandfather        COLON   Cancer Paternal Aunt        PRIMARY PERITONEAL CANCER    Social History   Socioeconomic History   Marital status: Married    Spouse name: Tim   Number of children: 2   Years of education: Not on file   Highest education level: Associate degree: academic program  Occupational History    Comment: working on [employer]  Tobacco Use   Smoking status: Former    Years: 5.00    Types: Cigarettes    Quit date: 1990    Years since quitting: 33.4   Smokeless tobacco: Never  Vaping Use   Vaping Use: Never used  Substance and Sexual Activity   Alcohol use: Yes    Comment: Occasional   Drug use: Never    Sexual activity: Yes    Birth control/protection: I.U.D.    Comment: mirena  inserted 02-10-2016  Other Topics Concern   Not on file  Social History Narrative   Lives with husband    caffeine, tea x 1, soda x 1   Social Determinants of Health   Financial Resource Strain: Not on file  Food Insecurity: Not on file  Transportation Needs:  Not on file  Physical Activity: Not on file  Stress: Not on file  Social Connections: Not on file  Intimate Partner Violence: Not on file    Review of Systems  All other systems reviewed and are negative.  PHYSICAL EXAMINATION:    There were no vitals taken for this visit.    General appearance: alert, cooperative and appears stated age Abdomen: soft, non-tender; non distended, no masses,  no organomegaly  Pelvic: External genitalia:  no lesions              Urethra:  normal appearing urethra with no masses, tenderness or lesions              Bartholins and Skenes: normal                 Vagina: normal appearing vagina with normal color and discharge, no lesions              Cervix: no cervical motion tenderness, no lesions, and IUD strings 3 cm              Bimanual Exam:  Uterus:  normal size, contour, position, consistency, mobility, non-tender              Adnexa: no mass, fullness, tenderness               Chaperone was present for exam.  1. History of hysteroscopy Benign pathology Routine f/u  2. IUD check up Strings appropriate length Normal exam

## 2022-06-10 ENCOUNTER — Other Ambulatory Visit: Payer: Self-pay

## 2022-06-10 ENCOUNTER — Emergency Department (HOSPITAL_COMMUNITY): Payer: BC Managed Care – PPO

## 2022-06-10 ENCOUNTER — Emergency Department (HOSPITAL_COMMUNITY)
Admission: EM | Admit: 2022-06-10 | Discharge: 2022-06-10 | Disposition: A | Discharge status: Home / Self Care | Payer: BC Managed Care – PPO | Attending: Emergency Medicine | Admitting: Emergency Medicine

## 2022-06-10 DIAGNOSIS — R55 Syncope and collapse: Secondary | ICD-10-CM | POA: Diagnosis present

## 2022-06-10 DIAGNOSIS — R490 Dysphonia: Secondary | ICD-10-CM | POA: Diagnosis not present

## 2022-06-10 DIAGNOSIS — R059 Cough, unspecified: Secondary | ICD-10-CM | POA: Diagnosis not present

## 2022-06-10 DIAGNOSIS — Z79899 Other long term (current) drug therapy: Secondary | ICD-10-CM | POA: Diagnosis not present

## 2022-06-10 DIAGNOSIS — R630 Anorexia: Secondary | ICD-10-CM | POA: Diagnosis not present

## 2022-06-10 DIAGNOSIS — J45909 Unspecified asthma, uncomplicated: Secondary | ICD-10-CM | POA: Insufficient documentation

## 2022-06-10 DIAGNOSIS — Z1152 Encounter for screening for COVID-19: Secondary | ICD-10-CM | POA: Diagnosis not present

## 2022-06-10 DIAGNOSIS — Z853 Personal history of malignant neoplasm of breast: Secondary | ICD-10-CM | POA: Insufficient documentation

## 2022-06-10 LAB — URINALYSIS, ROUTINE W REFLEX MICROSCOPIC
Bilirubin Urine: NEGATIVE
Glucose, UA: NEGATIVE mg/dL
Ketones, ur: NEGATIVE mg/dL
Leukocytes,Ua: NEGATIVE
Nitrite: NEGATIVE
Protein, ur: NEGATIVE mg/dL
Specific Gravity, Urine: 1.025 (ref 1.005–1.030)
pH: 6 (ref 5.0–8.0)

## 2022-06-10 LAB — CBC
HCT: 44.3 % (ref 36.0–46.0)
Hemoglobin: 14.5 g/dL (ref 12.0–15.0)
MCH: 32.4 pg (ref 26.0–34.0)
MCHC: 32.7 g/dL (ref 30.0–36.0)
MCV: 98.9 fL (ref 80.0–100.0)
Platelets: 194 10*3/uL (ref 150–400)
RBC: 4.48 MIL/uL (ref 3.87–5.11)
RDW: 12.9 % (ref 11.5–15.5)
WBC: 6.4 10*3/uL (ref 4.0–10.5)
nRBC: 0 % (ref 0.0–0.2)

## 2022-06-10 LAB — RESP PANEL BY RT-PCR (FLU A&B, COVID) ARPGX2
Influenza A by PCR: NEGATIVE
Influenza B by PCR: NEGATIVE
SARS Coronavirus 2 by RT PCR: NEGATIVE

## 2022-06-10 LAB — I-STAT BETA HCG BLOOD, ED (MC, WL, AP ONLY): I-stat hCG, quantitative: <5 m[IU]/mL (ref <5)

## 2022-06-10 LAB — BASIC METABOLIC PANEL
Anion gap: 8 (ref 5–15)
BUN: 24 mg/dL — ABNORMAL HIGH (ref 6–20)
CO2: 24 mmol/L (ref 22–32)
Calcium: 9.4 mg/dL (ref 8.9–10.3)
Chloride: 110 mmol/L (ref 98–111)
Creatinine, Ser: 0.82 mg/dL (ref 0.44–1.00)
GFR, Estimated: >60 mL/min (ref >60)
Glucose, Bld: 119 mg/dL — ABNORMAL HIGH (ref 70–99)
Potassium: 3.8 mmol/L (ref 3.5–5.1)
Sodium: 142 mmol/L (ref 135–145)

## 2022-06-10 LAB — CBG MONITORING, ED: Glucose-Capillary: 118 mg/dL — ABNORMAL HIGH (ref 70–99)

## 2022-06-10 MED ORDER — BENZONATATE 100 MG PO CAPS: 100.0000 mg | ORAL_CAPSULE | Freq: Three times a day (TID) | ORAL | 0 refills | Status: DC

## 2022-06-10 MED ORDER — ACETAMINOPHEN 500 MG PO TABS: 500.0000 mg | ORAL_TABLET | Freq: Four times a day (QID) | ORAL | 0 refills | Status: DC | PRN

## 2022-06-10 NOTE — Discharge Instructions (Addendum)
You have been evaluated for your symptoms.  Fortunately your labs obtained today did not show any concerning finding.  Your passing out episode may be due to a condition called vasovagal syncope.  This should be short lasting and not cause any long-term effect however please follow-up with cardiology for further evaluation which may include Holter monitoring.  Take Tessalon as needed for cough, take Tylenol as needed for aches and pain.  Return if you have any concern.

## 2022-06-10 NOTE — ED Provider Notes (Signed)
[facility] DEPT Provider Note   CSN: [id number] Arrival date & time: 06/10/22  1302     History  Chief Complaint  Patient presents with   Loss of Consciousness    Pamela Martinez is a 51 y.o. female.  The history is provided by the patient and medical records. No language interpreter was used.  Loss of Consciousness    51 year old female significant history of migraine, remote history of breast cancer, history of asthma, history of syncope, presenting to the ED for evaluation of syncope.  Patient states for the past 4 to 5 days she has had cold symptoms.  She endorses having cough, generalized unwell, decrease in appetite and not eating and drinking much.  Today after taking a bath she was having a bowel movement but had a syncope episode which was witnessed by her partner.  She did not fall or hit her head.  At this time she denies any headache, neck pain, chest pain, trouble breathing, hemoptysis, abdominal pain, focal numbness or focal weakness or confusion.  Home Medications Prior to Admission medications   Medication Sig Start Date End Date Taking? Authorizing Provider  albuterol (VENTOLIN HFA) 108 (90 Base) MCG/ACT inhaler Inhale 1-2 puffs into the lungs every 6 (six) hours as needed for wheezing or shortness of breath. 04/13/20   Wurst, [place], PA-C  ALPRAZolam Duanne Moron) 0.5 MG tablet Take 0.5 mg by mouth at bedtime as needed for anxiety.    [provider]  fluticasone (FLONASE) 50 MCG/ACT nasal spray Place 1 spray into both nostrils daily as needed for allergies or rhinitis.    [provider]  ibuprofen (ADVIL) 200 MG tablet Take 200 mg by mouth every 6 (six) hours as needed.    [provider]  Lansoprazole (PREVACID PO) Take by mouth as needed.    [provider]  loratadine (CLARITIN) 10 MG tablet Take 10 mg by mouth at bedtime.    [provider]  magnesium oxide (MAG-OX) 400 MG tablet Take  400 mg by mouth daily.    [provider]  Multiple Vitamin (MULTIVITAMIN ADULT PO) Take by mouth daily.    [provider]  sertraline (ZOLOFT) 50 MG tablet Take 50 mg by mouth at bedtime. 09/17/19   [provider]  tamoxifen (NOLVADEX) 10 MG tablet Take 0.5 tablets (5 mg total) by mouth 2 (two) times daily. Patient taking differently: Take 5 mg by mouth at bedtime. 07/19/21   Nicholas Lose, MD  levonorgestrel (MIRENA) 20 MCG/24HR IUD 1 each by Intrauterine route once. INSERTED 05-01-06.   09/02/17  [provider]      Allergies    Diflucan [fluconazole] and Sulfa antibiotics    Review of Systems   Review of Systems  Cardiovascular:  Positive for syncope.  All other systems reviewed and are negative.   Physical Exam Updated Vital Signs BP 103/79 (BP Location: Left Arm)   Pulse 87   Temp 98.3 F (36.8 C) (Oral)   Resp 17   Ht '5\' 4"'$  (1.626 m)   Wt 88 kg   LMP 05/13/2022 (Approximate)   SpO2 98%   BMI 33.30 kg/m  Physical Exam Vitals and nursing note reviewed.  Constitutional:      General: She is not in acute distress.    Appearance: She is well-developed.     Comments: Hoarseness but overall well-appearing  HENT:     Head: Normocephalic and atraumatic.  Eyes:     Conjunctiva/sclera: Conjunctivae normal.  Cardiovascular:     Rate and Rhythm: Normal rate and regular rhythm.     Pulses: Normal pulses.     Heart sounds: Normal heart sounds.  Pulmonary:     Effort: Pulmonary effort is normal.  Abdominal:     Palpations: Abdomen is soft.     Tenderness: There is no abdominal tenderness.  Musculoskeletal:     Cervical back: Neck supple.  Skin:    Findings: No rash.  Neurological:     Mental Status: She is alert and oriented to person, place, and time.     GCS: GCS eye subscore is 4. GCS verbal subscore is 5. GCS motor subscore is 6.  Psychiatric:        Mood and Affect: Mood normal.     ED Results / Procedures / Treatments    Labs (all labs ordered are listed, but only abnormal results are displayed) Labs Reviewed  BASIC METABOLIC PANEL - Abnormal; Notable for the following components:      Result Value   Glucose, Bld 119 (*)    BUN 24 (*)    All other components within normal limits  CBG MONITORING, ED - Abnormal; Notable for the following components:   Glucose-Capillary 118 (*)    All other components within normal limits  RESP PANEL BY RT-PCR (FLU A&B, COVID) ARPGX2  CBC  URINALYSIS, ROUTINE W REFLEX MICROSCOPIC  I-STAT BETA HCG BLOOD, ED ([facility], [facility], [facility] ONLY)    EKG None ED ECG REPORT   Date: 06/10/2022  Rate: 90  Rhythm: normal sinus rhythm  QRS Axis: right  Intervals: normal  ST/T Wave abnormalities: normal  Conduction Disutrbances:none  Narrative Interpretation:   Old EKG Reviewed: unchanged  I have personally reviewed the EKG tracing and agree with the computerized printout as noted.   Radiology DG Chest 2 View  Result Date: 06/10/2022 CLINICAL DATA:  Cough.  Syncope. EXAM: CHEST - 2 VIEW COMPARISON:  April 14, 2020 FINDINGS: The heart size and mediastinal contours are within normal limits. Both lungs are clear. The visualized skeletal structures are unremarkable. IMPRESSION: No active cardiopulmonary disease. Electronically Signed   By: Dorise Bullion III M.D.   On: 06/10/2022 15:16    Procedures Procedures    Medications Ordered in ED Medications - No data to display  ED Course/ Medical Decision Making/ A&P                           Medical Decision Making Amount and/or Complexity of Data Reviewed Labs: ordered. Radiology: ordered.   BP 120/81   Pulse 81   Temp 98.3 F (36.8 C) (Oral)   Resp 20   Ht '5\' 4"'$  (1.626 m)   Wt 88 kg   LMP 05/13/2022 (Approximate)   SpO2 94%   BMI 33.30 kg/m   79:81 PM  51 year old female significant history of migraine, remote history of breast cancer, history of asthma, history of syncope, presenting to the ED for evaluation of  syncope.  Patient states for the past 4 to 5 days she has had cold symptoms.  She endorses having cough, generalized unwell, decrease in appetite and not eating and drinking much.  Today after taking a bath she was having a bowel movement but had a syncope episode which was witnessed by her partner.  She did not fall or hit her head.  At this time she denies any headache, neck pain, chest pain, trouble breathing, hemoptysis, abdominal pain, focal numbness or  focal weakness or confusion.  On exam, patient is resting comfortably appears to be in no acute distress.  She does have hoarseness and occasional nonproductive cough.  On exam unremarkable abdomen is soft nontender, she has no focal neurodeficit.  She does not have any signs of trauma.  Vital signs are reassuring, no hypoxia, she is afebrile, normal blood pressure and normal heart rate.  -Labs ordered, independently viewed and interpreted by me.  Labs remarkable for reassuring labs without electrolytes derangement or anemia -The patient was maintained on a cardiac monitor.  I personally viewed and interpreted the cardiac monitored which showed an underlying rhythm of: NSR -Imaging independently viewed and interpreted by me and I agree with radiologist's interpretation.  Result remarkable for CXR without focal infiltrates -This patient presents to the ED for concern of syncope, this involves an extensive number of treatment options, and is a complaint that carries with it a high risk of complications and morbidity.  The differential diagnosis includes dehydration, vasovagal syncope, cardiac arrhythmia, PE, ACS, stroke -Co morbidities that complicate the patient evaluation includes asthma, breast CA -Treatment includes none -Reevaluation of the patient after these medicines showed that the patient improved -PCP office notes or outside notes reviewed -Escalation to admission/observation considered: patients feels much better, is comfortable with  discharge, and will follow up with PCP -Prescription medication considered, patient comfortable with tessalon and tylenol -Social Determinant of Health considered   6:46 PM Suspect patient syncope is likely vasovagal as she was bearing down to have a bowel movement when she passed out.  I have low suspicion for PE causing her syncope as patient denies having shortness of breath, hemoptysis, dyspnea on exertion.  Patient may benefit from outpatient follow-up with cardiology for further workup of her syncope which may include Holter monitoring.  I also offer patient IV fluid here but patient declined.  She feels comfortable going home.  She understand to return if symptoms worsen.  Overall labs are reassuring.         Final Clinical Impression(s) / ED Diagnoses Final diagnoses:  Vasovagal syncope    Rx / DC Orders ED Discharge Orders          Ordered    Ambulatory referral to Cardiology       Comments: If you have not heard from the Cardiology office within the next 72 hours please call [phone].   06/10/22 1850    benzonatate (TESSALON) 100 MG capsule  Every 8 hours        06/10/22 1850    acetaminophen (TYLENOL) 500 MG tablet  Every 6 hours PRN        06/10/22 1850              Domenic Moras, PA-C 06/10/22 1850    Milton Ferguson, MD 06/11/22 1050

## 2022-06-10 NOTE — ED Triage Notes (Signed)
Triage provided by visitor, reports illness for the past 5 days w/ cough and feeling unwell, had a bath this morning, was feeling weak, had BM and passed out, took about 5 min to re-orient. Reports she felt warm. Did not hit head. IUD in place, reports active menstrual bleeding.

## 2022-06-10 NOTE — ED Provider Triage Note (Signed)
Emergency Medicine Provider Triage Evaluation Note  Pamela Martinez , a 51 y.o. female  was evaluated in triage.  Pt complains of syncope. Report cold sxs for the past few days, with cough, congestion and fatigue.  Had a syncope episode while having a BM today.  Feels week. No n/v/d or sob  Review of Systems  Positive: As above Negative: As above  Physical Exam  BP 103/79 (BP Location: Left Arm)   Pulse 87   Temp 98.3 F (36.8 C) (Oral)   Resp 17   Ht '5\' 4"'$  (1.626 m)   Wt 88 kg   SpO2 98%   BMI 33.30 kg/m  Gen:   Awake, no distress   Resp:  Normal effort  MSK:   Moves extremities without difficulty  Other:    Medical Decision Making  Medically screening exam initiated at 1:52 PM.  Appropriate orders placed.  Jessye Rudd Music therapist was informed that the remainder of the evaluation will be completed by another provider, this initial triage assessment does not replace that evaluation, and the importance of remaining in the ED until their evaluation is complete.     Domenic Moras, PA-C 06/10/22 1354

## 2022-06-20 NOTE — Progress Notes (Unsigned)
Cardiology Office Note:    Date:  06/21/2022   ID:  Pamela Martinez, DOB 23-Jan-1971, MRN [id number]  PCP:  Celene Squibb, MD   [facility] Cardiologist:  Rozann Lesches, MD     Referring MD: Celene Squibb, MD   CC: Here for syncope evaluation  History of Present Illness:    Pamela Martinez is a 51 y.o. female with a hx of the following:  Hx of recurrent syncope Hx of breast cancer GAD Asthma  Pt is a very pleasant 51 y.o. female with past medical history as mentioned above.  First evaluated by Dr. Domenic Polite in 2021. She was referred to cardiology for evaluation of syncope.  This initial episode happened in March 2021 when she was walking in the kitchen and was aware that she was falling, denied any palpitations at that time. Had another similar episode at night when she got up, became lightheaded, and symptoms resolved.  States she has had episodes of syncope since childhood.  Monitor revealed rare ventricular and atrial ectopy, no sustained pauses or arrhythmias noted.   More recently, she presented to [facility] long ED on June 10, 2022 with chief complaint of LOC.  She had admitted to 4 to 5 days of cold symptoms, poor appetite and oral hydration.  Hospital documentation stated she had a syncopal episode that was witnessed by her partner while having a bowel movement.  Denied falling or hitting her head.  Labs were unremarkable.  Normal sinus rhythm on cardiac monitor.  Chest x-ray unremarkable.  Was told to follow-up with cardiology due to concern for vasovagal syncope. D/C in stable condition.   Today she presents for follow-up.  She states she did not have vasovagal syncope.  She stated that she got out of the shower felt funny, and sat down on the lid of her toilet.  Stated she had 1 other recurrence this past Tuesday where she stepped out of the shower, felt hot, and sat down on the lid of her toilet, and she said husband heard her fall, did not go to  the ED, stated she has some bruising on her head and tender in some spots.  The week before Thanksgiving she stated she was at work and was walking around lunchtime, states she did not feel good at that time, and sat down as she felt close to passing out.  Does admit to shortness of breath, has had URI symptoms for the past 3 weeks.  Does admit to occasional cramping in her chest, states it is "nothing major."  She says ever since she has had breast cancer she is hyperalert to any chest symptoms she has.  States it is occasional in occurrence, occurs for only few minutes, nothing makes it worse.  Not associated with exertion.  Occurs at rest and stretching her left arm helps.  Denies any palpitations, orthopnea, PND, swelling, significant weight changes, acute bleeding, or claudication.  Denies any other questions or concerns today.  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 Domenic Polite , note 11-18-2019 in epic showed Sinus w/ rare PAC/ PVC;   (11-23-2021 pt stated has not had near syncope or syncope since )   Migraine    Personal history of radiation therapy    left breast 03-07-2020  to  04-01-2020   PONV (postoperative nausea and vomiting)    Restless leg syndrome    Wears glasses     Past Surgical History:  Procedure Laterality Date   ANTERIOR CRUCIATE LIGAMENT REPAIR Left 1990   BREAST EXCISIONAL BIOPSY     BREAST LUMPECTOMY WITH RADIOACTIVE SEED LOCALIZATION  Bilateral 01/29/2020   Procedure: BILATERAL BREAST LUMPECTOMY WITH RADIOACTIVE SEED LOCALIZATION;  Surgeon: Jovita Kussmaul, MD;  Location: [facility];  Service: General;  Laterality: Bilateral;   COLONOSCOPY WITH PROPOFOL N/A 10/05/2021   Procedure: COLONOSCOPY WITH PROPOFOL;  Surgeon: Rogene Houston, MD;  [facility];  Service: Endoscopy;  Laterality: N/A;  [place] N/A 11/27/2021   Procedure: DILATATION & CURETTAGE/HYSTEROSCOPY WITH MYOSURE;  Surgeon: Salvadore Dom, MD;  Location: [facility];  Service: Gynecology;  Laterality: N/A;   INTRAUTERINE DEVICE (IUD) INSERTION N/A 11/27/2021   Procedure: INTRAUTERINE DEVICE (IUD) INSERTION;  Surgeon: Salvadore Dom, MD;  Location: [facility];  Service: Gynecology;  Laterality: N/A;   IUD REMOVAL N/A 11/27/2021   Procedure: INTRAUTERINE DEVICE (IUD) REMOVAL;  Surgeon: Salvadore Dom, MD;  Location: [facility];  Service: Gynecology;  Laterality: N/A;   KNEE ARTHROSCOPY Bilateral    x3   1980s   WISDOM TOOTH EXTRACTION      Current Medications: Current Meds  Medication Sig   ALPRAZolam (XANAX) 0.5 MG tablet Take 0.5 mg by mouth at bedtime as needed for anxiety.   fluticasone (FLONASE) 50 MCG/ACT nasal spray Place 1 spray into both nostrils daily as needed for allergies or rhinitis.   ibuprofen (ADVIL) 200 MG tablet Take 200 mg by mouth every 6 (six) hours as needed.   Lansoprazole (PREVACID PO) Take by mouth as needed.   loratadine (CLARITIN) 10 MG tablet Take 10 mg by mouth at bedtime.   Multiple Vitamin (MULTIVITAMIN ADULT PO) Take by mouth daily.   sertraline (ZOLOFT) 50 MG tablet Take 50 mg by mouth at bedtime.   tamoxifen (NOLVADEX) 10 MG tablet Take 0.5 tablets (5 mg total) by mouth 2 (two) times daily. (Patient taking differently: Take 5 mg by mouth at bedtime.)   tamoxifen (NOLVADEX) 10 MG tablet Take 10 mg by mouth every  other day.    Allergies:   Diflucan [fluconazole] and Sulfa antibiotics   Social History   Socioeconomic History   Marital status: Married    Spouse name: Tim   Number of children: 2   Years of education: Not on file   Highest education level: Associate degree: academic program  Occupational History    Comment: working on [employer]  Tobacco Use   Smoking status: Former    Years: 5.00    Types: Cigarettes    Quit date: 1990    Years since quitting: 33.9   Smokeless tobacco: Never  Vaping Use   Vaping Use: Never used  Substance and Sexual Activity   Alcohol use: Yes    Comment: Occasional   Drug use: Never   Sexual activity: Yes    Birth control/protection:  I.U.D.    Comment: mirena  inserted 02-10-2016  Other Topics Concern   Not on file  Social History Narrative   Lives with husband    caffeine, tea x 1, soda x 1   Social Determinants of Health   Financial Resource Strain: Not on file  Food Insecurity: Not on file  Transportation Needs: Not on file  Physical Activity: Not on file  Stress: Not on file  Social Connections: Not on file     Family History: The patient's family history includes CAD in her father; COPD in her father; Cancer in her paternal aunt and paternal grandfather; Hyperlipidemia in her brother and brother; Hypertension in her brother; Osteoporosis in her maternal grandmother and paternal grandmother; Stroke in her maternal grandmother.  ROS:   Review of Systems  Constitutional: Negative.   HENT:  Positive for congestion. Negative for ear discharge, ear pain, hearing loss, nosebleeds, sinus pain, sore throat and tinnitus.        See HPI.  Eyes: Negative.   Respiratory:  Positive for shortness of breath. Negative for cough, hemoptysis, sputum production, wheezing and stridor.        See HPI.  Cardiovascular:  Positive for chest pain. Negative for palpitations, orthopnea, claudication, leg swelling and PND.       See HPI.  Gastrointestinal: Negative.    Genitourinary:  Positive for flank pain. Negative for dysuria, frequency, hematuria and urgency.       Does admit to lower back pain cramping x 4 days, has had kidney stones before, is wondering this is a kidney stone.  Denies any fever, chills, nausea or vomiting.  Musculoskeletal:  Positive for back pain and falls. Negative for joint pain, myalgias and neck pain.       Does admit to lower back pain, wondering if this is a kidney stone.  Skin: Negative.   Neurological:  Positive for loss of consciousness. Negative for dizziness, tingling, tremors, sensory change, speech change, focal weakness, seizures, weakness and headaches.  Endo/Heme/Allergies:  Positive for polydipsia. Negative for environmental allergies. Does not bruise/bleed easily.  Psychiatric/Behavioral: Negative.      Please see the history of present illness.    All other systems reviewed and are negative.  EKGs/Labs/Other Studies Reviewed:    The following studies were reviewed today:   EKG:  EKG is not ordered today.  EKG dated 06/10/2022 reviewed and demonstrated normal sinus rhythm without any acute ischemic changes.  7-day cardiac monitor on December 08, 2019: Predominant rhythm is sinus with heart rate ranging from 44 bpm up to 136 bpm and average heart rate 70 bpm. Rare PACs and PVCs were noted representing less than 1% of total beats. There were no sustained arrhythmias or pauses.  Recent Labs: 06/10/2022: BUN 24; Creatinine, Ser 0.82; Hemoglobin 14.5; Platelets 194; Potassium 3.8; Sodium 142  Recent Lipid Panel    Component Value Date/Time   CHOL 193 09/22/2020 1422   TRIG 133 09/22/2020 1422   HDL 43 (L) 09/22/2020 1422   CHOLHDL 4.5 09/22/2020 1422   VLDL 20 08/24/2016 1527   LDLCALC 125 (H) 09/22/2020 1422     Risk Assessment/Calculations:         The 10-year ASCVD risk score (Arnett DK, et al., 2019) is: 2%   Values used to calculate the score:     Age: 36 years     Sex: Female     Is Non-Hispanic  African American: No     Diabetic: No  Tobacco smoker: No     Systolic Blood Pressure: 916 mmHg     Is BP treated: No     HDL Cholesterol: 43 mg/dL     Total Cholesterol: 193 mg/dL       Physical Exam:    VS:  BP 127/87 (BP Location: Right Arm, Cuff Size: Normal)   Pulse 72   Ht '5\' 4"'$  (1.626 m)   Wt 182 lb 3.2 oz (82.6 kg)   LMP 05/13/2022 (Approximate)   SpO2 97%   BMI 31.27 kg/m     Wt Readings from Last 3 Encounters:  06/21/22 182 lb 3.2 oz (82.6 kg)  06/10/22 194 lb 0.1 oz (88 kg)  12/06/21 193 lb (87.5 kg)     Orthostatic vital signs today normal, unremarkable.  GEN: Well nourished, well developed 51 year old female in no acute distress NECK: No JVD; No carotid bruits CARDIAC: S1/S2, RRR, no murmurs, rubs, gallops; 2+ peripheral pulses throughout, strong and equal bilaterally RESPIRATORY:  Clear and diminished to auscultation without rales, wheezing or rhonchi  MUSCULOSKELETAL:  No edema; No deformity, no tenderness to palpation along lower back, no pain to percussion along CVA. SKIN: Warm and dry NEUROLOGIC:  Alert and oriented x 3 PSYCHIATRIC:  Normal,  affect   ASSESSMENT:    1. Syncope, unspecified syncope type   2. Medication management   3. Chest pain of uncertain etiology   4. Shortness of breath   5. History of recent fall   6. Acute midline low back pain, unspecified whether sciatica present   7. Polydipsia    PLAN:    In order of problems listed above:  History of recurrent syncope Etiology unclear.  It was felt that her past history of syncope was possibly vasovagal in nature during past ED visit, however patient denies.  Dr. Domenic Polite who saw her in 2021 was felt that it might be neurocardiogenic in nature.  7-day monitor arranged with Dr. Domenic Polite was overall unremarkable.  Will arrange 14-day live ZIO monitor and obtain magnesium level.  Overall labs in ED were overall unremarkable.  Orthostatics today negative. Blood sugars were fine in ED  and A1c obtained last July was normal.  Will also obtain carotid Dopplers and arrange echocardiogram.  Have provided her a work note and discussed to avoid driving for at least 6 months.  She verbalized understanding.  Discussed conservative measures including staying adequately hydrated, avoiding excessive exertion, and changing positions slowly. Heart healthy diet recommended.  May need to discuss midodrine/Florinef at next follow-up visit if symptoms do not improve.  2.  Chest pain of uncertain etiology, shortness of breath Atypical in nature.  Does appear to sound MSK related due to the fact that stretching helps.  Not bothersome per her report.  Continue to monitor at this time.  Recommended Tylenol 1000 mg twice daily.  ED precautions discussed.  If worsening symptoms by next follow-up visit, plan to discuss ischemic evaluation.  Continue current medication regimen. Heart healthy diet recommended.  Does admit to occasional shortness of breath, her symptoms could very well be related to recent URI, continue to follow-up with PCP.  Recent fall Associated with syncopal event.  States she did hit her head, denies any neurological deficits or acute injuries. Continue to follow with PCP.  ED precautions discussed.  Continue current medication regimen.  Low back pain Etiology uncertain.  Denies any fever, chills, recent sick contacts.  Could very well be a kidney stone.  No tenderness to palpation or percussion on  exam.  I recommended she follow-up with her PCP and mention this too may need to arrange CT scan of abdomen and pelvis to rule out kidney stone.  Recommended to stay adequately hydrated and to drink lemon water. Heart healthy diet and regular cardiovascular exercise encouraged.    Polydipsia Etiology multifactorial.  Could be related to possible dehydration.  A1c checked back in July was normal.  She is not a diabetic.  TSH normal earlier this year. Continue to follow with PCP.    Disposition: Follow-up with me in 6 weeks or sooner if anything changes.    Medication Adjustments/Labs and Tests Ordered: Current medicines are reviewed at length with the patient today.  Concerns regarding medicines are outlined above.  Orders Placed This Encounter  Procedures   Magnesium   ECHOCARDIOGRAM COMPLETE   VAS US CAROTID   No orders of the defined types were placed in this encounter.   Patient Instructions  Medication Instructions:  Continue all current medications.  Labwork: Magnesium - order given today   Testing/Procedures: Your physician has requested that you have an echocardiogram. Echocardiography is a painless test that uses sound waves to create images of your heart. It provides your doctor with information about the size and shape of your heart and how well your heart's chambers and valves are working. This procedure takes approximately one hour. There are no restrictions for this procedure. Please do NOT wear cologne, perfume, aftershave, or lotions (deodorant is allowed). Please arrive 15 minutes prior to your appointment time. Your physician has requested that you have a carotid duplex. This test is an ultrasound of the carotid arteries in your neck. It looks at blood flow through these arteries that supply the brain with blood. Allow one hour for this exam. There are no restrictions or special instructions.  Your physician has recommended that you wear an event monitor. Event monitors are medical devices that record the heart's electrical activity. Doctors most often [place] these monitors to diagnose arrhythmias. Arrhythmias are problems with the speed or rhythm of the heartbeat. The monitor is a small, portable device. You can wear one while you do your normal daily activities. This is usually used to diagnose what is causing palpitations/syncope (passing out).   Follow-Up:  Office will contact with results via phone, letter or mychart.   6 weeks   Any Other  Special Instructions Will Be Listed Below (If Applicable).   If you need a refill on your cardiac medications before your next appointment, please call your pharmacy.    SignedFinis Bud, NP  06/21/2022 12:32 PM    [place]

## 2022-06-21 ENCOUNTER — Ambulatory Visit: Payer: BC Managed Care – PPO | Attending: Student | Admitting: Nurse Practitioner

## 2022-06-21 ENCOUNTER — Encounter: Payer: Self-pay | Admitting: Nurse Practitioner

## 2022-06-21 ENCOUNTER — Other Ambulatory Visit (INDEPENDENT_AMBULATORY_CARE_PROVIDER_SITE_OTHER): Payer: BC Managed Care – PPO

## 2022-06-21 ENCOUNTER — Other Ambulatory Visit: Payer: Self-pay | Admitting: Nurse Practitioner

## 2022-06-21 VITALS — BP 127/87 | HR 72 | Ht 64.0 in | Wt 182.2 lb

## 2022-06-21 DIAGNOSIS — R079 Chest pain, unspecified: Secondary | ICD-10-CM | POA: Diagnosis not present

## 2022-06-21 DIAGNOSIS — R0602 Shortness of breath: Secondary | ICD-10-CM | POA: Diagnosis not present

## 2022-06-21 DIAGNOSIS — R55 Syncope and collapse: Secondary | ICD-10-CM | POA: Diagnosis not present

## 2022-06-21 DIAGNOSIS — Z79899 Other long term (current) drug therapy: Secondary | ICD-10-CM

## 2022-06-21 DIAGNOSIS — R631 Polydipsia: Secondary | ICD-10-CM

## 2022-06-21 DIAGNOSIS — M545 Low back pain, unspecified: Secondary | ICD-10-CM

## 2022-06-21 DIAGNOSIS — Z9181 History of falling: Secondary | ICD-10-CM

## 2022-06-21 NOTE — Patient Instructions (Signed)
Medication Instructions:  Continue all current medications.  Labwork: Magnesium - order given today   Testing/Procedures: Your physician has requested that you have an echocardiogram. Echocardiography is a painless test that uses sound waves to create images of your heart. It provides your doctor with information about the size and shape of your heart and how well your heart's chambers and valves are working. This procedure takes approximately one hour. There are no restrictions for this procedure. Please do NOT wear cologne, perfume, aftershave, or lotions (deodorant is allowed). Please arrive 15 minutes prior to your appointment time. Your physician has requested that you have a carotid duplex. This test is an ultrasound of the carotid arteries in your neck. It looks at blood flow through these arteries that supply the brain with blood. Allow one hour for this exam. There are no restrictions or special instructions.  Your physician has recommended that you wear an event monitor. Event monitors are medical devices that record the heart's electrical activity. Doctors most often [place] these monitors to diagnose arrhythmias. Arrhythmias are problems with the speed or rhythm of the heartbeat. The monitor is a small, portable device. You can wear one while you do your normal daily activities. This is usually used to diagnose what is causing palpitations/syncope (passing out).   Follow-Up:  Office will contact with results via phone, letter or mychart.   6 weeks   Any Other Special Instructions Will Be Listed Below (If Applicable).   If you need a refill on your cardiac medications before your next appointment, please call your pharmacy.

## 2022-06-22 ENCOUNTER — Ambulatory Visit: Payer: BC Managed Care – PPO | Admitting: Student

## 2022-06-25 DIAGNOSIS — R55 Syncope and collapse: Secondary | ICD-10-CM | POA: Diagnosis not present

## 2022-06-30 LAB — MAGNESIUM: Magnesium: 2.3 mg/dL (ref 1.6–2.3)

## 2022-07-11 ENCOUNTER — Ambulatory Visit (INDEPENDENT_AMBULATORY_CARE_PROVIDER_SITE_OTHER): Payer: BC Managed Care – PPO

## 2022-07-11 ENCOUNTER — Ambulatory Visit: Payer: BC Managed Care – PPO | Attending: Nurse Practitioner

## 2022-07-11 DIAGNOSIS — R55 Syncope and collapse: Secondary | ICD-10-CM

## 2022-07-11 LAB — ECHOCARDIOGRAM COMPLETE
AR max vel: 2.48 cm2
AV Peak grad: 7.1 mmHg
Ao pk vel: 1.33 m/s
Area-P 1/2: 2.74 cm2
Calc EF: 68.1 %
MV M vel: 4.68 m/s
MV Peak grad: 87.6 mmHg
S' Lateral: 2.6 cm
Single Plane A2C EF: 67.1 %
Single Plane A4C EF: 70.9 %

## 2022-07-11 NOTE — Progress Notes (Incomplete)
Patient Care Team: Celene Squibb, MD as PCP - General (Internal Medicine) Satira Sark, MD as PCP - Cardiology (Cardiology) Jovita Kussmaul, MD as Surgeon (General Surgery) Nicholas Lose, MD as Medical Oncologist (Hematology and Oncology) Eppie Gibson, MD as Radiation Oncologist (Radiation Oncology)  DIAGNOSIS: No diagnosis found.  SUMMARY OF ONCOLOGIC HISTORY: Oncology History  Ductal carcinoma in situ (DCIS) of left breast  11/04/2019 Initial Diagnosis   Screening mammogram on 10/12/19 detected a possible left breast mass. Diagnostic mammogram and [place] on 10/27/19 showed a 0.6cm mass at the 8:30 position in the left breast. Biopsy on 11/04/19 showed ductal carcinoma in situ with calcifications partially involving an intraductal papilloma, low grade, ER/PR+ 100%.    01/29/2020 Surgery   Bilateral lumpectomies [name]) [phone]): left breast: low grade DCIS involving papilloma, clear margins; right breast: no evidence of carcinoma. No regional lymph nodes were examined.   02/01/2020 Cancer Staging   Staging form: Breast, AJCC 8th Edition - Pathologic stage from 02/01/2020: Stage 0 (pTis (DCIS), pN0, cM0, ER+, PR+)   03/07/2020 - 04/01/2020 Radiation Therapy   The patient initially received a dose of 42.56 Gy in 16 fractions to the breast using whole-breast tangent fields. This was delivered using a 3-D conformal technique. The total dose was 42.56 Gy.   06/2020 - 06/2025 Anti-estrogen oral therapy   Tamoxifen     CHIEF COMPLIANT: Follow-up left breast cancer on Tamoxifen  INTERVAL HISTORY: Pamela Martinez is a 52 y.o. with above-mentioned history of left breast DCIS who underwent bilateral lumpectomies, radiation, and is currently on antiestrogen therapy with tamoxifen. She presents to the clinic today for follow-up.    ALLERGIES:  is allergic to diflucan [fluconazole] and sulfa antibiotics.  MEDICATIONS:  Current Outpatient Medications  Medication Sig Dispense Refill    ALPRAZolam (XANAX) 0.5 MG tablet Take 0.5 mg by mouth at bedtime as needed for anxiety.     fluticasone (FLONASE) 50 MCG/ACT nasal spray Place 1 spray into both nostrils daily as needed for allergies or rhinitis.     ibuprofen (ADVIL) 200 MG tablet Take 200 mg by mouth every 6 (six) hours as needed.     Lansoprazole (PREVACID PO) Take by mouth as needed.     loratadine (CLARITIN) 10 MG tablet Take 10 mg by mouth at bedtime.     Multiple Vitamin (MULTIVITAMIN ADULT PO) Take by mouth daily.     sertraline (ZOLOFT) 50 MG tablet Take 50 mg by mouth at bedtime.     tamoxifen (NOLVADEX) 10 MG tablet Take 10 mg by mouth every other day.     Current Facility-Administered Medications  Medication Dose Route Frequency Provider Last Rate Last Admin   levonorgestrel (MIRENA) 20 MCG/24HR IUD   Intrauterine Once Fontaine, Belinda Block, MD        PHYSICAL EXAMINATION: ECOG PERFORMANCE STATUS: {CHL ONC ECOG PN:[id number]}  There were no vitals filed for this visit. There were no vitals filed for this visit.  BREAST:*** No palpable masses or nodules in either right or left breasts. No palpable axillary supraclavicular or infraclavicular adenopathy no breast tenderness or nipple discharge. (exam performed in the presence of a chaperone)  LABORATORY DATA:  I have reviewed the data as listed    Latest Ref Rng & Units 06/10/2022    2:26 PM 09/22/2020    2:22 PM 04/14/2020    2:43 PM  CMP  Glucose 70 - 99 mg/dL 119  79  94   BUN 6 - 20 mg/dL 24  19  12   Creatinine 0.44 - 1.00 mg/dL 0.82  0.72  0.66   Sodium 135 - 145 mmol/L 142  140  137   Potassium 3.5 - 5.1 mmol/L 3.8  3.9  3.6   Chloride 98 - 111 mmol/L 110  104  106   CO2 22 - 32 mmol/L '24  26  23   '$ Calcium 8.9 - 10.3 mg/dL 9.4  9.4  9.4   Total Protein 6.1 - 8.1 g/dL  6.7    Total Bilirubin 0.2 - 1.2 mg/dL  0.7    AST 10 - 35 U/L  16    ALT 6 - 29 U/L  24      Lab Results  Component Value Date   WBC 6.4 06/10/2022   HGB 14.5 06/10/2022   HCT  44.3 06/10/2022   MCV 98.9 06/10/2022   PLT 194 06/10/2022   NEUTROABS 3,913 09/22/2020    ASSESSMENT & PLAN:  No problem-specific Assessment & Plan notes found for this encounter.    No orders of the defined types were placed in this encounter.  The patient has a good understanding of the overall plan. she agrees with it. she will call with any problems that may develop before the next visit here. Total time spent: 30 mins including face to face time and time spent for planning, charting and co-ordination of care   Suzzette Righter, [place] 07/11/22    I Gardiner Coins am acting as a Education administrator for [employer]  ***

## 2022-07-13 ENCOUNTER — Telehealth: Payer: Self-pay | Admitting: Hematology and Oncology

## 2022-07-13 NOTE — Telephone Encounter (Signed)
Rescheduled appointments per room/resource. Left voicemail.

## 2022-07-19 ENCOUNTER — Ambulatory Visit: Payer: BC Managed Care – PPO | Admitting: Hematology and Oncology

## 2022-07-20 ENCOUNTER — Other Ambulatory Visit: Payer: Self-pay

## 2022-07-20 ENCOUNTER — Encounter: Payer: Self-pay | Admitting: *Deleted

## 2022-07-20 ENCOUNTER — Inpatient Hospital Stay: Payer: BC Managed Care – PPO | Attending: Hematology and Oncology | Admitting: Hematology and Oncology

## 2022-07-20 VITALS — BP 125/80 | HR 68 | Temp 97.0°F | Resp 18 | Wt 189.6 lb

## 2022-07-20 DIAGNOSIS — Z79810 Long term (current) use of selective estrogen receptor modulators (SERMs): Secondary | ICD-10-CM | POA: Insufficient documentation

## 2022-07-20 DIAGNOSIS — Z923 Personal history of irradiation: Secondary | ICD-10-CM | POA: Insufficient documentation

## 2022-07-20 DIAGNOSIS — D0512 Intraductal carcinoma in situ of left breast: Secondary | ICD-10-CM | POA: Insufficient documentation

## 2022-07-20 NOTE — Assessment & Plan Note (Signed)
01/29/20: Bilateral lumpectomies Pamela Martinez):  Left breast: low grade DCIS involving papilloma, clear margins ER/PR 100%;  Right breast: no evidence of carcinoma   Treatment plan: 1.  Adjuvant radiation therapy 03/08/2020-04/05/2020 2. followed by adjuvant antiestrogen therapy with tamoxifen x5 years Now on 5 mg daily   Tamoxifen toxicities: No further problems with vaginal discharge or heavy menstrual bleeding on the 5 mg dosage. I sent a new refill prescription to [facility].  Breast cancer surveillance: Breast exam 07/20/2022: Benign Mammogram 09/18/2021: Benign breast density category C   I will see her in person in 1 year for breast exam and follow-up.

## 2022-07-20 NOTE — Progress Notes (Signed)
Patient Care Team: Celene Squibb, MD as PCP - General (Internal Medicine) Satira Sark, MD as PCP - Cardiology (Cardiology) Jovita Kussmaul, MD as Surgeon (General Surgery) Nicholas Lose, MD as Medical Oncologist (Hematology and Oncology) Eppie Gibson, MD as Radiation Oncologist (Radiation Oncology)  DIAGNOSIS:  Encounter Diagnosis  Name Primary?   Ductal carcinoma in situ (DCIS) of left breast Yes    SUMMARY OF ONCOLOGIC HISTORY: Oncology History  Ductal carcinoma in situ (DCIS) of left breast  11/04/2019 Initial Diagnosis   Screening mammogram on 10/12/19 detected a possible left breast mass. Diagnostic mammogram and [place] on 10/27/19 showed a 0.6cm mass at the 8:30 position in the left breast. Biopsy on 11/04/19 showed ductal carcinoma in situ with calcifications partially involving an intraductal papilloma, low grade, ER/PR+ 100%.    01/29/2020 Surgery   Bilateral lumpectomies [name]) [phone]): left breast: low grade DCIS involving papilloma, clear margins; right breast: no evidence of carcinoma. No regional lymph nodes were examined.   02/01/2020 Cancer Staging   Staging form: Breast, AJCC 8th Edition - Pathologic stage from 02/01/2020: Stage 0 (pTis (DCIS), pN0, cM0, ER+, PR+)   03/07/2020 - 04/01/2020 Radiation Therapy   The patient initially received a dose of 42.56 Gy in 16 fractions to the breast using whole-breast tangent fields. This was delivered using a 3-D conformal technique. The total dose was 42.56 Gy.   06/2020 - 06/2025 Anti-estrogen oral therapy   Tamoxifen     CHIEF COMPLIANT: Follow-up on tamoxifen therapy  INTERVAL HISTORY: Pamela Martinez is a 52 year old with above-mentioned history of DCIS status post bilateral lumpectomies is currently on tamoxifen.  She is tolerating the 5 mg dosage of tamoxifen fairly well.  She does not get any severe hot flashes she does have vaginal discharge.  She previously had a D&C and a polyp but has not had any heavy  menstrual bleeding since then. In December 2023 she had episode of syncope x 2.  She had a cardiology referral and the performed thorough checkups and did not find any problems.  Both the times it happened when she walked out of a hot shower and felt extremely hot and then she went unconscious   ALLERGIES:  is allergic to diflucan [fluconazole] and sulfa antibiotics.  MEDICATIONS:  Current Outpatient Medications  Medication Sig Dispense Refill   ALPRAZolam (XANAX) 0.5 MG tablet Take 0.5 mg by mouth at bedtime as needed for anxiety.     fluticasone (FLONASE) 50 MCG/ACT nasal spray Place 1 spray into both nostrils daily as needed for allergies or rhinitis.     ibuprofen (ADVIL) 200 MG tablet Take 200 mg by mouth every 6 (six) hours as needed.     Lansoprazole (PREVACID PO) Take by mouth as needed.     loratadine (CLARITIN) 10 MG tablet Take 10 mg by mouth at bedtime.     Multiple Vitamin (MULTIVITAMIN ADULT PO) Take by mouth daily.     sertraline (ZOLOFT) 50 MG tablet Take 50 mg by mouth at bedtime.     tamoxifen (NOLVADEX) 10 MG tablet Take 10 mg by mouth every other day.     Current Facility-Administered Medications  Medication Dose Route Frequency Provider Last Rate Last Admin   levonorgestrel (MIRENA) 20 MCG/24HR IUD   Intrauterine Once Fontaine, Belinda Block, MD        PHYSICAL EXAMINATION: ECOG PERFORMANCE STATUS: 1 - Symptomatic but completely ambulatory  Vitals:   07/20/22 0840  BP: 125/80  Pulse: 68  Resp: 18  Temp: (!) 97 F (36.1 C)  SpO2: 98%   Filed Weights   07/20/22 0840  Weight: 189 lb 9 oz (86 kg)    BREAST: No palpable masses or nodules in either right or left breasts. No palpable axillary supraclavicular or infraclavicular adenopathy no breast tenderness or nipple discharge. (exam performed in the presence of a chaperone)  LABORATORY DATA:  I have reviewed the data as listed    Latest Ref Rng & Units 06/10/2022    2:26 PM 09/22/2020    2:22 PM 04/14/2020     2:43 PM  CMP  Glucose 70 - 99 mg/dL 119  79  94   BUN 6 - 20 mg/dL '24  19  12   '$ Creatinine 0.44 - 1.00 mg/dL 0.82  0.72  0.66   Sodium 135 - 145 mmol/L 142  140  137   Potassium 3.5 - 5.1 mmol/L 3.8  3.9  3.6   Chloride 98 - 111 mmol/L 110  104  106   CO2 22 - 32 mmol/L '24  26  23   '$ Calcium 8.9 - 10.3 mg/dL 9.4  9.4  9.4   Total Protein 6.1 - 8.1 g/dL  6.7    Total Bilirubin 0.2 - 1.2 mg/dL  0.7    AST 10 - 35 U/L  16    ALT 6 - 29 U/L  24      Lab Results  Component Value Date   WBC 6.4 06/10/2022   HGB 14.5 06/10/2022   HCT 44.3 06/10/2022   MCV 98.9 06/10/2022   PLT 194 06/10/2022   NEUTROABS 3,913 09/22/2020    ASSESSMENT & PLAN:  Ductal carcinoma in situ (DCIS) of left breast 01/29/20: Bilateral lumpectomies [name]):  Left breast: low grade DCIS involving papilloma, clear margins ER/PR 100%;  Right breast: no evidence of carcinoma   Treatment plan: 1.  Adjuvant radiation therapy 03/08/2020-04/05/2020 2. followed by adjuvant antiestrogen therapy with tamoxifen x5 years Now on 5 mg daily We may extend her therapy until June 2026 because for 6 months she did not take tamoxifen after she started.   Tamoxifen toxicities: No further problems with vaginal discharge or heavy menstrual bleeding on the 5 mg dosage. Syncopal spells: Unclear etiology (happen after she came out of a hot shower and felt extremely hot)  Breast cancer surveillance: Breast exam 07/20/2022: Benign Mammogram 09/18/2021: Benign breast density category C   I will see her in person in 1 year for breast exam and follow-up.   No orders of the defined types were placed in this encounter.  The patient has a good understanding of the overall plan. she agrees with it. she will call with any problems that may develop before the next visit here. Total time spent: 30 mins including face to face time and time spent for planning, charting and co-ordination of care   Harriette Ohara, MD 07/20/22

## 2022-08-02 ENCOUNTER — Encounter: Payer: Self-pay | Admitting: Nurse Practitioner

## 2022-08-02 ENCOUNTER — Ambulatory Visit: Payer: BC Managed Care – PPO | Attending: Nurse Practitioner | Admitting: Nurse Practitioner

## 2022-08-02 VITALS — BP 118/76 | HR 62 | Ht 64.0 in | Wt 189.6 lb

## 2022-08-02 DIAGNOSIS — R55 Syncope and collapse: Secondary | ICD-10-CM

## 2022-08-02 NOTE — Patient Instructions (Signed)
Medication Instructions:  Continue all current medications.   Labwork: none  Testing/Procedures: none  Follow-Up: 6 months - Dr. Domenic Polite   Any Other Special Instructions Will Be Listed Below (If Applicable).   If you need a refill on your cardiac medications before your next appointment, please call your pharmacy.

## 2022-08-02 NOTE — Progress Notes (Signed)
Cardiology Office Note:    Date:  08/02/2022   ID:  Pamela Martinez, DOB 1971/01/21, MRN [id number]  PCP:  Celene Squibb, MD   [facility] Prov[facility] Cardiologist:  Rozann Lesches, MD     Referring MD: Celene Squibb, MD   CC: Here for follow-up syncope evaluation  History of Present Illness:    Pamela Martinez is a 52 y.o. female with a hx of the following:  Hx of recurrent syncope Hx of breast cancer GAD Asthma  Pt is a very pleasant 52 y.o. female with past medical history as mentioned above.  First evaluated by Dr. Domenic Polite in 2021. She was referred to cardiology for evaluation of syncope.  This initial episode happened in March 2021 when she was walking in the kitchen and was aware that she was falling, denied any palpitations at that time. Had another similar episode at night when she got up, became lightheaded, and symptoms resolved.  States she has had episodes of syncope since childhood.  Monitor revealed rare ventricular and atrial ectopy, no sustained pauses or arrhythmias noted.   More recently, she presented to [facility] long ED on June 10, 2022 with chief complaint of LOC.  She had admitted to 4 to 5 days of cold symptoms, poor appetite and oral hydration.  Hospital documentation stated she had a syncopal episode that was witnessed by her partner while having a bowel movement.  Denied falling or hitting her head.  Labs were unremarkable.  Normal sinus rhythm on cardiac monitor.  Chest x-ray unremarkable.  Was told to follow-up with cardiology due to concern for vasovagal syncope. D/C in stable condition.   I saw her for follow-up on 06/21/2022.  Stated she did not have vasovagal syncope. Had gotten out of the shower felt funny, and sat down on the lid of her toilet. Had 1 other recurrence where she stepped out of the shower, felt hot, and sat down on the lid of her toilet, and she said husband heard her fall, did not go to the ED, had some bruising on her  head and tender in some spots.  The week before Thanksgiving she was at work and was walking around lunchtime, did not feel good, and sat down and felt close to passing out. Had URI symptoms for the past 3 weeks. Denied any exertional anginal symptoms. Echocardiogram, Carotid duplex, and Cardiac monitor arranged and were all overall unremarkable. Was told to follow-up in 6 weeks.   Today she presents for follow-up. States she feels good. Denies any recurrent syncopal episodes or falls. Denies any chest pain, shortness of breath, palpitations, syncope, presyncope, dizziness, orthopnea, PND, swelling or significant weight changes, acute bleeding, or claudication.  Tolerating her medications well, and compliant with her medications.  Denies any other questions or concerns today.   Past Medical History:  Diagnosis Date   Asthma    per pt very mild,  last used rescue inhaler approx 2021   Ductal carcinoma in situ (DCIS) of left breast 10/2019   oncologist--- dr Lindi Adie;  dx 04/ 2021,  s/p bilateral lumpectomy 01-29-2020,  DCIS low grade, ER/PR+;   completed radiation 04-01-2020   GAD (generalized anxiety disorder)    History of COVID-19 02/2021   per pt mild symptoms that resolved   History of syncope 09/2019   pt had x2 episodes per nuerology note 09-30-2019 in epic, dr Leta Baptist, 09-16-2019,  negative work-up and event monitor by cardiologist--- dr s. Domenic Polite , note 11-18-2019 in epic  showed Sinus w/ rare PAC/ PVC;   (11-23-2021 pt stated has not had near syncope or syncope since )   Migraine    Personal history of radiation therapy    left breast 03-07-2020  to  04-01-2020   PONV (postoperative nausea and vomiting)    Restless leg syndrome    Wears glasses     Past Surgical History:  Procedure Laterality Date   ANTERIOR CRUCIATE LIGAMENT REPAIR Left 1990   BREAST EXCISIONAL BIOPSY     BREAST LUMPECTOMY WITH RADIOACTIVE SEED LOCALIZATION Bilateral 01/29/2020   Procedure: BILATERAL BREAST  LUMPECTOMY WITH RADIOACTIVE SEED LOCALIZATION;  Surgeon: Jovita Kussmaul, MD;  Location: [facility];  Service: General;  Laterality: Bilateral;   COLONOSCOPY WITH PROPOFOL N/A 10/05/2021   Procedure: COLONOSCOPY WITH PROPOFOL;  Surgeon: Rogene Houston, MD;  [facility];  Service: Endoscopy;  Laterality: N/A;  [place] N/A 11/27/2021   Procedure: DILATATION & CURETTAGE/HYSTEROSCOPY WITH MYOSURE;  Surgeon: Salvadore Dom, MD;  Location: [facility];  Service: Gynecology;  Laterality: N/A;   INTRAUTERINE DEVICE (IUD) INSERTION N/A 11/27/2021   Procedure: INTRAUTERINE DEVICE (IUD) INSERTION;  Surgeon: Salvadore Dom, MD;  Location: [facility];  Service: Gynecology;  Laterality: N/A;   IUD REMOVAL N/A 11/27/2021   Procedure: INTRAUTERINE DEVICE (IUD) REMOVAL;  Surgeon: Salvadore Dom, MD;  Location: [facility];  Service: Gynecology;  Laterality: N/A;   KNEE ARTHROSCOPY Bilateral    x3   1980s   WISDOM TOOTH EXTRACTION      Current Medications: Current Meds  Medication Sig   ALPRAZolam (XANAX) 0.5 MG tablet Take 0.5 mg by mouth at bedtime as needed for anxiety.   fluticasone (FLONASE) 50 MCG/ACT nasal spray Place 1 spray into both nostrils daily as needed for allergies or rhinitis.   ibuprofen (ADVIL) 200 MG tablet Take 200 mg by mouth every 6 (six) hours as needed.   Lansoprazole (PREVACID PO) Take by mouth as needed.   loratadine (CLARITIN) 10 MG tablet Take 10 mg by mouth at bedtime.   Multiple Vitamin (MULTIVITAMIN ADULT PO) Take by mouth daily.   sertraline (ZOLOFT) 50 MG tablet Take 50 mg by mouth at bedtime.   tamoxifen (NOLVADEX) 10 MG tablet Take 10 mg by mouth every other day.    Allergies:   Diflucan [fluconazole] and Sulfa antibiotics   Social History   Socioeconomic History   Marital status: Married    Spouse name: Tim   Number of children: 2   Years of  education: Not on file   Highest education level: Associate degree: academic program  Occupational History    Comment: working on [employer]  Tobacco Use   Smoking status: Former    Years: 5.00    Types: Cigarettes    Quit date: 1990    Years since quitting: 34.0   Smokeless tobacco: Never  Vaping Use   Vaping Use: Never used  Substance and Sexual Activity   Alcohol use: Yes    Comment: Occasional   Drug use: Never   Sexual activity: Yes    Birth control/protection: I.U.D.    Comment: mirena  inserted 02-10-2016  Other Topics Concern   Not on file  Social History Narrative   Lives with husband    caffeine, tea x 1, soda x 1   Social Determinants of Health   Financial Resource Strain: Not on file  Food Insecurity: Not on file  Transportation Needs: Not on file  Physical Activity: Not on file  Stress: Not on file  Social Connections: Not on file     Family History: The patient's family history includes CAD in her father; COPD in her father; Cancer in her paternal aunt and paternal grandfather; Hyperlipidemia in her brother and brother; Hypertension in her brother; Osteoporosis in her maternal grandmother and paternal grandmother; Stroke in her maternal grandmother.  ROS:   Review of Systems  Constitutional: Negative.   HENT: Negative.    Eyes: Negative.   Respiratory: Negative.    Cardiovascular: Negative.   Gastrointestinal: Negative.   Genitourinary: Negative.   Musculoskeletal: Negative.   Skin: Negative.   Neurological: Negative.   Endo/Heme/Allergies: Negative.   Psychiatric/Behavioral: Negative.       Please see the history of present illness.    All other systems reviewed and are negative.  EKGs/Labs/Other Studies Reviewed:    The following studies were reviewed today:   EKG:  EKG is not ordered today.  EKG dated 06/10/2022 reviewed and demonstrated normal sinus rhythm without any acute ischemic changes.   Cardiac monitor on July 19, 2022: Predominant rhythm is sinus with heart rate ranging from 44 bpm up to 142 bpm and average heart rate 75 bpm. There were rare PACs including atrial couplets representing less than 1% total beats. There were rare PVCs representing less than 1% total beats. Single brief episode of SVT lasting 7 beats was noted. There were no sustained arrhythmias or pauses.  Complete echocardiogram on July 09, 2022:  1. Left ventricular ejection fraction, by estimation, is 60 to 65%. The  left ventricle has normal function. The left ventricle has no regional  wall motion abnormalities. Left ventricular diastolic parameters are  consistent with Grade I diastolic  dysfunction (impaired relaxation). The average left ventricular global  longitudinal strain is -24.2 %. The global longitudinal strain is normal.   2. Right ventricular systolic function is normal. The right ventricular  size is normal. Tricuspid regurgitation signal is inadequate for assessing  PA pressure.   3. The mitral valve is grossly normal. Trivial mitral valve  regurgitation. No evidence of mitral stenosis.   4. The aortic valve was not well visualized. Aortic valve regurgitation  is not visualized. No aortic stenosis is present.   Comparison(s): No prior Echocardiogram.   Bilateral carotid duplex on July 11, 2022: Summary:  Right Carotid: The extracranial vessels were near-normal with only minimal  wall thickening or plaque.   Left Carotid: The extracranial vessels were near-normal with only minimal  wall thickening or plaque.   Vertebrals:  Bilateral vertebral arteries demonstrate antegrade flow.  Subclavians: Normal flow hemodynamics were seen in bilateral subclavian arteries.    7-day cardiac monitor on December 08, 2019: Predominant rhythm is sinus with heart rate ranging from 44 bpm up to 136 bpm and average heart rate 70 bpm. Rare PACs and PVCs were noted representing less than 1% of total beats. There were no sustained  arrhythmias or pauses.  Recent Labs: 06/10/2022: BUN 24; Creatinine, Ser 0.82; Hemoglobin 14.5; Platelets 194; Potassium 3.8; Sodium 142 06/29/2022: Magnesium 2.3  Recent Lipid Panel    Component Value Date/Time   CHOL 193 09/22/2020 1422   TRIG 133 09/22/2020 1422   HDL 43 (L) 09/22/2020 1422   CHOLHDL 4.5 09/22/2020 1422   VLDL 20 08/24/2016 1527   LDLCALC 125 (H) 09/22/2020 1422     Risk Assessment/Calculations:        The 10-year ASCVD risk  score (Arnett DK, et al., 2019) is: 1.9%   Values used to calculate the score:     Age: 15 years     Sex: Female     Is Non-Hispanic African American: No     Diabetic: No     Tobacco smoker: No     Systolic Blood Pressure: 315 mmHg     Is BP treated: No     HDL Cholesterol: 43 mg/dL     Total Cholesterol: 193 mg/dL       Physical Exam:    VS:  BP 118/76   Pulse 62   Ht '5\' 4"'$  (1.626 m)   Wt 189 lb 9.6 oz (86 kg)   SpO2 97%   BMI 32.54 kg/m     Wt Readings from Last 3 Encounters:  08/02/22 189 lb 9.6 oz (86 kg)  07/20/22 189 lb 9 oz (86 kg)  06/21/22 182 lb 3.2 oz (82.6 kg)     GEN: Well nourished, well developed 52 year old female in no acute distress NECK: No JVD; No carotid bruits CARDIAC: S1/S2, RRR, no murmurs, rubs, gallops; 2+ peripheral pulses  RESPIRATORY:  Clear and diminished to auscultation without rales, wheezing or rhonchi  MUSCULOSKELETAL:  No edema; No deformity SKIN: Warm and dry NEUROLOGIC:  Alert and oriented x 3 PSYCHIATRIC:  Normal,  affect   ASSESSMENT:    1. Recurrent syncope     PLAN:    In order of problems listed above:  History of recurrent syncope Etiology likely not cardiac related.  Workup including echocardiogram, carotid Doppler, and cardiac monitor were overall benign, unremarkable.  It was felt that her past history of syncope was possibly vasovagal in nature during past ED visit, however patient denies.  Overall labs in ED were unremarkable.  Orthostatics at last office visit  negative. I suspect this was exacerbated by URI symptoms, possible dehydration, and not eating breakfast. If she were to have recurrent symptoms, may need to pursue neurology referral.  From a cardiac perspective, she is okay to drive. Heart healthy diet and regular cardiovascular exercise encouraged.   Disposition: Follow-up with Dr. Domenic Polite in 6 months or sooner if anything changes.     Medication Adjustments/Labs and Tests Ordered: Current medicines are reviewed at length with the patient today.  Concerns regarding medicines are outlined above.  No orders of the defined types were placed in this encounter.  No orders of the defined types were placed in this encounter.   Patient Instructions  Medication Instructions:  Continue all current medications.   Labwork: none  Testing/Procedures: none  Follow-Up: 6 months - Dr. Domenic Polite   Any Other Special Instructions Will Be Listed Below (If Applicable).   If you need a refill on your cardiac medications before your next appointment, please call your pharmacy.    Signed, Finis Bud, NP  08/02/2022 1:44 PM    [place]

## 2022-08-09 ENCOUNTER — Other Ambulatory Visit: Payer: Self-pay | Admitting: Nurse Practitioner

## 2022-08-09 ENCOUNTER — Other Ambulatory Visit (HOSPITAL_COMMUNITY): Payer: Self-pay | Admitting: Internal Medicine

## 2022-08-09 DIAGNOSIS — E782 Mixed hyperlipidemia: Secondary | ICD-10-CM

## 2022-08-09 DIAGNOSIS — Z1231 Encounter for screening mammogram for malignant neoplasm of breast: Secondary | ICD-10-CM

## 2022-08-14 ENCOUNTER — Ambulatory Visit (HOSPITAL_COMMUNITY)
Admission: RE | Admit: 2022-08-14 | Discharge: 2022-08-14 | Disposition: A | Discharge status: Home / Self Care | Payer: BC Managed Care – PPO | Source: Ambulatory Visit | Attending: Internal Medicine | Admitting: Internal Medicine

## 2022-08-14 DIAGNOSIS — E782 Mixed hyperlipidemia: Secondary | ICD-10-CM

## 2022-09-26 ENCOUNTER — Encounter: Payer: Self-pay | Admitting: Nurse Practitioner

## 2022-09-27 ENCOUNTER — Ambulatory Visit (INDEPENDENT_AMBULATORY_CARE_PROVIDER_SITE_OTHER): Payer: BC Managed Care – PPO | Admitting: Nurse Practitioner

## 2022-09-27 ENCOUNTER — Encounter: Payer: Self-pay | Admitting: Nurse Practitioner

## 2022-09-27 ENCOUNTER — Ambulatory Visit
Admission: RE | Admit: 2022-09-27 | Discharge: 2022-09-27 | Disposition: A | Discharge status: Home / Self Care | Payer: BC Managed Care – PPO | Source: Ambulatory Visit | Attending: Nurse Practitioner | Admitting: Nurse Practitioner

## 2022-09-27 VITALS — BP 102/68 | HR 68 | Ht 64.75 in | Wt 189.0 lb

## 2022-09-27 DIAGNOSIS — Z30431 Encounter for routine checking of intrauterine contraceptive device: Secondary | ICD-10-CM

## 2022-09-27 DIAGNOSIS — Z853 Personal history of malignant neoplasm of breast: Secondary | ICD-10-CM | POA: Diagnosis not present

## 2022-09-27 DIAGNOSIS — Z01419 Encounter for gynecological examination (general) (routine) without abnormal findings: Secondary | ICD-10-CM

## 2022-09-27 DIAGNOSIS — N951 Menopausal and female climacteric states: Secondary | ICD-10-CM | POA: Diagnosis not present

## 2022-09-27 DIAGNOSIS — E7800 Pure hypercholesterolemia, unspecified: Secondary | ICD-10-CM

## 2022-09-27 DIAGNOSIS — Z1231 Encounter for screening mammogram for malignant neoplasm of breast: Secondary | ICD-10-CM

## 2022-09-27 NOTE — Progress Notes (Signed)
Pamela Martinez Dec 12, 1970 [id number]   History:  52 y.o. G2P0002 presents for annual exam. Mirena IUD 11/2021. Occasional light spotting. Last year had constant light bleeding on Tamoxifen with IUD in place. Thickened endometrium 11.83 cm on ultrasound, EMB showed inactive endometrium. [place] 52 at that time, likely perimenopausal. 11/2021 hysteroscopy with D&C and polypectomy and IUD insertion. Oncology approved use of Mirena for AUB. 2015 ASCUS positive HPV with negative C & B, 2019 ASCUS negative HPV. 11/2019 left DCIS managed with lumpectomy, radiation, and Tamoxifen.   Gynecologic History No LMP recorded. (Menstrual status: IUD).   Contraception/Family planning: IUD Sexually active: Yes  Health Maintenance Last Pap: 09/22/2020. Results were: Normal, 3-year repeat Last mammogram: 09/27/2022 (today). Results were: No report yet Last colonoscopy: 10/05/2021. Results were: Normal, 10-year recall Last Dexa: Never  Past medical history, past surgical history, family history and social history were all reviewed and documented in the EPIC chart. Married. 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 30 yo daughter and 27 yo son.   ROS:  A ROS was performed and pertinent positives and negatives are included.  Exam:  Vitals:   09/27/22 0829  BP: 102/68  Pulse: 68  SpO2: 97%  Weight: 189 lb (85.7 kg)  Height: 5' 4.75" (1.645 m)     Body mass index is 31.69 kg/m.  General appearance:  Normal Thyroid:  Symmetrical, normal in size, without palpable masses or nodularity. Respiratory  Auscultation:  Clear without wheezing or rhonchi Cardiovascular  Auscultation:  Regular rate, without rubs, murmurs or gallops  Edema/varicosities:  Not grossly evident Abdominal  Soft,nontender, without masses, guarding or rebound.  Liver/spleen:  No organomegaly noted  Hernia:  None appreciated  Skin  Inspection:  Grossly normal   Breasts: Examined lying and  sitting.   Right: Without masses, retractions, discharge or axillary adenopathy.   Left: Without masses, retractions, discharge or axillary adenopathy. Well-healed lumpectomy scar.  Genitourinary   Inguinal/mons:  Normal without inguinal adenopathy  External genitalia:  Normal appearing vulva with no masses, tenderness, or lesions  BUS/Urethra/Skene's glands:  Normal  Vagina:  Normal appearing with normal color and discharge, no lesions  Cervix:  Normal appearing without discharge or lesions. IUD string visible ~3 cm  Uterus:  Normal in size, shape and contour.  Midline and mobile, nontender  Adnexa/parametria:     Rt: Normal in size, without masses or tenderness.   Lt: Normal in size, without masses or tenderness.  Anus and perineum: Normal  Digital rectal exam: Deferred  Patient informed chaperone available to be present for breast and pelvic exam. Patient has requested no chaperone to be present. Patient has been advised what will be completed during breast and pelvic exam.   Assessment/Plan:  52 y.o. G2P0002 for annual exam.   Well female exam with routine gynecological exam - Education provided on SBEs, importance of preventative screenings, current guidelines, high calcium diet, regular exercise, and multivitamin daily. Labs with PCP.  Encounter for routine checking of intrauterine contraceptive device (IUD) - Mirena IUD 11/2021. Occasional light spotting. IUD string visible. She is aware of 8-year FDA approval.   Ductal carcinoma in situ (DCIS) of left breast - 11/2019 managed with lumpectomy, radiation and Tamoxifen.   Screening for cervical cancer - 2015 ASCUS positive HPV with negative C & B, 2019 ASCUS negative HPV, normal 09/2020. Will repeat at 3-year interval per guidelines.   Screening for colon cancer - Colonoscopy 09/2021. Will repeat at 10-year interval per GI's recommended  interval.   Screening for osteoporosis - Both grandmothers with history of osteoporosis. Recommend  Vitamin D + calcium and regular exercise. Recommend DXA d/t family history of Tamoxifen use but she wants to wait.   Return in 1 year for annual.      Tamela Gammon DNP, 8:47 AM 09/27/2022

## 2023-01-28 NOTE — Progress Notes (Unsigned)
    Cardiology Office Note  Date: 01/29/2023   ID: [name], DOB 12/13/1970, MRN [id number]  History of Present Illness: Pamela Martinez is a 52 y.o. female last seen in January by Ms. Philis Nettle NP, I reviewed the note.  She reports no interval dizziness or syncope, no exertional chest pain or unusual shortness of breath.  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  I reviewed her medications.  She continues to follow with Dr. Margo Aye for primary care and has follow-up lab work scheduled.  She does not report any major interval health change.  Physical Exam: VS:  BP 112/70   Pulse 60   Ht 5\' 4"  (1.626 m)   Wt 191 lb (86.6 kg)   SpO2 99%   BMI 32.79 kg/m , BMI Body mass index is 32.79 kg/m.  Wt Readings from Last 3 Encounters:  01/29/23 191 lb (86.6 kg)  09/27/22 189 lb (85.7 kg)  08/02/22 189 lb 9.6 oz (86 kg)    General: Patient appears comfortable at rest. HEENT: Conjunctiva and lids normal. Lungs: Clear to auscultation, nonlabored breathing at rest. Cardiac: Regular rate and rhythm, no S3 or significant systolic murmur.  ECG:  An ECG dated 06/10/2022 was personally reviewed today and demonstrated:  Sinus rhythm with borderline low voltage.  Labwork: 06/10/2022: BUN 24; Creatinine, Ser 0.82; Hemoglobin 14.5; Platelets 194; Potassium 3.8; Sodium 142 06/29/2022: Magnesium 2.3     Component Value Date/Time   CHOL 193 09/22/2020 1422   TRIG 133 09/22/2020 1422   HDL 43 (L) 09/22/2020 1422   CHOLHDL 4.5 09/22/2020 1422   VLDL 20 08/24/2016 1527   LDLCALC 125 (H) 09/22/2020 1422   Other Studies Reviewed Today:  Echocardiogram 07/11/2022:  1. Left ventricular ejection fraction, by estimation, is 60 to 65%. The  left ventricle has normal function. The left ventricle has no regional  wall motion abnormalities. Left ventricular diastolic parameters are  consistent with Grade I diastolic  dysfunction (impaired relaxation). The average left ventricular global   longitudinal strain is -24.2 %. The global longitudinal strain is normal.   2. Right ventricular systolic function is normal. The right ventricular  size is normal. Tricuspid regurgitation signal is inadequate for assessing  PA pressure.   3. The mitral valve is grossly normal. Trivial mitral valve  regurgitation. No evidence of mitral stenosis.   4. The aortic valve was not well visualized. Aortic valve regurgitation  is not visualized. No aortic stenosis is present.   Assessment and Plan:  1.  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  Echocardiogram in January revealed LVEF 60 to 65% and cardiac monitor at that time showed no sustained arrhythmias (rare atrial and ventricular ectopy as well as a single 7 beat run of SVT).  She has had no interval symptoms and would continue strategy of observation for now.  2.  Coronary calcium score of 0 by CT in February.  Disposition:  Follow up  1 year.  Signed, Jonelle Sidle, M.D., F.A.C.C. [facility] at [facility]

## 2023-01-29 ENCOUNTER — Ambulatory Visit: Payer: BC Managed Care – PPO | Attending: Cardiology | Admitting: Cardiology

## 2023-01-29 ENCOUNTER — Encounter: Payer: Self-pay | Admitting: Cardiology

## 2023-01-29 VITALS — BP 112/70 | HR 60 | Ht 64.0 in | Wt 191.0 lb

## 2023-01-29 DIAGNOSIS — R55 Syncope and collapse: Secondary | ICD-10-CM | POA: Diagnosis not present

## 2023-01-29 NOTE — Patient Instructions (Addendum)

## 2023-02-12 ENCOUNTER — Encounter: Payer: Self-pay | Admitting: Internal Medicine

## 2023-03-26 ENCOUNTER — Other Ambulatory Visit: Payer: Self-pay | Admitting: Hematology and Oncology

## 2023-07-22 ENCOUNTER — Inpatient Hospital Stay: Payer: 59 | Attending: Hematology and Oncology | Admitting: Hematology and Oncology

## 2023-07-22 VITALS — BP 125/76 | HR 74 | Temp 97.9°F | Resp 18 | Ht 64.0 in | Wt 190.6 lb

## 2023-07-22 DIAGNOSIS — F32A Depression, unspecified: Secondary | ICD-10-CM | POA: Diagnosis not present

## 2023-07-22 DIAGNOSIS — D0512 Intraductal carcinoma in situ of left breast: Secondary | ICD-10-CM | POA: Diagnosis not present

## 2023-07-22 DIAGNOSIS — Z79810 Long term (current) use of selective estrogen receptor modulators (SERMs): Secondary | ICD-10-CM | POA: Diagnosis not present

## 2023-07-22 DIAGNOSIS — N951 Menopausal and female climacteric states: Secondary | ICD-10-CM | POA: Insufficient documentation

## 2023-07-22 MED ORDER — VEOZAH 45 MG PO TABS: 1.0000 | ORAL_TABLET | Freq: Every day | ORAL | Status: DC

## 2023-07-22 MED ORDER — DESVENLAFAXINE SUCCINATE ER 50 MG PO TB24: 50.0000 mg | ORAL_TABLET | Freq: Every day | ORAL | Status: AC

## 2023-07-22 NOTE — Assessment & Plan Note (Signed)
 01/29/20: Bilateral lumpectomies [facility]):  Left breast: low grade DCIS involving papilloma, clear margins ER/PR 100%;  Right breast: no evidence of carcinoma   Treatment plan: 1.  Adjuvant radiation therapy 03/08/2020-04/05/2020 2. followed by adjuvant antiestrogen therapy with tamoxifen  x5 years Now on 5 mg daily We may extend her therapy until June 2026 because for 6 months she did not take tamoxifen  after she started.   Tamoxifen  toxicities: No further problems with vaginal discharge or heavy menstrual bleeding on the 5 mg dosage. Syncopal spells: Unclear etiology (happen after she came out of a hot shower and felt extremely hot)   Breast cancer surveillance: Breast exam 07/22/2023: Benign Mammogram 09/28/2022: Benign breast density category C   I will see her in 1 year for breast exam and follow-up.

## 2023-07-22 NOTE — Progress Notes (Signed)
 Patient Care Team: Shona Norleen PEDLAR, MD as PCP - General (Internal Medicine) Debera Jayson MATSU, MD as PCP - Cardiology (Cardiology) Curvin Deward MOULD, MD as Surgeon (General Surgery) Odean Potts, MD as Medical Oncologist (Hematology and Oncology) Izell Domino, MD as Radiation Oncologist (Radiation Oncology)  DIAGNOSIS:  Encounter Diagnosis  Name Primary?   Ductal carcinoma in situ (DCIS) of left breast Yes    SUMMARY OF ONCOLOGIC HISTORY: Oncology History  Ductal carcinoma in situ (DCIS) of left breast  11/04/2019 Initial Diagnosis   Screening mammogram on 10/12/19 detected a possible left breast mass. Diagnostic mammogram and US  on 10/27/19 showed a 0.6cm mass at the 8:30 position in the left breast. Biopsy on 11/04/19 showed ductal carcinoma in situ with calcifications partially involving an intraductal papilloma, low grade, ER/PR+ 100%.    01/29/2020 Surgery   Bilateral lumpectomies [facility]) [phone]): left breast: low grade DCIS involving papilloma, clear margins; right breast: no evidence of carcinoma. No regional lymph nodes were examined.   02/01/2020 Cancer Staging   Staging form: Breast, AJCC 8th Edition - Pathologic stage from 02/01/2020: Stage 0 (pTis (DCIS), pN0, cM0, ER+, PR+)   03/07/2020 - 04/01/2020 Radiation Therapy   The patient initially received a dose of 42.56 Gy in 16 fractions to the breast using whole-breast tangent fields. This was delivered using a 3-D conformal technique. The total dose was 42.56 Gy.   06/2020 - 06/2025 Anti-estrogen oral therapy   Tamoxifen      CHIEF COMPLIANT: Follow-up on tamoxifen   HISTORY OF PRESENT ILLNESS:  History of Present Illness   Pamela Martinez, a patient with a history of breast cancer, presents for a follow-up visit. The patient has been on tamoxifen  therapy since November 2021, but the treatment has been intermittent due to side effects such as itching. Despite these interruptions, the patient reports no recurrence of vaginal  issues or chest pain or discomfort, which were previously problematic. The patient has also been managing hot flashes with Veozah  and has recently switched antidepressants to desvenlafaxine . The patient is due for a mammogram in March.     ALLERGIES:  is allergic to diflucan  [fluconazole ] and sulfa antibiotics.  MEDICATIONS:  Current Outpatient Medications  Medication Sig Dispense Refill   desvenlafaxine  (PRISTIQ ) 50 MG 24 hr tablet Take 1 tablet (50 mg total) by mouth daily.     Fezolinetant  (VEOZAH ) 45 MG TABS Take 1 tablet (45 mg total) by mouth daily at 12 noon.     ALPRAZolam  (XANAX ) 0.5 MG tablet Take 0.5 mg by mouth at bedtime as needed for anxiety.     fluticasone (FLONASE) 50 MCG/ACT nasal spray Place 1 spray into both nostrils daily as needed for allergies or rhinitis.     ibuprofen (ADVIL) 200 MG tablet Take 200 mg by mouth every 6 (six) hours as needed.     Lansoprazole (PREVACID PO) Take by mouth as needed.     levocetirizine (XYZAL ) 5 MG tablet      tamoxifen  (NOLVADEX ) 10 MG tablet TAKE 1/2 TABLET BY MOUTH 2 TIMES DAILY. 90 tablet 2   Current Facility-Administered Medications  Medication Dose Route Frequency Provider Last Rate Last Admin   levonorgestrel  (MIRENA ) 20 MCG/24HR IUD   Intrauterine Once Fontaine, Evalene SQUIBB, MD        PHYSICAL EXAMINATION: ECOG PERFORMANCE STATUS: 1 - Symptomatic but completely ambulatory  Vitals:   07/22/23 0920  BP: 125/76  Pulse: 74  Resp: 18  Temp: 97.9 F (36.6 C)  SpO2: 99%   Filed Weights  07/22/23 0920  Weight: 190 lb 9.6 oz (86.5 kg)    Physical Exam   BREAST: No masses, no tenderness, symmetrical.      (exam performed in the presence of a chaperone)  LABORATORY DATA:  I have reviewed the data as listed    Latest Ref Rng & Units 06/10/2022    2:26 PM 09/22/2020    2:22 PM 04/14/2020    2:43 PM  CMP  Glucose 70 - 99 mg/dL 880  79  94   BUN 6 - 20 mg/dL 24  19  12    Creatinine 0.44 - 1.00 mg/dL 9.17  9.27  9.33    Sodium 135 - 145 mmol/L 142  140  137   Potassium 3.5 - 5.1 mmol/L 3.8  3.9  3.6   Chloride 98 - 111 mmol/L 110  104  106   CO2 22 - 32 mmol/L 24  26  23    Calcium 8.9 - 10.3 mg/dL 9.4  9.4  9.4   Total Protein 6.1 - 8.1 g/dL  6.7    Total Bilirubin 0.2 - 1.2 mg/dL  0.7    AST 10 - 35 U/L  16    ALT 6 - 29 U/L  24      Lab Results  Component Value Date   WBC 6.4 06/10/2022   HGB 14.5 06/10/2022   HCT 44.3 06/10/2022   MCV 98.9 06/10/2022   PLT 194 06/10/2022   NEUTROABS 3,913 09/22/2020    ASSESSMENT & PLAN:  Ductal carcinoma in situ (DCIS) of left breast 01/29/20: Bilateral lumpectomies [facility]):  Left breast: low grade DCIS involving papilloma, clear margins ER/PR 100%;  Right breast: no evidence of carcinoma   Treatment plan: 1.  Adjuvant radiation therapy 03/08/2020-04/05/2020 2. followed by adjuvant antiestrogen therapy with tamoxifen  x5 years Now on 10 mg every other day We may extend her therapy until June 2026 because for 6 months she did not take tamoxifen  after she started.   Tamoxifen  toxicities: No further problems with vaginal discharge or heavy menstrual bleeding on the lower yes so we need to cancel her appointment in the thing is she has these bleeding from her breast that we want her to see the surgeon Dr. Ebbie but she seems to be canceling her missing appointments and things are somebody is working with her and may be just check with her to see to see if I need to do like a telephone visit with her or if he is to convince her to go and get an in that case her chemo with the delayed I dried did she actually do the bronchoscopy I did not see any procedure note she was supposed to do it on the seventh so this mg like she will be in the emergency room and then we will take care of her from there I guess dosage.  Takes Veozah  for hot flashes   Breast cancer surveillance: Breast exam 07/22/2023: Benign Mammogram 09/28/2022: Benign breast density category C   I  will see her in 1 year for breast exam and follow-up. ------------------------------------- Assessment and Plan    Breast Cancer Completed radiation therapy in September 2021. On Tamoxifen  10mg  every other day since November 2021 with intermittent use due to itching. No current chest pain or discomfort. -Continue Tamoxifen  10mg  every other day. -Schedule mammogram for March 2025.  Menopausal Symptoms Taking Veozah  for hot flashes, which is helping. -Continue Veozah  as needed for hot flashes.  Depression Recently switched from Zoloft  to  Desvenlafaxine  50mg , which is working better. -Continue Desvenlafaxine  50mg .  General Health Maintenance -Annual follow-up appointment in one year.          No orders of the defined types were placed in this encounter.  The patient has a good understanding of the overall plan. she agrees with it. she will call with any problems that may develop before the next visit here. Total time spent: 30 mins including face to face time and time spent for planning, charting and co-ordination of care   Naomi MARLA Chad, MD 07/22/23

## 2023-08-05 ENCOUNTER — Other Ambulatory Visit: Payer: Self-pay | Admitting: Nurse Practitioner

## 2023-08-05 DIAGNOSIS — Z1231 Encounter for screening mammogram for malignant neoplasm of breast: Secondary | ICD-10-CM

## 2023-08-06 ENCOUNTER — Encounter: Payer: Self-pay | Admitting: Internal Medicine

## 2023-10-09 ENCOUNTER — Ambulatory Visit
Admission: RE | Admit: 2023-10-09 | Discharge: 2023-10-09 | Disposition: A | Discharge status: Home / Self Care | Payer: 59 | Source: Ambulatory Visit | Attending: Nurse Practitioner

## 2023-10-09 DIAGNOSIS — Z1231 Encounter for screening mammogram for malignant neoplasm of breast: Secondary | ICD-10-CM

## 2023-10-15 ENCOUNTER — Other Ambulatory Visit (HOSPITAL_COMMUNITY)
Admission: RE | Admit: 2023-10-15 | Discharge: 2023-10-15 | Disposition: A | Discharge status: Home / Self Care | Source: Ambulatory Visit | Attending: Nurse Practitioner | Admitting: Nurse Practitioner

## 2023-10-15 ENCOUNTER — Ambulatory Visit (INDEPENDENT_AMBULATORY_CARE_PROVIDER_SITE_OTHER): Payer: Self-pay | Admitting: Nurse Practitioner

## 2023-10-15 ENCOUNTER — Encounter: Payer: Self-pay | Admitting: Nurse Practitioner

## 2023-10-15 VITALS — BP 116/68 | HR 78 | Resp 12 | Ht 63.75 in | Wt 184.0 lb

## 2023-10-15 DIAGNOSIS — D0512 Intraductal carcinoma in situ of left breast: Secondary | ICD-10-CM

## 2023-10-15 DIAGNOSIS — Z124 Encounter for screening for malignant neoplasm of cervix: Secondary | ICD-10-CM | POA: Insufficient documentation

## 2023-10-15 DIAGNOSIS — Z30431 Encounter for routine checking of intrauterine contraceptive device: Secondary | ICD-10-CM | POA: Diagnosis not present

## 2023-10-15 DIAGNOSIS — Z01419 Encounter for gynecological examination (general) (routine) without abnormal findings: Secondary | ICD-10-CM

## 2023-10-15 NOTE — Progress Notes (Signed)
 Pamela Martinez [birth year] [id number]   History:  53 y.o. G2P0002 presents for annual exam. Mirena IUD 11/2021. 11/2021 hysteroscopy with D&C and polypectomy and IUD insertion. Oncology approved use of Mirena for AUB. Veozah for vasomotor symptoms. 2015 ASCUS positive HPV with negative C & B, 2019 ASCUS negative HPV. 11/2019 left DCIS managed with lumpectomy, radiation, and Tamoxifen.   Gynecologic History No LMP recorded. (Menstrual status: IUD).   Contraception/Family planning: IUD Sexually active: Yes  Health Maintenance Last Pap: 09/22/2020. Results were: Normal, 3-year repeat Last mammogram: 10/09/2023. Results were: Normal Last colonoscopy: 10/05/2021. Results were: Normal, 10-year recall Last Dexa: Never  Past medical history, past surgical history, family history and social history were all reviewed and documented in the EPIC chart. Married. 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 Pamela Martinez and Pamela Martinez.   ROS:  A ROS was performed and pertinent positives and negatives are included.  Exam:  Vitals:   10/15/23 0801  BP: 116/68  Pulse: 78  Resp: 12  Weight: 184 lb (83.5 kg)  Height: 5' 3.75" (1.619 m)      Body mass index is 31.83 kg/m.  General appearance:  Normal Thyroid:  Symmetrical, normal in size, without palpable masses or nodularity. Respiratory  Auscultation:  Clear without wheezing or rhonchi Cardiovascular  Auscultation:  Regular rate, without rubs, murmurs or gallops  Edema/varicosities:  Not grossly evident Abdominal  Soft,nontender, without masses, guarding or rebound.  Liver/spleen:  No organomegaly noted  Hernia:  None appreciated  Skin  Inspection:  Grossly normal   Breasts: Examined lying and sitting.   Right: Without masses, retractions, discharge or axillary adenopathy.   Left: Without masses, retractions, discharge or axillary adenopathy. Well-healed lumpectomy scar.  Pelvic: External genitalia:  no lesions               Urethra:  normal appearing urethra with no masses, tenderness or lesions              Bartholins and Skenes: normal                 Vagina: normal appearing vagina with normal color and discharge, no lesions              Cervix: no lesions. + IUD strings Bimanual Exam:  Uterus:  no masses or tenderness              Adnexa: no mass, fullness, tenderness              Rectovaginal: Deferred              Anus:  normal, no lesions  Patient informed chaperone available to be present for breast and pelvic exam. Patient has requested no chaperone to be present. Patient has been advised what will be completed during breast and pelvic exam.   Assessment/Plan:  53 y.o. G2P0002 for annual exam.   Well female exam with routine gynecological exam - Education provided on SBEs, importance of preventative screenings, current guidelines, high calcium diet, regular exercise, and multivitamin daily. Labs with PCP.  Encounter for routine checking of intrauterine contraceptive device (IUD) - Mirena IUD 11/2021. Approved by oncology for AUB. Amenorrheic. She is aware of 8-year FDA approval.   Ductal carcinoma in situ (DCIS) of left breast - 11/2019 managed with lumpectomy, radiation and Tamoxifen. UTD on mammogram.   Cervical cancer screening - Plan: Cytology - PAP( [facility]). 2015 ASCUS positive HPV with negative C & B,  2019 ASCUS negative HPV, normal 09/2020. Pap today per guidelines.   Screening for colon cancer - Colonoscopy 09/2021. Will repeat at 10-year interval per GI's recommended interval.   Screening for osteoporosis - Both grandmothers with history of osteoporosis. Recommend Vitamin D + calcium and regular exercise. Recommend DXA earlier than age 63.   Return in about 1 year (around 10/14/2024) for Annual.      Pamela Mackie DNP, 8:27 AM 10/15/2023

## 2023-10-16 LAB — CYTOLOGY - PAP
Comment: NEGATIVE
Diagnosis: NEGATIVE
High risk HPV: NEGATIVE

## 2023-10-17 ENCOUNTER — Encounter: Payer: Self-pay | Admitting: Nurse Practitioner

## 2023-12-16 ENCOUNTER — Other Ambulatory Visit: Payer: Self-pay | Admitting: Hematology and Oncology

## 2024-02-10 ENCOUNTER — Encounter: Payer: Self-pay | Admitting: Internal Medicine

## 2024-02-27 ENCOUNTER — Encounter: Payer: Self-pay | Admitting: Nurse Practitioner

## 2024-02-27 ENCOUNTER — Ambulatory Visit: Admitting: Nurse Practitioner

## 2024-02-27 VITALS — BP 128/68 | HR 72 | Ht 64.0 in | Wt 173.0 lb

## 2024-02-27 DIAGNOSIS — Z79810 Long term (current) use of selective estrogen receptor modulators (SERMs): Secondary | ICD-10-CM | POA: Diagnosis not present

## 2024-02-27 DIAGNOSIS — Z975 Presence of (intrauterine) contraceptive device: Secondary | ICD-10-CM | POA: Diagnosis not present

## 2024-02-27 DIAGNOSIS — N950 Postmenopausal bleeding: Secondary | ICD-10-CM | POA: Diagnosis not present

## 2024-02-27 NOTE — Progress Notes (Signed)
   Acute Office Visit  Subjective:    Patient ID: Pamela Martinez, female    DOB: [birth year], 54 y.o.   MRN: [id number]   HPI 53 y.o. presents today for vaginal bleeding. Had episode of bright red blood yesterday with wiping. Only brown spotting today. Mirena  placed 11/2021. Has not had bleeding in 2 years. IUD approved by oncology for AUB. 2023 FSH 50.  On Tamoxifen  since 2021.   No LMP recorded. (Menstrual status: IUD). Period Duration (Days): Has not bled in 2 years  Review of Systems  Constitutional: Negative.   Endocrine: Positive for heat intolerance.  Genitourinary:  Positive for vaginal bleeding.       Objective:    Physical Exam Constitutional:      Appearance: Normal appearance.  Genitourinary:    General: Normal vulva.     Vagina: Normal.     Cervix: Normal.     Comments: + IUD strings    BP 128/68 (BP Location: Left Arm, Patient Position: Sitting, Cuff Size: Normal)   Pulse 72   Ht 5' 4 (1.626 m)   Wt 173 lb (78.5 kg)   SpO2 99%   BMI 29.70 kg/m  Wt Readings from Last 3 Encounters:  02/27/24 173 lb (78.5 kg)  10/15/23 184 lb (83.5 kg)  07/22/23 190 lb 9.6 oz (86.5 kg)        Patient informed chaperone available to be present for breast and pelvic exam. Patient has requested no chaperone to be present. Patient has been advised what will be completed during breast and pelvic exam.   Assessment & Plan:   Problem List Items Addressed This Visit       Other   IUD (intrauterine device) in place   Other Visit Diagnoses       Postmenopausal bleeding    -  Primary   Relevant Orders   US  PELVIS TRANSVAGINAL NON-OB (TV ONLY)     Long-term current use of tamoxifen        Relevant Orders   US  PELVIS TRANSVAGINAL NON-OB (TV ONLY)      Plan: Normal IUD exam. Schedule ultrasound.      Pamela DELENA Shutter DNP, 1:53 PM 02/27/2024

## 2024-03-26 ENCOUNTER — Encounter: Payer: Self-pay | Admitting: Nurse Practitioner

## 2024-03-26 ENCOUNTER — Ambulatory Visit (INDEPENDENT_AMBULATORY_CARE_PROVIDER_SITE_OTHER): Admitting: Nurse Practitioner

## 2024-03-26 ENCOUNTER — Ambulatory Visit (INDEPENDENT_AMBULATORY_CARE_PROVIDER_SITE_OTHER)

## 2024-03-26 VITALS — BP 110/80 | HR 74 | Resp 16

## 2024-03-26 DIAGNOSIS — Z975 Presence of (intrauterine) contraceptive device: Secondary | ICD-10-CM | POA: Diagnosis not present

## 2024-03-26 DIAGNOSIS — R9389 Abnormal findings on diagnostic imaging of other specified body structures: Secondary | ICD-10-CM

## 2024-03-26 DIAGNOSIS — N950 Postmenopausal bleeding: Secondary | ICD-10-CM

## 2024-03-26 DIAGNOSIS — Z79810 Long term (current) use of selective estrogen receptor modulators (SERMs): Secondary | ICD-10-CM | POA: Diagnosis not present

## 2024-03-26 DIAGNOSIS — N912 Amenorrhea, unspecified: Secondary | ICD-10-CM

## 2024-03-26 NOTE — Progress Notes (Signed)
   Acute Office Visit  Subjective:    Patient ID: Pamela Martinez, female    DOB: Apr 28, 1971, 53 y.o.   MRN: [id number]   HPI 53 y.o. presents today for ultrasound. Seen 8/21 with complaints of episode of bright red blood the day before with wiping. Mirena  placed 11/2021. Has not had bleeding in 2 years. IUD approved by oncology for AUB. 2023 FSH 50. On Tamoxifen  since 2021.   No LMP recorded. (Menstrual status: IUD).    Review of Systems  Constitutional: Negative.   Genitourinary:  Positive for vaginal bleeding (Resolved).       Objective:    Physical Exam Constitutional:      Appearance: Normal appearance.   GU: Not indicated  BP 110/80   Pulse 74   Resp 16  Wt Readings from Last 3 Encounters:  02/27/24 173 lb (78.5 kg)  10/15/23 184 lb (83.5 kg)  07/22/23 190 lb 9.6 oz (86.5 kg)        Assessment & Plan:   Problem List Items Addressed This Visit       Other   IUD (intrauterine device) in place   Other Visit Diagnoses       PMB (postmenopausal bleeding)    -  Primary   Relevant Orders   Endometrial biopsy     Long-term current use of tamoxifen        Relevant Orders   Endometrial biopsy     Thickened endometrium       Relevant Orders   Endometrial biopsy     Amenorrhea       Relevant Orders   Estradiol    Follicle stimulating hormone      Vaginal ultrasound (comparison is made to 2023 ultrasound): Anteverted uterus, normal size and shape, subcentimeter intramural fibroid noted.  Endometrium - 4.4 mm.  No definite masses seen, avascular, IUD noted centrally within the EM canal.  Right ovary 59 x 49 mm avascular echogenic area still noted (stable).  Left ovary within normal limits.  No adnexal masses, no free fluid.  Plan: Discussed ultrasound and mildly thickened endometrium, as well as slight increased risk for uterine cancer with Tamoxifen  use (2-3 times higher than those not taking Tamoxifen ). Will schedule EMB with MD.     Pamela DELENA Shutter  DNP, 9:47 AM 03/26/2024

## 2024-03-27 ENCOUNTER — Ambulatory Visit: Payer: Self-pay | Admitting: Nurse Practitioner

## 2024-03-27 LAB — FOLLICLE STIMULATING HORMONE: FSH: 45.2 m[IU]/mL

## 2024-03-27 LAB — ESTRADIOL: Estradiol: <15 pg/mL

## 2024-04-01 ENCOUNTER — Emergency Department (HOSPITAL_COMMUNITY)
Admission: EM | Admit: 2024-04-01 | Discharge: 2024-04-01 | Disposition: A | Discharge status: Home / Self Care | Attending: Emergency Medicine | Admitting: Emergency Medicine

## 2024-04-01 ENCOUNTER — Encounter (HOSPITAL_COMMUNITY): Payer: Self-pay

## 2024-04-01 ENCOUNTER — Emergency Department (HOSPITAL_COMMUNITY)

## 2024-04-01 ENCOUNTER — Other Ambulatory Visit: Payer: Self-pay

## 2024-04-01 DIAGNOSIS — R5383 Other fatigue: Secondary | ICD-10-CM | POA: Insufficient documentation

## 2024-04-01 DIAGNOSIS — Z8616 Personal history of COVID-19: Secondary | ICD-10-CM | POA: Diagnosis not present

## 2024-04-01 DIAGNOSIS — R55 Syncope and collapse: Secondary | ICD-10-CM | POA: Diagnosis present

## 2024-04-01 DIAGNOSIS — J45909 Unspecified asthma, uncomplicated: Secondary | ICD-10-CM | POA: Diagnosis not present

## 2024-04-01 LAB — CBC
HCT: 42.8 % (ref 36.0–46.0)
Hemoglobin: 13.8 g/dL (ref 12.0–15.0)
MCH: 31.2 pg (ref 26.0–34.0)
MCHC: 32.2 g/dL (ref 30.0–36.0)
MCV: 96.6 fL (ref 80.0–100.0)
Platelets: 196 K/uL (ref 150–400)
RBC: 4.43 MIL/uL (ref 3.87–5.11)
RDW: 12.2 % (ref 11.5–15.5)
WBC: 5.1 K/uL (ref 4.0–10.5)
nRBC: 0 % (ref 0.0–0.2)

## 2024-04-01 LAB — URINALYSIS, ROUTINE W REFLEX MICROSCOPIC
Bilirubin Urine: NEGATIVE
Glucose, UA: NEGATIVE mg/dL
Hgb urine dipstick: NEGATIVE
Ketones, ur: NEGATIVE mg/dL
Leukocytes,Ua: NEGATIVE
Nitrite: NEGATIVE
Protein, ur: NEGATIVE mg/dL
Specific Gravity, Urine: 1.015 (ref 1.005–1.030)
pH: 7 (ref 5.0–8.0)

## 2024-04-01 LAB — COMPREHENSIVE METABOLIC PANEL WITH GFR
ALT: 23 U/L (ref 0–44)
AST: 19 U/L (ref 15–41)
Albumin: 3.9 g/dL (ref 3.5–5.0)
Alkaline Phosphatase: 81 U/L (ref 38–126)
Anion gap: 11 (ref 5–15)
BUN: 24 mg/dL — ABNORMAL HIGH (ref 6–20)
CO2: 21 mmol/L — ABNORMAL LOW (ref 22–32)
Calcium: 9.3 mg/dL (ref 8.9–10.3)
Chloride: 109 mmol/L (ref 98–111)
Creatinine, Ser: 0.89 mg/dL (ref 0.44–1.00)
GFR, Estimated: >60 mL/min (ref >60)
Glucose, Bld: 113 mg/dL — ABNORMAL HIGH (ref 70–99)
Potassium: 3.9 mmol/L (ref 3.5–5.1)
Sodium: 141 mmol/L (ref 135–145)
Total Bilirubin: 0.4 mg/dL (ref 0.0–1.2)
Total Protein: 5.9 g/dL — ABNORMAL LOW (ref 6.5–8.1)

## 2024-04-01 LAB — CBG MONITORING, ED: Glucose-Capillary: 111 mg/dL — ABNORMAL HIGH (ref 70–99)

## 2024-04-01 MED ORDER — LACTATED RINGERS IV BOLUS
2000.0000 mL | Freq: Once | INTRAVENOUS | Status: AC
  Administered 2024-04-01: 2000 mL via INTRAVENOUS

## 2024-04-01 NOTE — ED Triage Notes (Addendum)
 PT arrives via EMS. She reports she waiting to step on an elevator at work when she felt a sudden onset of nausea. She reports at that time, she felt like she was going pass out so she went and sat down on a bench. Pt reports she did pass out and woke up to several people standing around her. Pt arrives AxOx[facility]. Denies any injury from the syncopal episode. Patient's only complaint at this time is that she feels tired. Denies any complaints prior to passing out. She did start a new medication this morning, Vraylar.

## 2024-04-01 NOTE — ED Provider Notes (Signed)
 [place] [facility]ERGENCY DEPARTMENT AT [facility] Provider Note   CSN: [id number] Arrival date & time: 04/01/24  9053     Patient presents with: Loss of Consciousness and Fatigue   Pamela Martinez is a 53 y.o. female.   53 year old female presents today for concern of syncopal episode that occurred today.  She states that she was at work and when she got into an elevator suddenly she felt hot, nauseous, and lightheaded and then had the syncopal episode.  She states she did sit down so did not have any significant fall.  Denies any headache, nausea, or vomiting currently.  Currently she feels back to baseline.  No chest pain, shortness of breath, palpitations prior to the episode or currently.  She did start a new medication called Vraylar.  She was previously on a different medication but stopped it due to tardive dyskinesia.  She took this medication around 7 AM which was about an hour earlier from the episode.  The history is provided by the patient. No language interpreter was used.       Prior to Admission medications   Medication Sig Start Date End Date Taking? Authorizing Provider  cariprazine (VRAYLAR) 1.5 MG capsule Take 1.5 mg by mouth daily.   Yes [provider]  ALPRAZolam  (XANAX ) 0.5 MG tablet Take 0.5 mg by mouth at bedtime as needed for anxiety.    [provider]  buPROPion (WELLBUTRIN XL) 150 MG 24 hr tablet Take 150 mg by mouth daily. Patient not taking: Reported on 03/26/2024 02/10/24   [provider]  Cholecalciferol (VITAMIN D3 PO) Take by mouth.    [provider]  desvenlafaxine  (PRISTIQ ) 50 MG 24 hr tablet Take 1 tablet (50 mg total) by mouth daily. 07/22/23   Gudena, Vinay, MD  Fezolinetant  (VEOZAH ) 45 MG TABS Take 1 tablet (45 mg total) by mouth daily at 12 noon. 07/22/23   Gudena, Vinay, MD  ibuprofen (ADVIL) 200 MG tablet Take 200 mg by mouth every 6 (six) hours as needed.    [provider]  Lansoprazole  (PREVACID PO) Take by mouth as needed.    [provider]  levocetirizine (XYZAL ) 5 MG tablet     [provider]  tamoxifen  (NOLVADEX ) 10 MG tablet TAKE 1/2 TABLET TWICE A DAY BY MOUTH 12/16/23   Odean Potts, MD  levonorgestrel  (MIRENA ) 20 MCG/24HR IUD 1 each by Intrauterine route once. INSERTED 05-01-06.   09/02/17  [provider]    Allergies: Diflucan  [fluconazole ] and Sulfa antibiotics    Review of Systems  Constitutional:  Negative for chills and fever.  Respiratory:  Negative for shortness of breath.   Cardiovascular:  Negative for chest pain and palpitations.  Neurological:  Negative for light-headedness.  All other systems reviewed and are negative.   Updated Vital Signs BP 102/72   Pulse (!) 58   Temp 98.6 F (37 C)   Resp 17   SpO2 100%   Physical Exam Vitals and nursing note reviewed.  Constitutional:      General: She is not in acute distress.    Appearance: Normal appearance. She is not ill-appearing.  HENT:     Head: Normocephalic and atraumatic.     Nose: Nose normal.  Eyes:     Extraocular Movements: Extraocular movements intact.     Conjunctiva/sclera: Conjunctivae normal.  Cardiovascular:     Rate and Rhythm: Normal rate and regular rhythm.  Pulmonary:     Effort: Pulmonary effort is normal.  No respiratory distress.  Musculoskeletal:        General: No deformity. Normal range of motion.     Cervical back: Normal range of motion.  Skin:    Findings: No rash.  Neurological:     General: No focal deficit present.     Mental Status: She is alert and oriented to person, place, and time. Mental status is at baseline.     Cranial Nerves: No cranial nerve deficit.     Sensory: No sensory deficit.     Motor: No weakness.     (all labs ordered are listed, but only abnormal results are displayed) Labs Reviewed  COMPREHENSIVE METABOLIC PANEL WITH GFR - Abnormal; Notable for the following components:      Result Value   CO2 21  (*)    Glucose, Bld 113 (*)    BUN 24 (*)    Total Protein 5.9 (*)    All other components within normal limits  CBG MONITORING, ED - Abnormal; Notable for the following components:   Glucose-Capillary 111 (*)    All other components within normal limits  CBC  URINALYSIS, ROUTINE W REFLEX MICROSCOPIC    EKG: None  Radiology: CT Head Wo Contrast Result Date: 04/01/2024 EXAM: CT HEAD WITHOUT CONTRAST 04/01/2024 11:29:44 AM TECHNIQUE: CT of the head was performed without the administration of intravenous contrast. Automated exposure control, iterative reconstruction, and/or weight based adjustment of the mA/kV was utilized to reduce the radiation dose to as low as reasonably achievable. COMPARISON: MR head without and with contrast 12/28/2019 and CT head without contrast 09/17/2019. CLINICAL HISTORY: Polytrauma, blunt. The patient reports a sudden onset of nausea, feeling like she was going to pass out, and subsequently lost consciousness. She denies any injury from the syncopal episode and her only complaint is feeling tired. FINDINGS: BRAIN AND VENTRICLES: No acute hemorrhage. No evidence of acute infarct. No hydrocephalus. No extra-axial collection. No mass effect or midline shift. ORBITS: No acute abnormality. SINUSES: No acute abnormality. SOFT TISSUES AND SKULL: No acute soft tissue abnormality. No skull fracture. IMPRESSION: 1. No acute intracranial abnormality Electronically signed by: Lonni Necessary MD 04/01/2024 11:51 AM EDT RP Workstation: [id number]     Procedures   Medications Ordered in the ED  lactated ringers  bolus 2,000 mL (2,000 mLs Intravenous New Bag/Given 04/01/24 1210)                                    Medical Decision Making Amount and/or Complexity of Data Reviewed Labs: ordered. Radiology: ordered.   Medical Decision Making / ED Course   This patient presents to the ED for concern of syncopal episode, this involves an extensive number of treatment  options, and is a complaint that carries with it a high risk of complications and morbidity.  The differential diagnosis includes seizure, syncope, vasovagal episode, orthostasis, medication side effect  MDM: 53 year old female presents today for above-mentioned complaint. Currently she is at baseline. Hemodynamically she is stable. EKG is without any acute ischemic change.  CT head obtained and shows no acute intracranial finding. CBC unremarkable, UA without evidence of UTI.  CBG of 111.  CMP shows no acute finding. Telemetry without arrhythmia or other acute finding.  Her medication could be playing a component which she took about an hour before this episode occurred.  prodromal symptoms of lightheadedness, nausea, and being in an elevator which is reassuring and points towards a  benign cause.   Discussed talking to the prescriber about Vraylar.  Discussed she may need to be taking this at bedtime especially since this is a brand-new medication for her.  Fluids given.  Discussed increasing hydration. Discharged in stable condition.   Additional history obtained: -Additional history obtained from husband at bedside -External records from outside source obtained and reviewed including: Chart review including previous notes, labs, imaging, consultation notes   Lab Tests: -I ordered, reviewed, and interpreted labs.   The pertinent results include:   Labs Reviewed  COMPREHENSIVE METABOLIC PANEL WITH GFR - Abnormal; Notable for the following components:      Result Value   CO2 21 (*)    Glucose, Bld 113 (*)    BUN 24 (*)    Total Protein 5.9 (*)    All other components within normal limits  CBG MONITORING, ED - Abnormal; Notable for the following components:   Glucose-Capillary 111 (*)    All other components within normal limits  CBC  URINALYSIS, ROUTINE W REFLEX MICROSCOPIC      EKG  EKG Interpretation Date/Time:    Ventricular Rate:    PR Interval:    QRS Duration:     QT Interval:    QTC Calculation:   R Axis:      Text Interpretation:           Imaging Studies ordered: I ordered imaging studies including CT T head I independently visualized and interpreted imaging. 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 debera , note 11-18-2019 in epic showed Sinus w/ rare PAC/ PVC;   (11-23-2021 pt stated has not had near syncope or syncope since )   Migraine    Personal history of radiation therapy    left breast 03-07-2020  to  04-01-2020   PONV (postoperative nausea and vomiting)    Restless leg syndrome    Wears glasses       Dispostion: Discharged in stable condition.  Return precautions discussed.  Patient voices understanding and is in agreement with plan.   Final diagnoses:  Vasovagal syncope    ED Discharge Orders     None          Hildegard Loge, [place] 04/01/24 1325    Armenta Canning, MD 04/03/24 [phone]

## 2024-04-01 NOTE — Discharge Instructions (Addendum)
 No concerning findings on your workup to indicate concerning cause of your syncopal episode.  Drink more fluids.  Advise your prescriber of your new medicine know of this episode.  This may be a medicine that you may do well taken at bedtime as it can make you drowsy and increased risk of falls.  CT scan of the head did not show any concerning findings.

## 2024-04-13 ENCOUNTER — Ambulatory Visit: Admitting: Cardiology

## 2024-04-21 ENCOUNTER — Encounter: Payer: Self-pay | Admitting: Obstetrics and Gynecology

## 2024-04-21 ENCOUNTER — Ambulatory Visit: Admitting: Obstetrics and Gynecology

## 2024-04-21 VITALS — BP 98/56 | HR 76 | Temp 98.0°F | Ht 64.75 in | Wt 175.0 lb

## 2024-04-21 DIAGNOSIS — Z79810 Long term (current) use of selective estrogen receptor modulators (SERMs): Secondary | ICD-10-CM | POA: Diagnosis not present

## 2024-04-21 DIAGNOSIS — R9389 Abnormal findings on diagnostic imaging of other specified body structures: Secondary | ICD-10-CM | POA: Diagnosis not present

## 2024-04-21 DIAGNOSIS — N950 Postmenopausal bleeding: Secondary | ICD-10-CM

## 2024-04-21 DIAGNOSIS — Z01812 Encounter for preprocedural laboratory examination: Secondary | ICD-10-CM

## 2024-04-21 DIAGNOSIS — Z975 Presence of (intrauterine) contraceptive device: Secondary | ICD-10-CM

## 2024-04-21 LAB — PREGNANCY, URINE: Preg Test, Ur: NEGATIVE

## 2024-04-21 MED ORDER — LIDOCAINE HCL (PF) 1 % IJ SOLN
10.0000 mL | Freq: Once | INTRAMUSCULAR | Status: DC
Start: 2024-04-21 — End: 2024-06-02

## 2024-04-21 NOTE — Progress Notes (Signed)
 53 y.o. G72P2002 female with left breast cancer (diagnosed 2021, s/p lumpectomy, on tamoxifen ), Mirena  IUD (inserted 11/2021) postmenopausal bleeding, thickened endometrium here for EMB. Married.  No LMP recorded. (Menstrual status: IUD).   H/o hyst, D&C polypectomy 2023, benign pathology.  ET was 12 mm at that time.  Mirena  IUD was inserted for perimenopausal AUB.  Doing well today. No CP or SOB with walking or stairs  03/26/2024 FSH 45, estradiol  <15    Component Value Date/Time   DIAGPAP  10/15/2023 0825    - Negative for intraepithelial lesion or malignancy (NILM)   HPVHIGH Negative 10/15/2023 0825   ADEQPAP  10/15/2023 0825    Satisfactory for evaluation; transformation zone component PRESENT.   03/26/24 TVUS: Anteverted uterus, normal size and shape, subcentimeter intramural fibroid noted.  Endometrium - 4.4 mm.  No definite masses seen, avascular, IUD noted centrally within the EM canal.     Right ovary 59 x 49 mm avascular echogenic area still noted (stable). Left ovary within normal limits.     No adnexal masses, no free fluid.   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 debera ,  note 11-18-2019 in epic showed Sinus w/ rare PAC/ PVC;   (11-23-2021 pt stated has not had near syncope or syncope since )   Migraine    Personal history of radiation therapy    left breast 03-07-2020  to  04-01-2020   PONV (postoperative nausea and vomiting)    Restless leg syndrome    Wears glasses    Past Surgical History:  Procedure Laterality Date   ANTERIOR CRUCIATE LIGAMENT REPAIR Left 1990   BREAST EXCISIONAL BIOPSY     BREAST LUMPECTOMY     BREAST LUMPECTOMY WITH RADIOACTIVE SEED LOCALIZATION Bilateral 01/29/2020   Procedure: BILATERAL BREAST LUMPECTOMY WITH RADIOACTIVE SEED LOCALIZATION;  Surgeon: Curvin Deward MOULD, MD;  Location: [facility] OR;  Service: General;  Laterality: Bilateral;   COLONOSCOPY WITH PROPOFOL  N/A 10/05/2021   Procedure: COLONOSCOPY WITH PROPOFOL ;  Surgeon: Golda Claudis PENNER, MD;  Location: [facility];  Service: Endoscopy;  Laterality: N/A;  1245   DILATATION & CURETTAGE/HYSTEROSCOPY WITH MYOSURE N/A 11/27/2021   Procedure: DILATATION & CURETTAGE/HYSTEROSCOPY WITH MYOSURE;  Surgeon: Jannis Kate Norris, MD;  Location: [facility];  Service: Gynecology;  Laterality: N/A;   INTRAUTERINE DEVICE (IUD) INSERTION N/A 11/27/2021   Procedure: INTRAUTERINE DEVICE (IUD) INSERTION;  Surgeon: Jertson, Jill Evelyn, MD;  [facility]: [facility];  Service: Gynecology;  Laterality: N/A;   IUD REMOVAL N/A 11/27/2021   Procedure: INTRAUTERINE DEVICE (IUD) REMOVAL;  Surgeon: Jertson, Jill Evelyn, MD;  Location: [facility];  Service: Gynecology;  Laterality: N/A;   KNEE ARTHROSCOPY Bilateral    x3   1980s   WISDOM TOOTH EXTRACTION     Current Outpatient Medications on File Prior to Visit  Medication Sig Dispense Refill   ALPRAZolam  (XANAX ) 0.5 MG tablet Take 0.5 mg by mouth at bedtime as needed for anxiety.     cariprazine (VRAYLAR) 1.5 MG capsule Take 1.5 mg by mouth daily.     Cholecalciferol (VITAMIN D3 PO) Take by mouth.      desvenlafaxine  (PRISTIQ ) 50 MG 24 hr tablet Take 1 tablet (50 mg total) by mouth daily.     Fezolinetant  (VEOZAH ) 45 MG TABS Take 1 tablet (45 mg total) by mouth daily at 12 noon.     ibuprofen (ADVIL) 200 MG tablet Take 200 mg by mouth every 6 (six) hours as needed.     Lansoprazole (PREVACID PO) Take by mouth as needed.     levocetirizine (XYZAL ) 5 MG tablet      levonorgestrel  (MIRENA ) 20 MCG/24HR IUD 1 each by Intrauterine route once. INSERTED 05-01-06. 1 each    tamoxifen  (NOLVADEX ) 10 MG tablet TAKE 1/2 TABLET TWICE A DAY BY MOUTH 90 tablet 2   buPROPion (WELLBUTRIN XL) 150 MG 24 hr tablet Take 150 mg by mouth daily. (Patient not taking: Reported on 04/21/2024)     Current Facility-Administered Medications on File Prior to Visit  Medication Dose Route Frequency Provider Last Rate Last Admin   levonorgestrel  (MIRENA ) 20 MCG/24HR IUD   Intrauterine Once Fontaine, Evalene SQUIBB, MD       Allergies  Allergen Reactions   Diflucan  [Fluconazole ] Dermatitis    Causes welts   Sulfa Antibiotics Itching      PE Today's Vitals   04/21/24 0939  BP: (!) 98/56  Pulse: 76  Temp: 98 F (36.7 C)  TempSrc: Oral  SpO2: 97%  Weight: 175 lb (79.4 kg)  Height: 5' 4.75 (1.645 m)   Body mass index is 29.35 kg/m.  Physical Exam Vitals reviewed. Exam conducted with a chaperone present.  Constitutional:      General: She is not in acute distress.    Appearance: Normal appearance.  HENT:     Head: Normocephalic and atraumatic.     Nose: Nose normal.  Eyes:     Extraocular Movements: Extraocular movements intact.     Conjunctiva/sclera: Conjunctivae normal.  Cardiovascular:     Rate and Rhythm: Normal rate and regular rhythm.     Heart sounds: No murmur heard.    No friction rub. No gallop.  Pulmonary:     Effort: Pulmonary effort is normal. No respiratory distress.     Breath sounds: Normal breath sounds. No stridor. No wheezing, rhonchi or rales.  Genitourinary:    General: Normal  vulva.     Exam position: Lithotomy position.     Vagina: Normal. No vaginal discharge.     Cervix: Normal. No cervical motion tenderness, discharge or lesion.     Uterus: Normal. Not enlarged and not tender.      Adnexa: Right adnexa normal and left adnexa normal.     Comments: IUD strings present. Musculoskeletal:        General: Normal range of motion.     Cervical back: Normal range of motion.  Neurological:     General: No  focal deficit present.     Mental Status: She is alert.  Psychiatric:        Mood and Affect: Mood normal.        Behavior: Behavior normal.    Procedure Consent was signed. Timeout was performed. Speculum inserted into the vagina, cervix visualized and was prepped with Betadine.  Cervical block was performed with 10cc 1% lidocaine . A single-toothed tenaculum was placed on the anterior lip of the cervix to stabilize it.  Unable to pass cannula or os finder. Procedure was discontinued at this time    Assessment and Plan:        PMB (postmenopausal bleeding) Thickened endometrium IUD (intrauterine device) in place Long-term current use of tamoxifen  Assessment & Plan: Patient referred for postmenopausal bleeding and thickened endometrium.  She has an increased risk for endometrial hyperplasia and endometrial polyps due to tamoxifen . H/o hyst, D&C polypectomy 2023, benign pathology.  ET was 12 mm at that time. Mirena  IUD was placed at that time. Endometrium is now 4.4 mm. Attempted endometrial biopsy without success due to cervical stenosis. Patient offered repeat attempt in the office versus diagnostic hysteroscopy and D&C.  She opted for the latter.  Plan for diagnostic hysteroscopy, dilation and curettage.  Discussed outpatient procedure. Reviewed that  recovery is usually 1-2 days. Risks including infections, bleeding, and damage to surrounding organs reviewed. Recommend NPO prior to midnight and reviewed medication to take on day of surgery.  Dicussed use of NSAIDS as needed for pain postoperatively.  Preop checklist: Antibiotics: none DVT ppx: SCDs Postop visit: 1 week Additional clearance: none    Orders: -     Ambulatory Referral For Surgery Scheduling  Pre-procedure lab exam -     Pregnancy, urine  Other orders -     Lidocaine  HCl (PF)    Vera LULLA Pa, MD

## 2024-04-21 NOTE — Patient Instructions (Signed)
 Preoperative instructions: Nothing to eat or drink after midnight, unless instructed differently regarding clear liquids by the anesthesia team at [facility] health. Do not take any medications on the day of surgery, except those listed below: NONE Please follow all other instructions as provided by our surgical scheduler at [facility] and the anesthesia team at [facility] health.  Postoperative instructions: Take ibuprofen  as prescribed and over-the-counter Tylenol  as needed. (DO NOT TAKE IBUPROFEN  IF YOU HAVE CONTRAINDICATIONS THAT MAY INCLUDE USE OF BLOOD THINNERS, HISTORY STOMACH SURGERY OR GASTRITIS, CHRONIC KIDNEY DISEASE, ALLERGY, PRIOR INSTRUCTION TO AVOID IBUPROFEN -LIKE MEDICATIONS.)

## 2024-04-24 ENCOUNTER — Telehealth: Payer: Self-pay | Admitting: *Deleted

## 2024-04-24 DIAGNOSIS — N950 Postmenopausal bleeding: Secondary | ICD-10-CM | POA: Insufficient documentation

## 2024-04-24 DIAGNOSIS — Z79810 Long term (current) use of selective estrogen receptor modulators (SERMs): Secondary | ICD-10-CM | POA: Insufficient documentation

## 2024-04-24 DIAGNOSIS — R9389 Abnormal findings on diagnostic imaging of other specified body structures: Secondary | ICD-10-CM | POA: Insufficient documentation

## 2024-04-24 NOTE — Assessment & Plan Note (Signed)
 Patient referred for postmenopausal bleeding and thickened endometrium.  She has an increased risk for endometrial hyperplasia and endometrial polyps due to tamoxifen . H/o hyst, D&C polypectomy 2023, benign pathology.  ET was 12 mm at that time. Mirena  IUD was placed at that time. Endometrium is now 4.4 mm. Attempted endometrial biopsy without success due to cervical stenosis. Patient offered repeat attempt in the office versus diagnostic hysteroscopy and D&C.  She opted for the latter.  Plan for diagnostic hysteroscopy, dilation and curettage.  Discussed outpatient procedure. Reviewed that  recovery is usually 1-2 days. Risks including infections, bleeding, and damage to surrounding organs reviewed. Recommend NPO prior to midnight and reviewed medication to take on day of surgery. Dicussed use of NSAIDS as needed for pain postoperatively.  Preop checklist: Antibiotics: none DVT ppx: SCDs Postop visit: 1 week Additional clearance: none

## 2024-04-24 NOTE — Telephone Encounter (Signed)
 Call returned to patient. Acknowledged surgery referral received, process reviewed, see surgery referral.   Patient states she is unable to see AVS, advised I will notify Dr. Dallie, once chart signed you can view. Advised Dr. Dallie is in the OR, response may not be immediate. Patient agreeable.  Patient denies any concerns.   Dr. Dallie -please advise once chart signed.

## 2024-05-04 ENCOUNTER — Ambulatory Visit: Attending: Cardiology | Admitting: Cardiology

## 2024-05-04 ENCOUNTER — Encounter: Payer: Self-pay | Admitting: Cardiology

## 2024-05-04 VITALS — BP 110/70 | HR 88 | Ht 64.0 in | Wt 179.0 lb

## 2024-05-04 DIAGNOSIS — R55 Syncope and collapse: Secondary | ICD-10-CM

## 2024-05-04 NOTE — Progress Notes (Signed)
    Cardiology Office Note  Date: 05/04/2024   ID: Pamela Martinez, DOB 1971-07-07, MRN [id number]  History of Present Illness: Pamela Martinez is a 53 y.o. female last seen in July 2024.  She is here for a follow-up visit.  She had an ER visit back in late September after an episode of vasovagal syncope based on description.  She states states that she was feeling poorly about an hour before the episode occurred, was standing at an elevator when she realized she had to sit down.  Only new medication prior to that was Vraylar which she is tolerating subsequently without recurrent episodes.  She has been hydrating more vigorously since September.  I reviewed her ECG and lab work.  I went over the remainder of her medications as well.  Blood pressure is normal today.  Physical Exam: VS:  BP 110/70 (BP Location: Left Arm, Patient Position: Sitting, Cuff Size: Normal)   Pulse 88   Ht 5' 4 (1.626 m)   Wt 179 lb (81.2 kg)   SpO2 96%   BMI 30.73 kg/m , BMI Body mass index is 30.73 kg/m.  Wt Readings from Last 3 Encounters:  05/04/24 179 lb (81.2 kg)  04/21/24 175 lb (79.4 kg)  02/27/24 173 lb (78.5 kg)    General: Patient appears comfortable at rest. HEENT: Conjunctiva and lids normal. Lungs: Clear to auscultation, nonlabored breathing at rest. Cardiac: Regular rate and rhythm, no S3 or significant systolic murmur, no pericardial rub.  ECG:  An ECG dated 04/01/2024 was personally reviewed today and demonstrated:  Sinus bradycardia.  Labwork: 04/01/2024: ALT 23; AST 19; BUN 24; Creatinine, Ser 0.89; Hemoglobin 13.8; Platelets 196; Potassium 3.9; Sodium 141     Component Value Date/Time   CHOL 193 09/22/2020 1422   TRIG 133 09/22/2020 1422   HDL 43 (L) 09/22/2020 1422   CHOLHDL 4.5 09/22/2020 1422   VLDL 20 08/24/2016 1527   LDLCALC 125 (H) 09/22/2020 1422   Other Studies Reviewed Today:  No interval cardiac testing for review today.  Assessment and Plan:  1.   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  Echocardiogram in January 2024 revealed LVEF 60 to 65% and cardiac monitor at that time showed no sustained arrhythmias (rare atrial and ventricular ectopy as well as a single 7 beat run of SVT).  She had a recurrent episode in September as discussed above.  Agree with efforts at hydration, we discussed attaining seated or ideally supine position when symptoms recur.  She has not had an accelerating pattern of events, would continue with conservative measures for now.   2.  Coronary calcium score of 0 by CT in February 2024.  Disposition:  Follow up 1 year.  Signed, Jayson JUDITHANN Sierras, M.D., F.A.C.C. [facility] at [facility]

## 2024-05-04 NOTE — Patient Instructions (Addendum)

## 2024-05-20 ENCOUNTER — Encounter: Payer: Self-pay | Admitting: *Deleted

## 2024-05-25 ENCOUNTER — Encounter: Admitting: Obstetrics and Gynecology

## 2024-05-27 ENCOUNTER — Encounter (HOSPITAL_COMMUNITY): Payer: Self-pay | Admitting: Obstetrics and Gynecology

## 2024-05-27 NOTE — Progress Notes (Signed)
 Spoke w/ via phone for pre-op interview--- [facility] Lab needs dos---- UPT per surgeon.        Lab results------Current EKG in Epic dated 04/01/24 COVID test -----patient states asymptomatic no test needed Arrive at -------1400 NPO after MN NO Solid Food.  Clear liquids from MN until---1300 Pre-Surgery Ensure or G2:  Med rec completed Medications to take morning of surgery -----Wellbutrin, Vraylar, Pristiq  and Prevacid  Diabetic medication -----  GLP1 agonist last dose: GLP1 instructions:  Patient instructed no nail polish to be worn day of surgery Patient instructed to bring photo id and insurance card day of surgery Patient aware to have Driver (ride ) / caregiver    for 24 hours after surgery - Husband Pamela Martinez Nurse, Learning Disability Patient Special Instructions ----- Pre-Op special Instructions -----  Patient verbalized understanding of instructions that were given at this phone interview. Patient denies chest pain, sob, fever, cough at the interview.

## 2024-05-28 ENCOUNTER — Encounter: Payer: Self-pay | Admitting: Obstetrics and Gynecology

## 2024-05-28 ENCOUNTER — Ambulatory Visit (INDEPENDENT_AMBULATORY_CARE_PROVIDER_SITE_OTHER): Admitting: Obstetrics and Gynecology

## 2024-05-28 VITALS — BP 104/62 | HR 83 | Temp 98.1°F | Ht 64.75 in | Wt 180.0 lb

## 2024-05-28 DIAGNOSIS — N950 Postmenopausal bleeding: Secondary | ICD-10-CM

## 2024-05-28 DIAGNOSIS — R9389 Abnormal findings on diagnostic imaging of other specified body structures: Secondary | ICD-10-CM | POA: Diagnosis not present

## 2024-05-28 DIAGNOSIS — Z975 Presence of (intrauterine) contraceptive device: Secondary | ICD-10-CM

## 2024-05-28 DIAGNOSIS — Z01818 Encounter for other preprocedural examination: Secondary | ICD-10-CM | POA: Diagnosis not present

## 2024-05-28 DIAGNOSIS — Z79810 Long term (current) use of selective estrogen receptor modulators (SERMs): Secondary | ICD-10-CM

## 2024-05-28 NOTE — Progress Notes (Addendum)
 53 y.o. G40P2002 female with left breast cancer (diagnosed 2021, s/p lumpectomy, on tamoxifen ), Mirena  IUD (inserted 11/2021) postmenopausal bleeding, thickened endometrium here for preop exam. Married.  No LMP recorded. (Menstrual status: IUD).   H/o hyst, D&C polypectomy 2023, benign pathology.  ET was 12 mm at that time.  Mirena  IUD was inserted for perimenopausal AUB.  Doing well today. No changes since last visit. No CP or SOB with walking or stairs  03/26/2024 FSH 45, estradiol  <15    Component Value Date/Time   DIAGPAP  10/15/2023 0825    - Negative for intraepithelial lesion or malignancy (NILM)   HPVHIGH Negative 10/15/2023 0825   ADEQPAP  10/15/2023 0825    Satisfactory for evaluation; transformation zone component PRESENT.   03/26/24 TVUS: Anteverted uterus, normal size and shape, subcentimeter intramural fibroid noted.  Endometrium - 4.4 mm.  No definite masses seen, avascular, IUD noted centrally within the EM canal.     Right ovary 59 x 49 mm avascular echogenic area still noted (stable). Left ovary within normal limits.     No adnexal masses, no free fluid.   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 debera , note 11-18-2019 in epic showed Sinus w/ rare PAC/ PVC;   (11-23-2021 pt stated has not had near syncope or syncope since )   Migraine    Personal history of radiation therapy    left breast 03-07-2020  to  04-01-2020   PONV (postoperative nausea and vomiting)    Restless leg syndrome    Wears glasses    Past Surgical History:  Procedure Laterality Date   ANTERIOR CRUCIATE LIGAMENT REPAIR Left 1990   BREAST EXCISIONAL BIOPSY     BREAST LUMPECTOMY     BREAST LUMPECTOMY WITH RADIOACTIVE SEED LOCALIZATION Bilateral 01/29/2020   Procedure: BILATERAL BREAST LUMPECTOMY WITH RADIOACTIVE SEED LOCALIZATION;  Surgeon: Curvin Deward MOULD, MD;  Location: [facility] OR;  Service: General;  Laterality: Bilateral;   COLONOSCOPY WITH PROPOFOL  N/A 10/05/2021   Procedure: COLONOSCOPY WITH PROPOFOL ;  Surgeon: Golda Claudis PENNER, MD;  Location: [facility];  Service: Endoscopy;  Laterality: N/A;  1245   DILATATION & CURETTAGE/HYSTEROSCOPY WITH MYOSURE N/A 11/27/2021   Procedure: DILATATION & CURETTAGE/HYSTEROSCOPY WITH MYOSURE;  Surgeon: Jannis Kate Norris, MD;  Location: [facility] [place];  Service: Gynecology;  Laterality: N/A;   INTRAUTERINE DEVICE (IUD) INSERTION N/A 11/27/2021   Procedure: INTRAUTERINE DEVICE (IUD) INSERTION;  Surgeon: Jertson, Jill Evelyn, MD;  Location: [facility];  Service: Gynecology;  Laterality: N/A;   IUD REMOVAL N/A 11/27/2021   Procedure: INTRAUTERINE DEVICE (IUD) REMOVAL;  Surgeon: Jertson, Jill Evelyn, MD;  Location: [facility];  Service: Gynecology;  Laterality: N/A;   KNEE ARTHROSCOPY Bilateral    x3   1980s   WISDOM TOOTH EXTRACTION     Current Outpatient Medications on File Prior to Visit  Medication Sig Dispense Refill   ALPRAZolam  (XANAX ) 0.5 MG tablet Take 0.5 mg by mouth at bedtime as needed for anxiety.     Cholecalciferol (VITAMIN D3 PO) Take by mouth.     desvenlafaxine  (PRISTIQ ) 50 MG 24 hr  tablet Take 1 tablet (50 mg total) by mouth daily.     Fezolinetant  (VEOZAH ) 45 MG TABS Take 1 tablet (45 mg total) by mouth daily at 12 noon.     ibuprofen (ADVIL) 200 MG tablet Take 200 mg by mouth every 6 (six) hours as needed.     Lansoprazole (PREVACID PO) Take by mouth as needed.     levocetirizine (XYZAL ) 5 MG tablet      levonorgestrel  (MIRENA ) 20 MCG/24HR IUD 1 each by Intrauterine route once. INSERTED 05-01-06. 1 each    tamoxifen  (NOLVADEX ) 10 MG tablet TAKE 1/2 TABLET TWICE A DAY BY MOUTH 90 tablet 2   Current Facility-Administered Medications on File Prior to Visit  Medication Dose Route Frequency Provider Last Rate Last Admin   levonorgestrel  (MIRENA ) 20 MCG/24HR IUD   Intrauterine Once Fontaine, Evalene SQUIBB, MD       lidocaine  (PF) (XYLOCAINE ) 1 % injection 10 mL  10 mL Infiltration Once        Allergies  Allergen Reactions   Diflucan  [Fluconazole ] Dermatitis    Causes welts   Sulfa Antibiotics Itching      PE Today's Vitals   05/28/24 1530  BP: 104/62  Pulse: 83  Temp: 98.1 F (36.7 C)  TempSrc: Oral  SpO2: 96%  Weight: 180 lb (81.6 kg)  Height: 5' 4.75 (1.645 m)    Body mass index is 30.19 kg/m.  Physical Exam Vitals reviewed.  Constitutional:      General: She is not in acute distress.    Appearance: Normal appearance.  HENT:     Head: Normocephalic and atraumatic.     Nose: Nose normal.  Eyes:     Extraocular Movements: Extraocular movements intact.     Conjunctiva/sclera: Conjunctivae normal.  Cardiovascular:     Rate and Rhythm: Normal rate and regular rhythm.     Heart sounds: No murmur heard.    No friction rub. No gallop.  Pulmonary:     Effort: Pulmonary effort is normal. No respiratory distress.     Breath sounds: Normal breath sounds. No stridor. No wheezing, rhonchi or rales.  Musculoskeletal:        General: Normal range of motion.     Cervical back: Normal range of motion.  Neurological:     General: No focal deficit present.      Mental Status: She is alert.  Psychiatric:        Mood and Affect: Mood normal.        Behavior: Behavior normal.       Assessment and Plan:        PMB (postmenopausal bleeding) Thickened endometrium IUD (intrauterine device) in place Long-term current use of tamoxifen  Assessment & Plan: Patient referred for postmenopausal bleeding and thickened endometrium.  She has an increased risk for endometrial hyperplasia and  endometrial polyps due to tamoxifen . H/o hyst, D&C polypectomy 2023, benign pathology.  ET was 12 mm at that time. Mirena  IUD was placed at that time. Endometrium is now 4.4 mm. Failed endometrial biopsy due to cervical stenosis.  Plan for diagnostic hysteroscopy, dilation and curettage scheduled 06/02/24.  Discussed outpatient procedure. Reviewed that  recovery is usually 1-2 days. Risks including infections, bleeding, and damage to surrounding organs reviewed. Recommend NPO prior to midnight and reviewed medication to take on day of surgery. Dicussed use of NSAIDS as needed for pain postoperatively.  Preop checklist: Antibiotics: none DVT ppx: SCDs Postop visit: 1 week Additional clearance: none    Orders: -     Ambulatory Referral For Surgery Scheduling  15 min  total time was spent for this patient encounter, including preparation, face-to-face counseling with the patient, coordination of care, and documentation of the encounter.    Vera LULLA Pa, MD

## 2024-05-28 NOTE — Patient Instructions (Signed)
 Preoperative instructions: Nothing to eat or drink after midnight, unless instructed differently regarding clear liquids by the anesthesia team at [facility] health. Do not take any medications on the day of surgery, except those listed below: NONE Please follow all other instructions as provided by our surgical scheduler at [facility] and the anesthesia team at [facility] health.  Postoperative instructions: Take ibuprofen  as prescribed and over-the-counter Tylenol  as needed. (DO NOT TAKE IBUPROFEN  IF YOU HAVE CONTRAINDICATIONS THAT MAY INCLUDE USE OF BLOOD THINNERS, HISTORY STOMACH SURGERY OR GASTRITIS, CHRONIC KIDNEY DISEASE, ALLERGY, PRIOR INSTRUCTION TO AVOID IBUPROFEN -LIKE MEDICATIONS.)

## 2024-05-28 NOTE — H&P (View-Only) (Signed)
 53 y.o. G40P2002 female with left breast cancer (diagnosed 2021, s/p lumpectomy, on tamoxifen ), Mirena  IUD (inserted 11/2021) postmenopausal bleeding, thickened endometrium here for preop exam. Married.  No LMP recorded. (Menstrual status: IUD).   H/o hyst, D&C polypectomy 2023, benign pathology.  ET was 12 mm at that time.  Mirena  IUD was inserted for perimenopausal AUB.  Doing well today. No changes since last visit. No CP or SOB with walking or stairs  03/26/2024 FSH 45, estradiol  <15    Component Value Date/Time   DIAGPAP  10/15/2023 0825    - Negative for intraepithelial lesion or malignancy (NILM)   HPVHIGH Negative 10/15/2023 0825   ADEQPAP  10/15/2023 0825    Satisfactory for evaluation; transformation zone component PRESENT.   03/26/24 TVUS: Anteverted uterus, normal size and shape, subcentimeter intramural fibroid noted.  Endometrium - 4.4 mm.  No definite masses seen, avascular, IUD noted centrally within the EM canal.     Right ovary 59 x 49 mm avascular echogenic area still noted (stable). Left ovary within normal limits.     No adnexal masses, no free fluid.   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 debera , note 11-18-2019 in epic showed Sinus w/ rare PAC/ PVC;   (11-23-2021 pt stated has not had near syncope or syncope since )   Migraine    Personal history of radiation therapy    left breast 03-07-2020  to  04-01-2020   PONV (postoperative nausea and vomiting)    Restless leg syndrome    Wears glasses    Past Surgical History:  Procedure Laterality Date   ANTERIOR CRUCIATE LIGAMENT REPAIR Left 1990   BREAST EXCISIONAL BIOPSY     BREAST LUMPECTOMY     BREAST LUMPECTOMY WITH RADIOACTIVE SEED LOCALIZATION Bilateral 01/29/2020   Procedure: BILATERAL BREAST LUMPECTOMY WITH RADIOACTIVE SEED LOCALIZATION;  Surgeon: Curvin Deward MOULD, MD;  Location: [facility] OR;  Service: General;  Laterality: Bilateral;   COLONOSCOPY WITH PROPOFOL  N/A 10/05/2021   Procedure: COLONOSCOPY WITH PROPOFOL ;  Surgeon: Golda Claudis PENNER, MD;  Location: [facility];  Service: Endoscopy;  Laterality: N/A;  1245   DILATATION & CURETTAGE/HYSTEROSCOPY WITH MYOSURE N/A 11/27/2021   Procedure: DILATATION & CURETTAGE/HYSTEROSCOPY WITH MYOSURE;  Surgeon: Jannis Kate Norris, MD;  Location: [facility] [place];  Service: Gynecology;  Laterality: N/A;   INTRAUTERINE DEVICE (IUD) INSERTION N/A 11/27/2021   Procedure: INTRAUTERINE DEVICE (IUD) INSERTION;  Surgeon: Jertson, Jill Evelyn, MD;  Location: [facility];  Service: Gynecology;  Laterality: N/A;   IUD REMOVAL N/A 11/27/2021   Procedure: INTRAUTERINE DEVICE (IUD) REMOVAL;  Surgeon: Jertson, Jill Evelyn, MD;  Location: [facility];  Service: Gynecology;  Laterality: N/A;   KNEE ARTHROSCOPY Bilateral    x3   1980s   WISDOM TOOTH EXTRACTION     Current Outpatient Medications on File Prior to Visit  Medication Sig Dispense Refill   ALPRAZolam  (XANAX ) 0.5 MG tablet Take 0.5 mg by mouth at bedtime as needed for anxiety.     Cholecalciferol (VITAMIN D3 PO) Take by mouth.     desvenlafaxine  (PRISTIQ ) 50 MG 24 hr  tablet Take 1 tablet (50 mg total) by mouth daily.     Fezolinetant  (VEOZAH ) 45 MG TABS Take 1 tablet (45 mg total) by mouth daily at 12 noon.     ibuprofen (ADVIL) 200 MG tablet Take 200 mg by mouth every 6 (six) hours as needed.     Lansoprazole (PREVACID PO) Take by mouth as needed.     levocetirizine (XYZAL ) 5 MG tablet      levonorgestrel  (MIRENA ) 20 MCG/24HR IUD 1 each by Intrauterine route once. INSERTED 05-01-06. 1 each    tamoxifen  (NOLVADEX ) 10 MG tablet TAKE 1/2 TABLET TWICE A DAY BY MOUTH 90 tablet 2   Current Facility-Administered Medications on File Prior to Visit  Medication Dose Route Frequency Provider Last Rate Last Admin   levonorgestrel  (MIRENA ) 20 MCG/24HR IUD   Intrauterine Once Fontaine, Evalene SQUIBB, MD       lidocaine  (PF) (XYLOCAINE ) 1 % injection 10 mL  10 mL Infiltration Once        Allergies  Allergen Reactions   Diflucan  [Fluconazole ] Dermatitis    Causes welts   Sulfa Antibiotics Itching      PE Today's Vitals   05/28/24 1530  BP: 104/62  Pulse: 83  Temp: 98.1 F (36.7 C)  TempSrc: Oral  SpO2: 96%  Weight: 180 lb (81.6 kg)  Height: 5' 4.75 (1.645 m)    Body mass index is 30.19 kg/m.  Physical Exam Vitals reviewed.  Constitutional:      General: She is not in acute distress.    Appearance: Normal appearance.  HENT:     Head: Normocephalic and atraumatic.     Nose: Nose normal.  Eyes:     Extraocular Movements: Extraocular movements intact.     Conjunctiva/sclera: Conjunctivae normal.  Cardiovascular:     Rate and Rhythm: Normal rate and regular rhythm.     Heart sounds: No murmur heard.    No friction rub. No gallop.  Pulmonary:     Effort: Pulmonary effort is normal. No respiratory distress.     Breath sounds: Normal breath sounds. No stridor. No wheezing, rhonchi or rales.  Musculoskeletal:        General: Normal range of motion.     Cervical back: Normal range of motion.  Neurological:     General: No focal deficit present.      Mental Status: She is alert.  Psychiatric:        Mood and Affect: Mood normal.        Behavior: Behavior normal.       Assessment and Plan:        PMB (postmenopausal bleeding) Thickened endometrium IUD (intrauterine device) in place Long-term current use of tamoxifen  Assessment & Plan: Patient referred for postmenopausal bleeding and thickened endometrium.  She has an increased risk for endometrial hyperplasia and  endometrial polyps due to tamoxifen . H/o hyst, D&C polypectomy 2023, benign pathology.  ET was 12 mm at that time. Mirena  IUD was placed at that time. Endometrium is now 4.4 mm. Failed endometrial biopsy due to cervical stenosis.  Plan for diagnostic hysteroscopy, dilation and curettage scheduled 06/02/24.  Discussed outpatient procedure. Reviewed that  recovery is usually 1-2 days. Risks including infections, bleeding, and damage to surrounding organs reviewed. Recommend NPO prior to midnight and reviewed medication to take on day of surgery. Dicussed use of NSAIDS as needed for pain postoperatively.  Preop checklist: Antibiotics: none DVT ppx: SCDs Postop visit: 1 week Additional clearance: none    Orders: -     Ambulatory Referral For Surgery Scheduling  15 min  total time was spent for this patient encounter, including preparation, face-to-face counseling with the patient, coordination of care, and documentation of the encounter.    Vera LULLA Pa, MD

## 2024-05-29 NOTE — Addendum Note (Signed)
 Addended by: DALLIE BOLLARD V on: 05/29/2024 10:23 AM   Modules accepted: Level of Service

## 2024-06-02 ENCOUNTER — Ambulatory Visit (HOSPITAL_COMMUNITY)
Admission: RE | Admit: 2024-06-02 | Discharge: 2024-06-02 | Disposition: A | Discharge status: Home / Self Care | Attending: Obstetrics and Gynecology | Admitting: Obstetrics and Gynecology

## 2024-06-02 ENCOUNTER — Ambulatory Visit (HOSPITAL_COMMUNITY): Admitting: Anesthesiology

## 2024-06-02 ENCOUNTER — Encounter (HOSPITAL_COMMUNITY): Payer: Self-pay | Admitting: Obstetrics and Gynecology

## 2024-06-02 ENCOUNTER — Other Ambulatory Visit: Payer: Self-pay

## 2024-06-02 ENCOUNTER — Encounter (HOSPITAL_COMMUNITY)
Admission: RE | Disposition: A | Discharge status: Home / Self Care | Payer: Self-pay | Source: Home / Self Care | Attending: Obstetrics and Gynecology

## 2024-06-02 DIAGNOSIS — Z86000 Personal history of in-situ neoplasm of breast: Secondary | ICD-10-CM | POA: Insufficient documentation

## 2024-06-02 DIAGNOSIS — F419 Anxiety disorder, unspecified: Secondary | ICD-10-CM | POA: Diagnosis not present

## 2024-06-02 DIAGNOSIS — Z79810 Long term (current) use of selective estrogen receptor modulators (SERMs): Secondary | ICD-10-CM | POA: Insufficient documentation

## 2024-06-02 DIAGNOSIS — J45909 Unspecified asthma, uncomplicated: Secondary | ICD-10-CM | POA: Diagnosis not present

## 2024-06-02 DIAGNOSIS — R9389 Abnormal findings on diagnostic imaging of other specified body structures: Secondary | ICD-10-CM | POA: Diagnosis present

## 2024-06-02 DIAGNOSIS — N950 Postmenopausal bleeding: Secondary | ICD-10-CM

## 2024-06-02 DIAGNOSIS — N840 Polyp of corpus uteri: Secondary | ICD-10-CM | POA: Insufficient documentation

## 2024-06-02 DIAGNOSIS — Z87891 Personal history of nicotine dependence: Secondary | ICD-10-CM | POA: Diagnosis not present

## 2024-06-02 DIAGNOSIS — Z79899 Other long term (current) drug therapy: Secondary | ICD-10-CM | POA: Insufficient documentation

## 2024-06-02 DIAGNOSIS — R519 Headache, unspecified: Secondary | ICD-10-CM | POA: Insufficient documentation

## 2024-06-02 HISTORY — PX: DILATATION & CURRETTAGE/HYSTEROSCOPY WITH RESECTOCOPE: SHX5572

## 2024-06-02 HISTORY — PX: MYOSURE RESECTION: SHX7611

## 2024-06-02 HISTORY — PX: IUD REMOVAL: SHX5392

## 2024-06-02 HISTORY — PX: INTRAUTERINE DEVICE (IUD) INSERTION: SHX5877

## 2024-06-02 LAB — POCT PREGNANCY, URINE: Preg Test, Ur: NEGATIVE

## 2024-06-02 SURGERY — DILATATION & CURETTAGE/HYSTEROSCOPY WITH RESECTOCOPE
Anesthesia: General | Site: Vagina

## 2024-06-02 MED ORDER — LEVONORGESTREL 20 MCG/DAY IU IUD
1.0000 | INTRAUTERINE_SYSTEM | INTRAUTERINE | Status: AC
  Administered 2024-06-02: 1 via INTRAUTERINE
  Filled 2024-06-02: qty 1

## 2024-06-02 MED ORDER — SODIUM CHLORIDE 0.9 % IR SOLN
Status: DC | PRN
  Administered 2024-06-02 (×2): 3000 mL

## 2024-06-02 MED ORDER — MIDAZOLAM HCL (PF) 2 MG/2ML IJ SOLN
INTRAMUSCULAR | Status: DC | PRN
  Administered 2024-06-02: 2 mg via INTRAVENOUS

## 2024-06-02 MED ORDER — ONDANSETRON HCL 4 MG/2ML IJ SOLN
INTRAMUSCULAR | Status: DC | PRN
Start: 2024-06-02 — End: 2024-06-02
  Administered 2024-06-02: 4 mg via INTRAVENOUS

## 2024-06-02 MED ORDER — PROPOFOL 10 MG/ML IV BOLUS
INTRAVENOUS | Status: AC
  Filled 2024-06-02: qty 20

## 2024-06-02 MED ORDER — LEVONORGESTREL 20 MCG/DAY IU IUD
INTRAUTERINE_SYSTEM | INTRAUTERINE | Status: AC
  Filled 2024-06-02: qty 1

## 2024-06-02 MED ORDER — PROPOFOL 10 MG/ML IV BOLUS
INTRAVENOUS | Status: DC | PRN
  Administered 2024-06-02: 200 mg via INTRAVENOUS
  Administered 2024-06-02: 150 ug/kg/min via INTRAVENOUS

## 2024-06-02 MED ORDER — DEXAMETHASONE SOD PHOSPHATE PF 10 MG/ML IJ SOLN
INTRAMUSCULAR | Status: DC | PRN
Start: 2024-06-02 — End: 2024-06-02
  Administered 2024-06-02: 5 mg via INTRAVENOUS

## 2024-06-02 MED ORDER — CELECOXIB 200 MG PO CAPS
ORAL_CAPSULE | ORAL | Status: AC
  Filled 2024-06-02: qty 2

## 2024-06-02 MED ORDER — LIDOCAINE HCL (PF) 1 % IJ SOLN
INTRAMUSCULAR | Status: DC | PRN
Start: 2024-06-02 — End: 2024-06-02
  Administered 2024-06-02: 10 mL

## 2024-06-02 MED ORDER — CELECOXIB 200 MG PO CAPS
400.0000 mg | ORAL_CAPSULE | ORAL | Status: AC
  Administered 2024-06-02: 400 mg via ORAL

## 2024-06-02 MED ORDER — ONDANSETRON HCL 4 MG/2ML IJ SOLN
INTRAMUSCULAR | Status: AC
  Filled 2024-06-02: qty 2

## 2024-06-02 MED ORDER — ACETAMINOPHEN 500 MG PO TABS
ORAL_TABLET | ORAL | Status: AC
  Filled 2024-06-02: qty 2

## 2024-06-02 MED ORDER — LACTATED RINGERS IV SOLN: INTRAVENOUS | Status: DC

## 2024-06-02 MED ORDER — FENTANYL CITRATE (PF) 100 MCG/2ML IJ SOLN
INTRAMUSCULAR | Status: AC
  Filled 2024-06-02: qty 2

## 2024-06-02 MED ORDER — MIDAZOLAM HCL 2 MG/2ML IJ SOLN
INTRAMUSCULAR | Status: AC
  Filled 2024-06-02: qty 2

## 2024-06-02 MED ORDER — CHLORHEXIDINE GLUCONATE 0.12 % MT SOLN
15.0000 mL | Freq: Once | OROMUCOSAL | Status: AC
  Administered 2024-06-02: 15 mL via OROMUCOSAL

## 2024-06-02 MED ORDER — ACETAMINOPHEN 500 MG PO TABS
1000.0000 mg | ORAL_TABLET | ORAL | Status: AC
  Administered 2024-06-02: 1000 mg via ORAL

## 2024-06-02 MED ORDER — LIDOCAINE 2% (20 MG/ML) 5 ML SYRINGE
INTRAMUSCULAR | Status: DC | PRN
  Administered 2024-06-02: 100 mg via INTRAVENOUS

## 2024-06-02 MED ORDER — CHLORHEXIDINE GLUCONATE 0.12 % MT SOLN
OROMUCOSAL | Status: AC
  Filled 2024-06-02: qty 15

## 2024-06-02 MED ORDER — FENTANYL CITRATE (PF) 250 MCG/5ML IJ SOLN
INTRAMUSCULAR | Status: DC | PRN
  Administered 2024-06-02 (×2): 50 ug via INTRAVENOUS

## 2024-06-02 MED ORDER — ORAL CARE MOUTH RINSE: 15.0000 mL | Freq: Once | OROMUCOSAL | Status: AC

## 2024-06-02 SURGICAL SUPPLY — 16 items
DEVICE MYOSURE LITE (MISCELLANEOUS) IMPLANT
GLOVE BIO SURGEON STRL SZ7 (GLOVE) ×3 IMPLANT
GLOVE BIOGEL PI IND STRL 7.0 (GLOVE) ×3 IMPLANT
GLOVE BIOGEL PI IND STRL 7.5 (GLOVE) IMPLANT
GLOVE SURG SS PI 7.5 STRL IVOR (GLOVE) IMPLANT
GOWN STRL REUS W/ TWL LRG LVL3 (GOWN DISPOSABLE) IMPLANT
GOWN STRL REUS W/ TWL XL LVL3 (GOWN DISPOSABLE) ×3 IMPLANT
HIBICLENS CHG 4% 4OZ BTL (MISCELLANEOUS) ×3 IMPLANT
KIT PROCED FLUENT PRO FLT212S (KITS) ×3 IMPLANT
KIT TURNOVER KIT B (KITS) ×3 IMPLANT
PACK VAGINAL MINOR WOMEN LF (CUSTOM PROCEDURE TRAY) ×3 IMPLANT
PAD OB MATERNITY 11 LF (PERSONAL CARE ITEMS) ×3 IMPLANT
PAD PREP 24X48 CUFFED NSTRL (MISCELLANEOUS) ×3 IMPLANT
SEAL ROD LENS SCOPE MYOSURE (ABLATOR) ×3 IMPLANT
SOL .9 NS 3000ML IRR UROMATIC (IV SOLUTION) ×3 IMPLANT
TOWEL OR 17X24 6PK STRL BLUE (TOWEL DISPOSABLE) ×3 IMPLANT

## 2024-06-02 NOTE — Anesthesia Procedure Notes (Signed)
 Procedure Name: LMA Insertion Date/Time: 06/02/2024 4:38 PM  Performed by: Spiros Greenfeld A, CRNAPre-anesthesia Checklist: Patient identified, Emergency Drugs available, Suction available and Patient being monitored Patient Re-evaluated:Patient Re-evaluated prior to induction Oxygen Delivery Method: Circle System Utilized Preoxygenation: Pre-oxygenation with 100% oxygen Induction Type: IV induction Ventilation: Mask ventilation without difficulty LMA: LMA inserted LMA Size: 4.0 Number of attempts: 1 Airway Equipment and Method: Bite block Placement Confirmation: positive ETCO2 Tube secured with: Tape Dental Injury: Teeth and Oropharynx as per pre-operative assessment

## 2024-06-02 NOTE — Anesthesia Preprocedure Evaluation (Addendum)
 Anesthesia Evaluation  Patient identified by MRN, date of birth, ID band Patient awake    Reviewed: Allergy & Precautions, NPO status , Patient's Chart, lab work & pertinent test results  History of Anesthesia Complications (+) PONV and history of anesthetic complications  Airway Mallampati: II  TM Distance: >3 FB Neck ROM: Full    Dental no notable dental hx. (+) Teeth Intact, Dental Advisory Given   Pulmonary asthma , former smoker   Pulmonary exam normal breath sounds clear to auscultation       Cardiovascular negative cardio ROS Normal cardiovascular exam Rhythm:Regular Rate:Normal     Neuro/Psych  Headaches PSYCHIATRIC DISORDERS Anxiety        GI/Hepatic negative GI ROS, Neg liver ROS,,,  Endo/Other  negative endocrine ROS    Renal/GU negative Renal ROS  negative genitourinary   Musculoskeletal negative musculoskeletal ROS (+)    Abdominal   Peds  Hematology negative hematology ROS (+)   Anesthesia Other Findings   Reproductive/Obstetrics                              Anesthesia Physical Anesthesia Plan  ASA: 2  Anesthesia Plan: General   Post-op Pain Management: Tylenol  PO (pre-op)* and Celebrex  PO (pre-op)*   Induction: Intravenous  PONV Risk Score and Plan: 4 or greater and Ondansetron , Dexamethasone , Midazolam , Treatment may vary due to age or medical condition, Propofol  infusion and TIVA  Airway Management Planned: LMA  Additional Equipment: None  Intra-op Plan:   Post-operative Plan: Extubation in OR  Informed Consent: I have reviewed the patients History and Physical, chart, labs and discussed the procedure including the risks, benefits and alternatives for the proposed anesthesia with the patient or authorized representative who has indicated his/her understanding and acceptance.     Dental advisory given  Plan Discussed with: CRNA  Anesthesia Plan  Comments:          Anesthesia Quick Evaluation

## 2024-06-02 NOTE — Interval H&P Note (Signed)
 Date of Initial H&P: 05/28/24  History reviewed, patient examined, no change in status, stable for surgery.

## 2024-06-02 NOTE — Transfer of Care (Signed)
 Immediate Anesthesia Transfer of Care Note  Patient: Pamela Martinez  Procedure(s) Performed: DILATATION & CURETTAGE/ DIAGNOSTIC HYSTEROSCOPY MYOSURE RESECTION REMOVAL, INTRAUTERINE DEVICE (Vagina ) INSERTION, INTRAUTERINE DEVICE (Vagina )  Patient Location: PACU  Anesthesia Type:General  Level of Consciousness: sedated  Airway & Oxygen Therapy: Patient Spontanous Breathing and Patient connected to nasal cannula oxygen  Post-op Assessment: Report given to RN and Post -op Vital signs reviewed and stable  Post vital signs: Reviewed and stable  Last Vitals:  Vitals Value Taken Time  BP 111/61 06/02/24 17:36  Temp 36.5 C 06/02/24 17:36  Pulse 57 06/02/24 17:38  Resp 12 06/02/24 17:38  SpO2 96 % 06/02/24 17:38  Vitals shown include unfiled device data.  Last Pain:  Vitals:   06/02/24 1301  TempSrc: Oral  PainSc: 0-No pain      Patients Stated Pain Goal: 5 (06/02/24 1301)  Complications: No notable events documented.

## 2024-06-02 NOTE — Op Note (Signed)
 06/02/2024 Pamela Martinez [id number]  OPERATIVE REPORT  Preop Diagnosis: Pre-Op Diagnosis Codes:     * PMB (postmenopausal bleeding) [N95.0]    * Thickened endometrium [R93.89]   Post operative diagnosis: same  Procedure: Procedure(s): DILATATION & CURETTAGE/ DIAGNOSTIC HYSTEROSCOPY MYOSURE RESECTION REMOVAL, INTRAUTERINE DEVICE INSERTION, INTRAUTERINE DEVICE   Surgeon: Vera LULLA Pa, MD Assistant: none  Anesthesia: MAC Fluids: please see anesthesia report Fluid deficit: <300cc  Complications: IUD strings trimmed with mysoure, requiring removal of prior IUD and insertion of a new Mirena  IUD  Findings: Cervical stenosis. Uterine cavity measuring 8cm with both ostia seen. Endometrial polypoid like tissue was noted.   Estimated blood loss: Minimal  Specimens:  ID Type Source Tests Collected by Time Destination  1 : endometrial polyps and curettings Tissue PATH Gyn biopsy SURGICAL PATHOLOGY Pa Vera LULLA, MD 06/02/2024 1709     Disposition of specimen: Pathology    Procedure: Patient was taken to the OR where she was placed in dorsal lithotomy in stirrups. She voided prior to transport. SCDs were in place.  The patient was prepped and draped in the usual sterile fashion. An adequate timeout was obtained and everyone agreed. A bivalve speculum was placed inside the vagina and the cervix visualized. The cervix was grasped anteriorly with a single-tooth tenaculum. Paracervical block was performed with 1% lidocaine .  The uterus sounded to 8 cm. Sequential cervical dilation was done to 30fr due to cervical stenosis, and the myosure hysteroscope was introduced into the uterine cavity.  Findings as noted. Endometrial polyps were morcellated using Myosure. The myosure was also used to sample the entire cavity. During this process, the IUD strings were completely trimmed. The Mirena  IUD was removed. A sharp curettage was then performed to ensure complete sampling of the cavity and  was sent to pathology. A new Mirena  IUD was inserted according to manufacturer's protocol. All instrument, sponge and lap counts were correct x2. The patient was awakened taken to recovery room in stable condition.  Vera LULLA Pa, MD 06/02/24 5:31 PM

## 2024-06-03 ENCOUNTER — Encounter (HOSPITAL_COMMUNITY): Payer: Self-pay | Admitting: Obstetrics and Gynecology

## 2024-06-04 NOTE — Anesthesia Postprocedure Evaluation (Signed)
 Anesthesia Post Note  Patient: Pamela Martinez, Pamela Martinez  Procedure(s) Performed: DILATATION & CURETTAGE/ DIAGNOSTIC HYSTEROSCOPY MYOSURE RESECTION REMOVAL, INTRAUTERINE DEVICE (Vagina ) INSERTION, INTRAUTERINE DEVICE (Vagina )     Patient location during evaluation: PACU Anesthesia Type: General Level of consciousness: sedated and patient cooperative Pain management: pain level controlled Vital Signs Assessment: post-procedure vital signs reviewed and stable Respiratory status: spontaneous breathing Cardiovascular status: stable Anesthetic complications: no   No notable events documented.  Last Vitals:  Vitals:   06/02/24 1800 06/02/24 1815  BP: 106/64 110/65  Pulse: (!) 58 (!) 49  Resp: 11 12  Temp:  36.5 C  SpO2: 99% 100%    Last Pain:  Vitals:   06/02/24 1800  TempSrc:   PainSc: 0-No pain                 Norleen Pope

## 2024-06-05 LAB — SURGICAL PATHOLOGY

## 2024-06-08 ENCOUNTER — Ambulatory Visit: Payer: Self-pay | Admitting: Obstetrics and Gynecology

## 2024-06-15 ENCOUNTER — Encounter: Payer: Self-pay | Admitting: Obstetrics and Gynecology

## 2024-06-15 ENCOUNTER — Ambulatory Visit (INDEPENDENT_AMBULATORY_CARE_PROVIDER_SITE_OTHER): Admitting: Obstetrics and Gynecology

## 2024-06-15 VITALS — BP 118/62 | HR 85 | Temp 98.2°F | Ht 64.5 in | Wt 180.0 lb

## 2024-06-15 DIAGNOSIS — Z09 Encounter for follow-up examination after completed treatment for conditions other than malignant neoplasm: Secondary | ICD-10-CM

## 2024-06-15 DIAGNOSIS — N898 Other specified noninflammatory disorders of vagina: Secondary | ICD-10-CM

## 2024-06-15 DIAGNOSIS — Z975 Presence of (intrauterine) contraceptive device: Secondary | ICD-10-CM

## 2024-06-15 LAB — WET PREP FOR TRICH, YEAST, CLUE

## 2024-06-15 NOTE — Progress Notes (Signed)
 53 y.o. G9P2002 female with left breast cancer (diagnosed 2021, s/p lumpectomy, on tamoxifen ), Mirena  IUD (inserted 11/2021) here for 2wk postop exam for hyst, D&C polypectomy, Mirena  IUD removal with reinsertion for postmenopausal bleeding and endometrial polyp here for postop exam. Married.  No LMP recorded (approximate). (Menstrual status: IUD).   H/o hyst, D&C polypectomy 2023, benign pathology.  ET was 12 mm at that time.  Mirena  IUD was inserted for perimenopausal AUB.  Pt states she is doing well. Noticing thick brown discharge. Denies N/V, abdominal pain, VB, dysuria. Normal BM and voids.  06/02/24 path: A. ENDOMETRIUM, CURETTAGE AND POLYPS:  - Fragments of benign endometrial polyp.  - Background inactive endometrium with pseudodecidualization suggestive  of exogenous hormone effect.   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 debera , note 11-18-2019 in epic showed Sinus w/ rare PAC/ PVC;   (11-23-2021 pt stated has not had near syncope or syncope since )   Migraine    Personal history of radiation therapy    left breast 03-07-2020  to  04-01-2020   PONV (postoperative nausea and  vomiting)    as teen   Restless leg syndrome    Wears glasses    Past Surgical History:  Procedure Laterality Date   ANTERIOR CRUCIATE LIGAMENT REPAIR Left 1990   BREAST EXCISIONAL BIOPSY     BREAST LUMPECTOMY     BREAST LUMPECTOMY WITH RADIOACTIVE SEED LOCALIZATION Bilateral 01/29/2020   Procedure: BILATERAL BREAST LUMPECTOMY WITH RADIOACTIVE SEED LOCALIZATION;  Surgeon: Curvin Deward MOULD, MD;  Location: [facility] OR;  Service: General;  Laterality: Bilateral;   COLONOSCOPY WITH PROPOFOL  N/A 10/05/2021   Procedure: COLONOSCOPY WITH PROPOFOL ;  Surgeon: Golda Claudis PENNER, MD;  Location: [facility];  Service: Endoscopy;  Laterality: N/A;  1245   DILATATION & CURETTAGE/HYSTEROSCOPY WITH MYOSURE N/A 11/27/2021   Procedure: DILATATION & CURETTAGE/HYSTEROSCOPY WITH MYOSURE;  Surgeon: Jannis Kate Norris, MD;  Location: [facility];  Service: Gynecology;  Laterality: N/A;   DILATATION & CURRETTAGE/HYSTEROSCOPY WITH RESECTOCOPE N/A 06/02/2024   Procedure: DILATATION & CURETTAGE/ DIAGNOSTIC HYSTEROSCOPY;  Surgeon: Dallie Vera GAILS, MD;  Location: [facility] OR;  Service: Gynecology;  Laterality: N/A;  Myosure; polypectomy   INTRAUTERINE DEVICE (IUD) INSERTION N/A 11/27/2021   Procedure: INTRAUTERINE DEVICE (IUD) INSERTION;  Surgeon: Jertson, Jill Evelyn, MD;  Location: [facility];  Service: Gynecology;  Laterality: N/A;   INTRAUTERINE DEVICE (IUD) INSERTION  06/02/2024   Procedure: INSERTION, INTRAUTERINE DEVICE;  Surgeon: Dallie Vera GAILS, MD;  Location: [facility] OR;  Service: Gynecology;;   IUD REMOVAL N/A 11/27/2021   Procedure: INTRAUTERINE DEVICE (IUD) REMOVAL;  Surgeon: Jannis Kate Norris, MD;  Location: [facility];  Service: Gynecology;  Laterality: N/A;   IUD REMOVAL  06/02/2024   Procedure: REMOVAL, INTRAUTERINE DEVICE;  Surgeon: Dallie Vera GAILS, MD;  Location: [facility] OR;  Service: Gynecology;;   KNEE ARTHROSCOPY Bilateral    x3   1980s   MYOSURE RESECTION N/A  06/02/2024   Procedure: MELINDA RESECTION;  Surgeon: Dallie Vera GAILS, MD;  Location: [facility] OR;  Service: Gynecology;  Laterality: N/A;   WISDOM TOOTH EXTRACTION     Current Outpatient Medications on File Prior to Visit  Medication Sig Dispense Refill   ALPRAZolam  (XANAX ) 0.5 MG tablet Take 0.5 mg by mouth at bedtime as needed for anxiety.     Cholecalciferol (VITAMIN D3 PO) Take by mouth.     Cyanocobalamin (VITAMIN B 12 PO) Take by mouth.     desvenlafaxine  (PRISTIQ ) 50 MG 24 hr tablet Take 1 tablet (50 mg total) by mouth daily.     ibuprofen (ADVIL) 200 MG tablet Take 200 mg by mouth every 6 (six) hours as needed.     Lansoprazole (PREVACID PO) Take by mouth as needed.     levocetirizine (XYZAL ) 5 MG tablet      levonorgestrel  (MIRENA ) 20 MCG/24HR IUD 1 each by Intrauterine route once. INSERTED 05-01-06. 1 each    tamoxifen  (NOLVADEX ) 10 MG tablet TAKE 1/2 TABLET TWICE A DAY BY MOUTH 90 tablet 2   Fezolinetant  (VEOZAH ) 45 MG TABS Take 1 tablet (45 mg total) by mouth daily at 12 noon. (Patient not taking: Reported on 06/15/2024)     Current Facility-Administered Medications on File Prior to Visit  Medication Dose Route Frequency Provider Last Rate Last Admin   levonorgestrel  (MIRENA ) 20 MCG/24HR IUD   Intrauterine Once Fontaine, Evalene SQUIBB, MD       Allergies  Allergen Reactions   Diflucan  [Fluconazole ] Dermatitis    Causes welts   Sulfa Antibiotics Itching      PE Today's Vitals   06/15/24 1418  BP: 118/62  Pulse: 85  Temp: 98.2 F (36.8 C)  TempSrc: Oral  SpO2: 99%  Weight: 180 lb (81.6 kg)  Height: 5' 4.5 (1.638 m)     Body mass index is 30.42 kg/m.  Physical Exam Vitals reviewed. Exam conducted with a chaperone present.  Constitutional:      General: She is not in acute distress.    Appearance: Normal appearance.  HENT:     Head: Normocephalic and atraumatic.     Nose: Nose normal.  Eyes:     Extraocular Movements: Extraocular movements intact.      Conjunctiva/sclera: Conjunctivae normal.  Pulmonary:     Effort: Pulmonary effort is normal.  Genitourinary:    General: Normal vulva.     Exam position: Lithotomy position.     Vagina: Vaginal discharge present.     Cervix: Normal. No cervical motion tenderness, discharge or lesion.     Uterus: Normal. Not enlarged and not tender.      Adnexa: Right adnexa normal and left adnexa normal.     Comments: +IUD strings Musculoskeletal:        General: Normal range of motion.     Cervical back: Normal range of motion.  Neurological:     General: No focal deficit present.     Mental Status: She is alert.  Psychiatric:        Mood and Affect: Mood normal.        Behavior: Behavior normal.    Assessment and Plan:        Postop check Normal postop exam.  Pathology reviewed with patient. Cleared to return to work. All questions answered.  Uses hormone releasing intrauterine device (IUD) for contraception Replaced, good for 5-82yr Reviewed spotting is normal for another 4-6wk  Vaginal discharge -     WET PREP FOR TRICH, YEAST, CLUE    Vera LULLA Pa, MD

## 2024-06-15 NOTE — Assessment & Plan Note (Signed)
 Reinserted 06/02/24 during hysteroscopy May continue 5-30yr

## 2024-07-03 ENCOUNTER — Ambulatory Visit: Admitting: Family Medicine

## 2024-07-03 ENCOUNTER — Encounter: Payer: Self-pay | Admitting: Family Medicine

## 2024-07-03 VITALS — BP 118/80 | HR 68 | Temp 97.9°F | Ht 64.5 in | Wt 185.4 lb

## 2024-07-03 DIAGNOSIS — Z853 Personal history of malignant neoplasm of breast: Secondary | ICD-10-CM

## 2024-07-03 DIAGNOSIS — J302 Other seasonal allergic rhinitis: Secondary | ICD-10-CM

## 2024-07-03 DIAGNOSIS — F411 Generalized anxiety disorder: Secondary | ICD-10-CM

## 2024-07-03 DIAGNOSIS — Z975 Presence of (intrauterine) contraceptive device: Secondary | ICD-10-CM

## 2024-07-03 DIAGNOSIS — Z0000 Encounter for general adult medical examination without abnormal findings: Secondary | ICD-10-CM

## 2024-07-03 DIAGNOSIS — R55 Syncope and collapse: Secondary | ICD-10-CM

## 2024-07-03 DIAGNOSIS — Z9889 Other specified postprocedural states: Secondary | ICD-10-CM

## 2024-07-03 DIAGNOSIS — G2581 Restless legs syndrome: Secondary | ICD-10-CM

## 2024-07-03 DIAGNOSIS — Z6831 Body mass index (BMI) 31.0-31.9, adult: Secondary | ICD-10-CM

## 2024-07-03 DIAGNOSIS — K219 Gastro-esophageal reflux disease without esophagitis: Secondary | ICD-10-CM

## 2024-07-03 DIAGNOSIS — N951 Menopausal and female climacteric states: Secondary | ICD-10-CM

## 2024-07-03 DIAGNOSIS — K760 Fatty (change of) liver, not elsewhere classified: Secondary | ICD-10-CM

## 2024-07-03 NOTE — Progress Notes (Unsigned)
 "   Patient Care Team: Prentiss Frieze, Pamela Martinez as PCP - General (Family Medicine) Debera Jayson MATSU, MD as PCP - Cardiology (Cardiology) Curvin Deward MOULD, MD as Surgeon (General Surgery) Odean Potts, MD as Medical Oncologist (Hematology and Oncology) Izell Domino, MD as Radiation Oncologist (Radiation Oncology)  Subjective   History of Present Illness Pamela Martinez is a 53 year old female who presents for a new patient visit.  Syncope - Recurrent episodes since adolescence, characterized by sudden loss of consciousness without clear warning. - Early episodes often triggered by medical procedures or locking knees. - No episodes for approximately 30 years, with recurrence in early 2021 around the time of breast cancer diagnosis. - Current frequency is approximately once per year, typically during intercurrent illness or shortly after starting new medications (e.g., Vraylar). - Cardiology and neurology evaluations, including scans and tests, have not identified a cause. - Blood pressure typically normal during episodes. - No blood glucose measurements performed during events.  Vasomotor symptoms - Prominent hot flashes, especially nocturnal. - Currently taking tamoxifen  for breast cancer, with approximately one year remaining. - Veozah  previously provided relief but was discontinued due to insurance coverage.  Gynecologic history - History of uterine polyps treated with dilation and curettage (D&C). - Currently has an intrauterine device (IUD) in place. - No history of iron deficiency, but has experienced bleeding episodes discussed with gynecology.  Allergic rhinitis - Seasonal allergies managed with daily Zyrtec.  Gastroesophageal reflux - Occasional reflux symptoms, improved after cessation of alcohol consumption.  Restless legs syndrome - Symptoms disrupt sleep. - Uses Xanax  as needed for symptom control.  Anxiety - Takes Pristiq  for anxiety. - Continues to  experience anxiety symptoms, including teeth grinding and irritability. - Did not tolerate Wellbutrin due to exacerbation of teeth grinding.  Nutritional supplementation - Takes vitamin B12 supplements without clear benefit.  Review of Systems: Negative, with the exception of above mentioned in HPI.  History   Reviewed by clinician on day of visit: allergies, medications, problem list, medical history, surgical history, family history, social history, and previous encounter notes.  Medications   Show/hide medication list[1] Allergies[2]  Objective   BP 118/80   Pulse 68   Temp 97.9 F (36.6 C) (Temporal)   Ht 5' 4.5 (1.638 m)   Wt 185 lb 6.4 oz (84.1 kg)   SpO2 98%   BMI 31.33 kg/m  {Insert last BP/Wt (optional):23777}{See vitals history (optional):1}  Physical Exam Constitutional:      General: She is not in acute distress.    Appearance: She is well-developed.  HENT:     Head: Normocephalic and atraumatic.  Eyes:     Conjunctiva/sclera: Conjunctivae normal.  Cardiovascular:     Rate and Rhythm: Normal rate and regular rhythm.     Heart sounds: Normal heart sounds.  Pulmonary:     Effort: Pulmonary effort is normal.     Breath sounds: Normal breath sounds.  Neurological:     General: No focal deficit present.     Mental Status: She is alert.  Psychiatric:        Behavior: Behavior normal.    {Insert previous labs (optional):23779} {See past labs  Heme  Chem  Endocrine  Serology  Results Review (optional):1}   Screening       07/03/2024   11:01 AM 10/15/2023    8:03 AM  Depression screen PHQ 2/9  Decreased Interest 0 0  Down, Depressed, Hopeless 0 0  PHQ - 2 Score 0 0  Altered sleeping 1   Tired, decreased energy 1   Change in appetite 2   Feeling bad or failure about yourself  0   Trouble concentrating 0   Moving slowly or fidgety/restless 0   Suicidal thoughts 0   PHQ-9 Score 4   Difficult doing work/chores Not difficult at all        07/03/2024   10:56 AM 10/15/2023    8:03 AM 09/27/2022    8:30 AM  Fall Risk   Falls in the past year? 0 0 0  Number falls in past yr: 0 0 0  Injury with Fall? 0 0  0   Risk for fall due to : No Fall Risks    Follow up Falls evaluation completed       Data saved with a previous flowsheet row definition   Diagnoses and Orders   1. Menopausal and female climacteric states   2. Seasonal allergic rhinitis, unspecified trigger   3. History of dilatation and curettage, due to endometrial polyps and excessive vaginal bleeding, IUD placed   4. Restless legs syndrome   5. Recurrent syncope   6. GAD (generalized anxiety disorder)   7. History of breast cancer, left DCIS, on Tamoxifen , year 4   8. IUD (intrauterine device) in place, 06/02/24   9. Gastroesophageal reflux disease without esophagitis   10. Hepatic steatosis   11. Healthcare maintenance   12. Class 1 obesity with serious comorbidity and body mass index (BMI) of 31.0 to 31.9 in adult, unspecified obesity type    Meds ordered this encounter  Medications   Fezolinetant  (VEOZAH ) 45 MG TABS    Sig: Take 1 tablet (45 mg total) by mouth daily at 12 noon.    Dispense:  90 tablet    Refill:  1   No orders of the defined types were placed in this encounter.  Assessment and Plan   Assessment and Plan Assessment & Plan Dysautonomia with recurrent syncope Recurrent syncope since 2021, possibly linked to dysautonomia or POTS. Previous evaluations unremarkable. Dehydration noted as a factor. Insurance coverage for POTS clinic uncertain. - Consider continuous glucose monitoring for blood sugar variability. - Encouraged hydration with electrolytes. - Evaluate insurance coverage for POTS clinic referral. - Consider further testing if syncope persists.  History of estrogen receptor positive breast cancer on tamoxifen  On tamoxifen  for four years with one year remaining. Continue regular follow-up for recurrence. Discussed vaginal estrogen  safety with tamoxifen . - Continue tamoxifen  therapy. - Continue regular oncology follow-ups. - Will discuss vaginal estrogen use with oncologist.  Menopausal vasomotor symptoms Symptoms include night sweats affecting sleep. Veozah  effective but discontinued due to insurance issues. Suitable for women on tamoxifen . - Prescribed Veozah  for vasomotor symptoms. - Monitor liver function tests while on Veozah .  Depression and anxiety Managed with Pristiq , partially effective. Previous trials of Wellbutrin and Zoloft ineffective. Discussed trazodone for sleep and anxiety. Consider Botox for muscle tension. - Continue Pristiq  for depression and anxiety. - Consider trazodone for sleep and anxiety. - Evaluate potential use of Botox for jaw and trapezius muscle tension.  Restless legs syndrome Symptoms managed with Xanax . No recent ferritin checks. Discussed importance of checking ferritin levels. - Check ferritin levels to assess for iron deficiency. - Continue Xanax  as needed for restless legs.  Seasonal allergic rhinitis Managed with daily Zyrtec. Symptoms occur if medication is missed. - Continue daily Zyrtec for allergy management.  History of endometrial polyps, s/p D&C and IUD placement Recent D&C due to bleeding. IUD  replaced. Told that she was not a candidate for hysterectomy. - Continue monitoring for further bleeding or IUD issues.  General Health Maintenance Up to date with screenings and vaccinations. Discussed vitamin D and magnesium for bone health and relaxation. - Ensure regular screenings and vaccinations are maintained. - Consider vitamin D and magnesium supplementation.  Pamela Shutter, DO, Pamela Martinez, Pamela Martinez, [facility] [address] [place], [address] Dept: [phone] Dept Fax: [phone]  Attestations   Patient is establishing care in this system with me as PCP. Available records reviewed. Chart updated today  with reconciliation of problem list, medications, allergies, and relevant history. Preventive care and chronic disease status reviewed.  Portions of historical chart may remain incomplete; will update on an ongoing basis as clinically indicated.  Discussed the use of AI scribe software for clinical note transcription with the patient, who gave verbal consent to proceed. As the patient's primary care physician, board-certified in Family Medicine and Obesity Medicine, I am providing ongoing, comprehensive obesity care based on the pillars of obesity medicine, including nutrition therapy, physical activity, behavioral modification, and pharmacologic treatment.  I personally spent a total of 46 minutes in the care of the patient today including preparing to see the patient, performing a medically appropriate exam/evaluation, counseling and educating, placing orders, documenting clinical information in the EHR, and coordinating care.     [1]  Outpatient Medications Prior to Visit  Medication Sig   ALPRAZolam  (XANAX ) 0.5 MG tablet Take 0.5 mg by mouth at bedtime as needed for anxiety.   Cholecalciferol (VITAMIN D3 PO) Take by mouth.   Cyanocobalamin (VITAMIN B 12 PO) Take by mouth.   desvenlafaxine  (PRISTIQ ) 50 MG 24 hr tablet Take 1 tablet (50 mg total) by mouth daily.   ibuprofen (ADVIL) 200 MG tablet Take 200 mg by mouth every 6 (six) hours as needed.   Lansoprazole (PREVACID PO) Take by mouth as needed.   levocetirizine (XYZAL ) 5 MG tablet    levonorgestrel  (MIRENA ) 20 MCG/24HR IUD 1 each by Intrauterine route once. INSERTED 05-01-06.   tamoxifen  (NOLVADEX ) 10 MG tablet TAKE 1/2 TABLET TWICE A DAY BY MOUTH   [DISCONTINUED] Fezolinetant  (VEOZAH ) 45 MG TABS Take 1 tablet (45 mg total) by mouth daily at 12 noon. (Patient not taking: Reported on 07/03/2024)   Facility-Administered Medications Prior to Visit  Medication Dose Route Frequency Provider   levonorgestrel  (MIRENA ) 20 MCG/24HR IUD    Intrauterine Once Fontaine, Evalene SQUIBB, MD  [2]  Allergies Allergen Reactions   Diflucan  [Fluconazole ] Dermatitis    Causes welts   Sulfa Antibiotics Itching   "

## 2024-07-04 ENCOUNTER — Encounter: Payer: Self-pay | Admitting: Family Medicine

## 2024-07-04 DIAGNOSIS — K219 Gastro-esophageal reflux disease without esophagitis: Secondary | ICD-10-CM | POA: Insufficient documentation

## 2024-07-04 DIAGNOSIS — Z853 Personal history of malignant neoplasm of breast: Secondary | ICD-10-CM | POA: Insufficient documentation

## 2024-07-04 DIAGNOSIS — N951 Menopausal and female climacteric states: Secondary | ICD-10-CM | POA: Insufficient documentation

## 2024-07-04 DIAGNOSIS — K760 Fatty (change of) liver, not elsewhere classified: Secondary | ICD-10-CM | POA: Insufficient documentation

## 2024-07-04 DIAGNOSIS — Z9889 Other specified postprocedural states: Secondary | ICD-10-CM | POA: Insufficient documentation

## 2024-07-04 DIAGNOSIS — Z6831 Body mass index (BMI) 31.0-31.9, adult: Secondary | ICD-10-CM | POA: Insufficient documentation

## 2024-07-04 DIAGNOSIS — G2581 Restless legs syndrome: Secondary | ICD-10-CM | POA: Insufficient documentation

## 2024-07-04 DIAGNOSIS — F411 Generalized anxiety disorder: Secondary | ICD-10-CM | POA: Insufficient documentation

## 2024-07-04 DIAGNOSIS — Z0000 Encounter for general adult medical examination without abnormal findings: Secondary | ICD-10-CM | POA: Insufficient documentation

## 2024-07-04 DIAGNOSIS — R55 Syncope and collapse: Secondary | ICD-10-CM | POA: Insufficient documentation

## 2024-07-04 DIAGNOSIS — J302 Other seasonal allergic rhinitis: Secondary | ICD-10-CM | POA: Insufficient documentation

## 2024-07-04 MED ORDER — VEOZAH 45 MG PO TABS: 1.0000 | ORAL_TABLET | Freq: Every day | ORAL | 1 refills | Status: AC

## 2024-07-04 NOTE — Assessment & Plan Note (Signed)
 Restless Legs Syndrome Symptoms disrupt sleep. Uses alprazolam  as needed. Ferritin not recently assessed.  Plan: Check ferritin level Continue alprazolam  as needed Reassess management based on iron status

## 2024-07-04 NOTE — Assessment & Plan Note (Signed)
 Incidental Hepatic Steatosis Diffuse fatty infiltration noted incidentally on CT chest 08/15/2022. Liver enzymes normal.  Plan: Counsel on nutrition, weight management, and alcohol avoidance Monitor liver enzymes; no further imaging indicated at this time

## 2024-07-04 NOTE — Assessment & Plan Note (Addendum)
 Longstanding history of syncope since adolescence with classic vasovagal triggers including blood donation, medical procedures, injury, and hospital exposure. Long asymptomatic interval of approximately 30 years with recurrence beginning in 2021.  Notable episode on 09/16/2019 consisted of nocturnal syncope with brief recurrent loss of consciousness (less than 1 minute each), diaphoresis, and urinary incontinence. No convulsions, tongue biting, or post-ictal confusion. Rapid recovery. Neurology felt history was consistent with syncope rather than seizure.  Extensive workup has been unrevealing: EEG 10/21/2019: normal 7-day cardiac monitor 12/08/2019: sinus rhythm, rare PACs/PVCs (<1%), no pauses or arrhythmias CT head 09/17/2019 and 04/01/2024: no acute intracranial abnormality MRI brain 12/28/2019: no acute pathology Echocardiogram 06/21/2022: EF 60-65%, normal GLS, mild grade I diastolic dysfunction, no structural heart disease Coronary calcium score 08/15/2022: CAC = 0  Current episodes occur approximately once per year, often associated with intercurrent illness, dehydration, medication changes, or heat exposure (hot shower noted by oncology). Blood pressure typically normal during episodes. No glucose measurements obtained during events.  Assessment: Recurrent syncope with strong vasovagal/dysautonomia features. No evidence of structural cardiac, arrhythmic, neurologic, or ischemic etiology.  Plan: Encourage aggressive hydration with electrolytes Avoid heat exposure and prolonged standing Consider continuous glucose monitoring to assess for glycemic variability during episodes Investigate insurance coverage for dysautonomia/POTS clinic evaluation Reassess if frequency, severity, or characteristics change

## 2024-07-04 NOTE — Assessment & Plan Note (Signed)
 Menopausal Vasomotor Symptoms Significant nocturnal hot flashes affecting sleep. Non-hormonal therapy appropriate given breast cancer history.  Plan: Resume fezolinetant  (Veozah ) Monitor liver function tests per prescribing guidelines Reassess symptom control and medication access

## 2024-07-04 NOTE — Assessment & Plan Note (Signed)
 Seasonal Allergic Rhinitis Well controlled on daily antihistamine.  Plan: Continue levocetirizine (Xyzal )

## 2024-07-04 NOTE — Assessment & Plan Note (Signed)
 Associated with vasomotor symptoms, GERD history, and metabolic risk.  Plan: Lifestyle counseling including nutrition, physical activity, and sleep optimization Address contributing factors including menopause and anxiety

## 2024-07-04 NOTE — Assessment & Plan Note (Signed)
 Intermittent symptoms, improved with alcohol cessation.  Plan: Continue lansoprazole as needed Reinforce lifestyle modification

## 2024-07-04 NOTE — Assessment & Plan Note (Signed)
 Gynecologic History / IUD Mirena  IUD placed 11/2021. Routine IUD check on 09/27/2022 showed strings visible with occasional light spotting. Patient aware of 8-year FDA approval. History of endometrial polyps treated with D&C and hysteroscopy (2023 and 2025).  Plan: Continue routine surveillance Monitor for recurrent abnormal uterine bleeding Follow with gynecology as indicated

## 2024-07-04 NOTE — Assessment & Plan Note (Addendum)
 History of Estrogen Receptor-Positive DCIS, Left Breast Diagnosed 11/04/2019. Low-grade DCIS involving intraductal papilloma, ER/PR 100%.  Treatment course:  01/29/2020: Bilateral lumpectomies Left breast: DCIS with clear margins Right breast: No carcinoma  03/08/2020-04/05/2020: Whole-breast radiation therapy (42.56 Gy)  Tamoxifen  initiated 06/2020 (brief interruption early in course)  Current status: On tamoxifen  5 mg daily, year 4 of planned 5-year course Oncology considering extension through June 2026 due to approximately 37-month interruption Breast exam 07/20/2022 benign Mammogram 09/18/2021 benign, breast density category C  Plan: Continue tamoxifen  per oncology Continue annual oncology follow-up Discuss vaginal estrogen use with oncology prior to initiation

## 2024-07-04 NOTE — Assessment & Plan Note (Signed)
 Generalized Anxiety Disorder / Depression Chronic anxiety with irritability, bruxism, and sleep disturbance. Partial response to desvenlafaxine . Prior intolerance to bupropion (worsened bruxism) and inadequate response to sertraline.  Plan: Continue desvenlafaxine  (Pristiq ) Consider trazodone for sleep and anxiety Discuss referral for botulinum toxin injections for jaw and trapezius muscle tension

## 2024-07-22 ENCOUNTER — Inpatient Hospital Stay: Payer: 59 | Attending: Hematology and Oncology | Admitting: Hematology and Oncology

## 2024-07-22 VITALS — BP 112/68 | HR 86 | Temp 98.3°F | Resp 18 | Ht 64.5 in | Wt 179.7 lb

## 2024-07-22 DIAGNOSIS — Z923 Personal history of irradiation: Secondary | ICD-10-CM | POA: Insufficient documentation

## 2024-07-22 DIAGNOSIS — Z79810 Long term (current) use of selective estrogen receptor modulators (SERMs): Secondary | ICD-10-CM | POA: Insufficient documentation

## 2024-07-22 DIAGNOSIS — D0512 Intraductal carcinoma in situ of left breast: Secondary | ICD-10-CM | POA: Insufficient documentation

## 2024-07-22 NOTE — Progress Notes (Signed)
 "  Patient Care Team: Pamela Frieze, DO as PCP - General (Family Medicine) Pamela Jayson MATSU, MD as PCP - Cardiology (Cardiology) Pamela Deward MOULD, MD as Surgeon (General Surgery) Pamela Potts, MD as Medical Oncologist (Hematology and Oncology) Pamela Domino, MD as Radiation Oncologist (Radiation Oncology)  DIAGNOSIS:  Encounter Diagnosis  Name Primary?   Ductal carcinoma in situ (DCIS) of left breast Yes    SUMMARY OF ONCOLOGIC HISTORY: Oncology History  Ductal carcinoma in situ (DCIS) of left breast  11/04/2019 Initial Diagnosis   Screening mammogram on 10/12/19 detected a possible left breast mass. Diagnostic mammogram and US  on 10/27/19 showed a 0.6cm mass at the 8:30 position in the left breast. Biopsy on 11/04/19 showed ductal carcinoma in situ with calcifications partially involving an intraductal papilloma, low grade, ER/PR+ 100%.    01/29/2020 Surgery   Bilateral lumpectomies Pamela Martinez) [phone]): left breast: low grade DCIS involving papilloma, clear margins; right breast: no evidence of carcinoma. No regional lymph nodes were examined.   02/01/2020 Cancer Staging   Staging form: Breast, AJCC 8th Edition - Pathologic stage from 02/01/2020: Stage 0 (pTis (DCIS), pN0, cM0, ER+, PR+)   03/07/2020 - 04/01/2020 Radiation Therapy   The patient initially received a dose of 42.56 Gy in 16 fractions to the breast using whole-breast tangent fields. This was delivered using a 3-D conformal technique. The total dose was 42.56 Gy.   06/2020 - 06/2025 Anti-estrogen oral therapy   Tamoxifen      CHIEF COMPLIANT: Follow-up on tamoxifen  therapy  HISTORY OF PRESENT ILLNESS:  History of Present Illness The patient, with estrogen receptor-positive breast cancer on tamoxifen , presents for routine oncology follow-up.  She continues tamoxifen , taking half a pill twice daily, with good adherence and no treatment-limiting side effects.  She has mild hot flashes that worsened after stopping  fezolinetant  due to cost. Fezolinetant  had provided partial relief. She does not have night sweats but often wakes feeling overheated.  Her most recent mammogram showed category C breast density. She has no new breast symptoms or concerns.     ALLERGIES:  is allergic to diflucan  [fluconazole ] and sulfa antibiotics.  MEDICATIONS:  Current Outpatient Medications  Medication Sig Dispense Refill   ALPRAZolam  (XANAX ) 0.5 MG tablet Take 0.5 mg by mouth at bedtime as needed for anxiety.     Cholecalciferol (VITAMIN D3 PO) Take by mouth.     Cyanocobalamin (VITAMIN B 12 PO) Take by mouth.     desvenlafaxine  (PRISTIQ ) 50 MG 24 hr tablet Take 1 tablet (50 mg total) by mouth daily.     Fezolinetant  (VEOZAH ) 45 MG TABS Take 1 tablet (45 mg total) by mouth daily at 12 noon. 90 tablet 1   ibuprofen (ADVIL) 200 MG tablet Take 200 mg by mouth every 6 (six) hours as needed.     Lansoprazole (PREVACID PO) Take by mouth as needed.     levocetirizine (XYZAL ) 5 MG tablet      levonorgestrel  (MIRENA ) 20 MCG/24HR IUD 1 each by Intrauterine route once. INSERTED 05-01-06. 1 each    tamoxifen  (NOLVADEX ) 10 MG tablet TAKE 1/2 TABLET TWICE A DAY BY MOUTH 90 tablet 2   Current Facility-Administered Medications  Medication Dose Route Frequency Provider Last Rate Last Admin   levonorgestrel  (MIRENA ) 20 MCG/24HR IUD   Intrauterine Once Pamela Martinez, Pamela SQUIBB, MD        PHYSICAL EXAMINATION: ECOG PERFORMANCE STATUS: 1 - Symptomatic but completely ambulatory  Vitals:   07/22/24 0903  BP: 112/68  Pulse: 86  Resp:  18  Temp: 98.3 F (36.8 C)  SpO2: 100%   Filed Weights   07/22/24 0903  Weight: 179 lb 11.2 oz (81.5 kg)      LABORATORY DATA:  I have reviewed the data as listed    Latest Ref Rng & Units 04/01/2024   10:20 AM 06/10/2022    2:26 PM 09/22/2020    2:22 PM  CMP  Glucose 70 - 99 mg/dL 886  880  79   BUN 6 - 20 mg/dL 24  24  19    Creatinine 0.44 - 1.00 mg/dL 9.10  9.17  9.27   Sodium 135 - 145  mmol/L 141  142  140   Potassium 3.5 - 5.1 mmol/L 3.9  3.8  3.9   Chloride 98 - 111 mmol/L 109  110  104   CO2 22 - 32 mmol/L 21  24  26    Calcium 8.9 - 10.3 mg/dL 9.3  9.4  9.4   Total Protein 6.5 - 8.1 g/dL 5.9   6.7   Total Bilirubin 0.0 - 1.2 mg/dL 0.4   0.7   Alkaline Phos 38 - 126 U/L 81     AST 15 - 41 U/L 19   16   ALT 0 - 44 U/L 23   24     Lab Results  Component Value Date   WBC 5.1 04/01/2024   HGB 13.8 04/01/2024   HCT 42.8 04/01/2024   MCV 96.6 04/01/2024   PLT 196 04/01/2024   NEUTROABS 3,913 09/22/2020    ASSESSMENT & PLAN:  Ductal carcinoma in situ (DCIS) of left breast 01/29/20: Bilateral lumpectomies Pamela Martinez):  Left breast: low grade DCIS involving papilloma, clear margins ER/PR 100%;  Right breast: no evidence of carcinoma   Treatment plan: 1.  Adjuvant radiation therapy 03/08/2020-04/05/2020 2. followed by adjuvant antiestrogen therapy with tamoxifen  x5 years Now on 10 mg every other day We may extend her therapy until June 2026 because for 6 months she did not take tamoxifen  after she started.   Tamoxifen  toxicities: Denies any side effects of tamoxifen   Breast cancer surveillance: Breast exam 07/21/2024: Benign Mammogram 10/10/23: Benign breast density category C   Return to clinic on an as-needed basis    No orders of the defined types were placed in this encounter.  The patient has a good understanding of the overall plan. she agrees with it. she will call with any problems that may develop before the next visit here.  I personally spent a total of 30 minutes in the care of the patient today including preparing to see the patient, getting/reviewing separately obtained history, performing a medically appropriate exam/evaluation, counseling and educating, placing orders, referring and communicating with other health care professionals, documenting clinical information in the EHR, independently interpreting results, communicating results, and coordinating  care.   Viinay K Kristin Lamagna, MD 07/22/2024    "

## 2024-07-22 NOTE — Assessment & Plan Note (Signed)
 01/29/20: Bilateral lumpectomies Osker):  Left breast: low grade DCIS involving papilloma, clear margins ER/PR 100%;  Right breast: no evidence of carcinoma   Treatment plan: 1.  Adjuvant radiation therapy 03/08/2020-04/05/2020 2. followed by adjuvant antiestrogen therapy with tamoxifen  x5 years Now on 10 mg every other day We may extend her therapy until June 2026 because for 6 months she did not take tamoxifen  after she started.   Tamoxifen  toxicities:  Breast cancer surveillance: Breast exam 07/21/2024: Benign Mammogram 10/10/23: Benign breast density category C   I will see her in 1 year for breast exam and follow-up.

## 2024-09-03 ENCOUNTER — Ambulatory Visit: Admitting: Family Medicine

## 2024-10-21 ENCOUNTER — Ambulatory Visit: Admitting: Nurse Practitioner
